# Patient Record
Sex: Male | Born: 1935
Health system: Southern US, Community
[De-identification: ages and names within clinical notes are randomized; demographics above are authoritative.]

## PROBLEM LIST (undated history)

## (undated) DIAGNOSIS — M1990 Unspecified osteoarthritis, unspecified site: Secondary | ICD-10-CM

## (undated) DIAGNOSIS — E782 Mixed hyperlipidemia: Secondary | ICD-10-CM

## (undated) DIAGNOSIS — G4730 Sleep apnea, unspecified: Secondary | ICD-10-CM

## (undated) DIAGNOSIS — C649 Malignant neoplasm of unspecified kidney, except renal pelvis: Secondary | ICD-10-CM

## (undated) DIAGNOSIS — C229 Malignant neoplasm of liver, not specified as primary or secondary: Secondary | ICD-10-CM

## (undated) DIAGNOSIS — M545 Low back pain: Secondary | ICD-10-CM

## (undated) DIAGNOSIS — J189 Pneumonia, unspecified organism: Secondary | ICD-10-CM

## (undated) DIAGNOSIS — H9193 Unspecified hearing loss, bilateral: Secondary | ICD-10-CM

## (undated) DIAGNOSIS — G8929 Other chronic pain: Secondary | ICD-10-CM

## (undated) HISTORY — DX: Malignant neoplasm of liver, not specified as primary or secondary: C22.9

## (undated) HISTORY — PX: COLONOSCOPY: SHX174

## (undated) HISTORY — DX: Mixed hyperlipidemia: E78.2

---

## 1978-07-15 HISTORY — PX: NEPHRECTOMY: SHX65

## 1979-06-19 DIAGNOSIS — C649 Malignant neoplasm of unspecified kidney, except renal pelvis: Secondary | ICD-10-CM

## 1979-06-19 DIAGNOSIS — Z85528 Personal history of other malignant neoplasm of kidney: Secondary | ICD-10-CM | POA: Insufficient documentation

## 1979-06-19 HISTORY — DX: Malignant neoplasm of unspecified kidney, except renal pelvis: C64.9

## 1998-06-09 ENCOUNTER — Ambulatory Visit (HOSPITAL_COMMUNITY): Admission: RE | Admit: 1998-06-09 | Discharge: 1998-06-09 | Payer: Self-pay | Admitting: Family Medicine

## 1998-06-09 ENCOUNTER — Encounter: Payer: Self-pay | Admitting: Family Medicine

## 1999-06-20 ENCOUNTER — Encounter: Payer: Self-pay | Admitting: Family Medicine

## 1999-06-20 ENCOUNTER — Ambulatory Visit (HOSPITAL_COMMUNITY): Admission: RE | Admit: 1999-06-20 | Discharge: 1999-06-20 | Payer: Self-pay | Admitting: Family Medicine

## 2000-06-17 ENCOUNTER — Ambulatory Visit (HOSPITAL_BASED_OUTPATIENT_CLINIC_OR_DEPARTMENT_OTHER): Admission: RE | Admit: 2000-06-17 | Discharge: 2000-06-17 | Payer: Self-pay | Admitting: Orthopedic Surgery

## 2000-11-14 ENCOUNTER — Ambulatory Visit (HOSPITAL_COMMUNITY): Admission: RE | Admit: 2000-11-14 | Discharge: 2000-11-14 | Payer: Self-pay | Admitting: Family Medicine

## 2000-11-14 ENCOUNTER — Encounter: Payer: Self-pay | Admitting: Family Medicine

## 2000-11-20 ENCOUNTER — Encounter: Payer: Self-pay | Admitting: Oral and Maxillofacial Surgery

## 2000-11-20 ENCOUNTER — Ambulatory Visit (HOSPITAL_COMMUNITY): Admission: RE | Admit: 2000-11-20 | Discharge: 2000-11-20 | Payer: Self-pay | Admitting: Oral and Maxillofacial Surgery

## 2002-02-04 ENCOUNTER — Encounter: Payer: Self-pay | Admitting: Urology

## 2002-02-04 ENCOUNTER — Encounter: Admission: RE | Admit: 2002-02-04 | Discharge: 2002-02-04 | Payer: Self-pay | Admitting: Urology

## 2002-05-13 ENCOUNTER — Encounter: Payer: Self-pay | Admitting: Family Medicine

## 2002-05-13 ENCOUNTER — Ambulatory Visit (HOSPITAL_COMMUNITY): Admission: RE | Admit: 2002-05-13 | Discharge: 2002-05-13 | Payer: Self-pay | Admitting: Family Medicine

## 2003-01-28 ENCOUNTER — Encounter: Payer: Self-pay | Admitting: Family Medicine

## 2003-01-28 ENCOUNTER — Ambulatory Visit (HOSPITAL_COMMUNITY): Admission: RE | Admit: 2003-01-28 | Discharge: 2003-01-28 | Payer: Self-pay | Admitting: Family Medicine

## 2003-05-31 ENCOUNTER — Ambulatory Visit (HOSPITAL_COMMUNITY): Admission: RE | Admit: 2003-05-31 | Discharge: 2003-05-31 | Payer: Self-pay | Admitting: Family Medicine

## 2004-10-01 ENCOUNTER — Ambulatory Visit (HOSPITAL_COMMUNITY): Admission: RE | Admit: 2004-10-01 | Discharge: 2004-10-01 | Payer: Self-pay | Admitting: Gastroenterology

## 2006-03-06 ENCOUNTER — Emergency Department (HOSPITAL_COMMUNITY): Admission: EM | Admit: 2006-03-06 | Discharge: 2006-03-06 | Payer: Self-pay | Admitting: Emergency Medicine

## 2007-03-10 ENCOUNTER — Ambulatory Visit (HOSPITAL_COMMUNITY): Admission: RE | Admit: 2007-03-10 | Discharge: 2007-03-10 | Payer: Self-pay | Admitting: Family Medicine

## 2007-06-18 HISTORY — PX: OPEN PARTIAL HEPATECTOMY [83]: SHX5987

## 2008-12-15 ENCOUNTER — Encounter: Admission: RE | Admit: 2008-12-15 | Discharge: 2008-12-15 | Payer: Self-pay | Admitting: Family Medicine

## 2009-04-27 ENCOUNTER — Encounter: Admission: RE | Admit: 2009-04-27 | Discharge: 2009-04-27 | Payer: Self-pay | Admitting: Orthopedic Surgery

## 2010-08-05 ENCOUNTER — Encounter: Payer: Self-pay | Admitting: Orthopedic Surgery

## 2010-08-29 HISTORY — PX: OPEN PARTIAL HEPATECTOMY [83]: SHX5987

## 2010-10-13 ENCOUNTER — Emergency Department (HOSPITAL_BASED_OUTPATIENT_CLINIC_OR_DEPARTMENT_OTHER)
Admission: EM | Admit: 2010-10-13 | Discharge: 2010-10-13 | Disposition: A | Discharge status: Home / Self Care | Payer: Medicare Other | Attending: Emergency Medicine | Admitting: Emergency Medicine

## 2010-10-13 DIAGNOSIS — E785 Hyperlipidemia, unspecified: Secondary | ICD-10-CM | POA: Insufficient documentation

## 2010-10-13 DIAGNOSIS — Z79899 Other long term (current) drug therapy: Secondary | ICD-10-CM | POA: Insufficient documentation

## 2010-10-13 DIAGNOSIS — H81399 Other peripheral vertigo, unspecified ear: Secondary | ICD-10-CM | POA: Insufficient documentation

## 2010-11-30 NOTE — Op Note (Signed)
NAMESHAVON, ZENZ              ACCOUNT NO.:  [id number]   MEDICAL RECORD NO.:  [id number]          PATIENT TYPE:  AMB   LOCATION:  ENDO                         FACILITY:  [facility]   PHYSICIAN:  John C. Madilyn Fireman, M.D.    DATE OF BIRTH:  1924/01/19   DATE OF PROCEDURE:  10/01/2004  DATE OF DISCHARGE:                                 OPERATIVE REPORT   PROCEDURE:  Colonoscopy.   INDICATIONS FOR PROCEDURE:  Average risk colon cancer screening.   DESCRIPTION OF PROCEDURE:  The patient was placed in the left lateral  decubitus position and placed on the pulse monitor with continuous low flow  oxygen delivered by nasal cannula.  He was sedated with 62.5 mcg of IV  Fentanyl and 8 mg of IV Versed.  The Olympus videocolonoscope was inserted  into the rectum and advanced to the cecum, confirmed by transillumination of  McBurney's point and visualization of the ileocecal valve and appendiceal  orifice.  The prep was good.  The cecum, ascending, transverse, descending  and sigmoid colon all appeared normal, with no masses, polyps, diverticula,  or other mucosal abnormalities.  The rectum, likewise, appeared normal.  Retroflex view of the anus revealed no obvious internal hemorrhoids.  The  scope was then withdrawn and the patient returned to the recovery room in  stable condition.  He tolerated the procedure well, and there were no  immediate complications.   IMPRESSION:  Normal colonoscopy.   PLAN:  Consider flexible sigmoidoscopy or Hemoccults in five years with  possible colonoscopy in 10 years.      [facility]MED[facility]  D:  10/01/2004  T:  10/01/2004  Job:  [id number]   cc:   Meredith Staggers, M.D.  [address]  [place]  [place] [address]  Fax: [phone]

## 2011-08-21 DIAGNOSIS — E7800 Pure hypercholesterolemia, unspecified: Secondary | ICD-10-CM | POA: Diagnosis not present

## 2011-10-18 DIAGNOSIS — C228 Malignant neoplasm of liver, primary, unspecified as to type: Secondary | ICD-10-CM | POA: Insufficient documentation

## 2011-10-18 DIAGNOSIS — R918 Other nonspecific abnormal finding of lung field: Secondary | ICD-10-CM | POA: Diagnosis not present

## 2011-11-27 DIAGNOSIS — H2510 Age-related nuclear cataract, unspecified eye: Secondary | ICD-10-CM | POA: Diagnosis not present

## 2011-11-27 DIAGNOSIS — H52209 Unspecified astigmatism, unspecified eye: Secondary | ICD-10-CM | POA: Diagnosis not present

## 2011-11-27 DIAGNOSIS — H524 Presbyopia: Secondary | ICD-10-CM | POA: Diagnosis not present

## 2011-11-27 DIAGNOSIS — H02409 Unspecified ptosis of unspecified eyelid: Secondary | ICD-10-CM | POA: Diagnosis not present

## 2012-02-03 DIAGNOSIS — M719 Bursopathy, unspecified: Secondary | ICD-10-CM | POA: Diagnosis not present

## 2012-04-24 DIAGNOSIS — I708 Atherosclerosis of other arteries: Secondary | ICD-10-CM | POA: Diagnosis not present

## 2012-04-24 DIAGNOSIS — Z905 Acquired absence of kidney: Secondary | ICD-10-CM | POA: Diagnosis not present

## 2012-04-24 DIAGNOSIS — C228 Malignant neoplasm of liver, primary, unspecified as to type: Secondary | ICD-10-CM | POA: Diagnosis not present

## 2012-04-24 DIAGNOSIS — R918 Other nonspecific abnormal finding of lung field: Secondary | ICD-10-CM | POA: Diagnosis not present

## 2012-06-17 DIAGNOSIS — Z23 Encounter for immunization: Secondary | ICD-10-CM | POA: Diagnosis not present

## 2012-06-19 DIAGNOSIS — N400 Enlarged prostate without lower urinary tract symptoms: Secondary | ICD-10-CM | POA: Diagnosis not present

## 2012-06-19 DIAGNOSIS — R351 Nocturia: Secondary | ICD-10-CM | POA: Diagnosis not present

## 2012-08-04 DIAGNOSIS — C229 Malignant neoplasm of liver, not specified as primary or secondary: Secondary | ICD-10-CM | POA: Diagnosis not present

## 2012-08-04 DIAGNOSIS — T6391XA Toxic effect of contact with unspecified venomous animal, accidental (unintentional), initial encounter: Secondary | ICD-10-CM | POA: Diagnosis not present

## 2012-08-04 DIAGNOSIS — E782 Mixed hyperlipidemia: Secondary | ICD-10-CM | POA: Diagnosis not present

## 2012-08-04 DIAGNOSIS — N400 Enlarged prostate without lower urinary tract symptoms: Secondary | ICD-10-CM | POA: Diagnosis not present

## 2012-08-19 DIAGNOSIS — M25579 Pain in unspecified ankle and joints of unspecified foot: Secondary | ICD-10-CM | POA: Diagnosis not present

## 2012-09-07 DIAGNOSIS — J069 Acute upper respiratory infection, unspecified: Secondary | ICD-10-CM | POA: Diagnosis not present

## 2012-09-12 DIAGNOSIS — J069 Acute upper respiratory infection, unspecified: Secondary | ICD-10-CM | POA: Diagnosis not present

## 2012-09-12 DIAGNOSIS — R05 Cough: Secondary | ICD-10-CM | POA: Diagnosis not present

## 2012-09-15 DIAGNOSIS — J209 Acute bronchitis, unspecified: Secondary | ICD-10-CM | POA: Diagnosis not present

## 2012-09-21 DIAGNOSIS — K5900 Constipation, unspecified: Secondary | ICD-10-CM | POA: Diagnosis not present

## 2012-09-21 DIAGNOSIS — J209 Acute bronchitis, unspecified: Secondary | ICD-10-CM | POA: Diagnosis not present

## 2012-10-01 DIAGNOSIS — J209 Acute bronchitis, unspecified: Secondary | ICD-10-CM | POA: Diagnosis not present

## 2012-10-01 DIAGNOSIS — R05 Cough: Secondary | ICD-10-CM | POA: Diagnosis not present

## 2012-10-15 DIAGNOSIS — M8430XA Stress fracture, unspecified site, initial encounter for fracture: Secondary | ICD-10-CM | POA: Diagnosis not present

## 2012-10-15 DIAGNOSIS — M19079 Primary osteoarthritis, unspecified ankle and foot: Secondary | ICD-10-CM | POA: Diagnosis not present

## 2012-10-20 DIAGNOSIS — M84376A Stress fracture, unspecified foot, initial encounter for fracture: Secondary | ICD-10-CM | POA: Diagnosis not present

## 2012-10-20 DIAGNOSIS — M25579 Pain in unspecified ankle and joints of unspecified foot: Secondary | ICD-10-CM | POA: Diagnosis not present

## 2012-10-26 ENCOUNTER — Telehealth: Payer: Self-pay | Admitting: Pulmonary Disease

## 2012-10-26 NOTE — Telephone Encounter (Signed)
Spoke with Crystal and ok to place pt in 2:15 sot for this Thursday in [place] with PW and block 2:45 for catch up. Pt is aware. appt set. Carron Curie, CMA

## 2012-10-29 ENCOUNTER — Encounter: Payer: Self-pay | Admitting: Critical Care Medicine

## 2012-10-29 ENCOUNTER — Ambulatory Visit (INDEPENDENT_AMBULATORY_CARE_PROVIDER_SITE_OTHER): Payer: Medicare Other | Admitting: Critical Care Medicine

## 2012-10-29 VITALS — BP 140/84 | HR 60 | Temp 98.1°F | Ht 69.0 in | Wt 150.5 lb

## 2012-10-29 DIAGNOSIS — C229 Malignant neoplasm of liver, not specified as primary or secondary: Secondary | ICD-10-CM | POA: Diagnosis not present

## 2012-10-29 DIAGNOSIS — R05 Cough: Secondary | ICD-10-CM | POA: Diagnosis not present

## 2012-10-29 DIAGNOSIS — R059 Cough, unspecified: Secondary | ICD-10-CM | POA: Insufficient documentation

## 2012-10-29 DIAGNOSIS — Z85528 Personal history of other malignant neoplasm of kidney: Secondary | ICD-10-CM

## 2012-10-29 DIAGNOSIS — E782 Mixed hyperlipidemia: Secondary | ICD-10-CM | POA: Insufficient documentation

## 2012-10-29 MED ORDER — BENZONATATE 100 MG PO CAPS: 100.0000 mg | ORAL_CAPSULE | Freq: Three times a day (TID) | ORAL | Status: DC | PRN

## 2012-10-29 MED ORDER — HYDROCOD POLST-CPM POLST ER 10-8 MG PO CP12: 1.0000 | ORAL_CAPSULE | Freq: Two times a day (BID) | ORAL | Status: DC | PRN

## 2012-10-29 MED ORDER — FLUTICASONE PROPIONATE 50 MCG/ACT NA SUSP: 2.0000 | Freq: Every day | NASAL | Status: DC

## 2012-10-29 MED ORDER — CHLORPHENIRAMINE TANNATE 12 MG PO TABS: 12.0000 mg | ORAL_TABLET | Freq: Every day | ORAL | Status: DC

## 2012-10-29 MED ORDER — PREDNISONE 10 MG PO TABS: ORAL_TABLET | ORAL | Status: DC

## 2012-10-29 NOTE — Progress Notes (Addendum)
Subjective:    Patient ID: Corey Davila, male    DOB: 05/14/1924, 79 y.o.   MRN: [id number]  HPI Comments: Started coughing 09/12/12.  Cough is productive of clear.    Cough This is a new problem. The current episode started more than 1 month ago. The problem has been unchanged. The cough is productive of sputum (clear mucus.). Associated symptoms include nasal congestion, postnasal drip and rhinorrhea. Pertinent negatives include no chest pain, chills, ear congestion, ear pain, fever, headaches, heartburn, myalgias, rash, sore throat, shortness of breath, sweats, weight loss or wheezing. He has tried prescription cough suppressant for the symptoms. The treatment provided moderate relief. There is no history of asthma, bronchiectasis, bronchitis, COPD, emphysema, environmental allergies or pneumonia.    Past Medical History  Diagnosis Date  . Liver cancer     partial liver resection 06/18/2007; partial liver resection 08/29/2010, not renal  . History of kidney cancer 06/19/1979    L renal taken out   . Mixed hyperlipidemia   . Cough      Family History  Problem Relation Age of Onset  . Stroke Father      History   Social History  . Marital Status: Married    Spouse Name: N/A    Number of Children: N/A  . Years of Education: N/A   Occupational History  . Retired     Solicitor for [employer]   Social History Main Topics  . Smoking status: Never Smoker   . Smokeless tobacco: Never Used  . Alcohol Use: Not on file  . Drug Use: Not on file  . Sexually Active: Not on file   Other Topics Concern  . Not on file   Social History Narrative  . No narrative on file     Allergies  Allergen Reactions  . Bee Venom   . Dilantin (Phenytoin)   . Mevacor (Lovastatin)   . Niacin And Related     Rash   . Septra Ds (Sulfamethoxazole W-Trimethoprim)      No outpatient prescriptions prior to visit.   No facility-administered medications prior to visit.      Review of  Systems  Constitutional: Positive for diaphoresis and activity change. Negative for fever, chills, weight loss, appetite change, fatigue and unexpected weight change.  HENT: Positive for congestion, rhinorrhea, sneezing, mouth sores, voice change and postnasal drip. Negative for hearing loss, ear pain, nosebleeds, sore throat, facial swelling, trouble swallowing, neck pain, neck stiffness, dental problem, sinus pressure, tinnitus and ear discharge.   Eyes: Negative for photophobia, discharge, itching and visual disturbance.  Respiratory: Positive for cough and choking. Negative for apnea, chest tightness, shortness of breath, wheezing and stridor.        Will choke with coughing  Cardiovascular: Negative for chest pain, palpitations and leg swelling.  Gastrointestinal: Negative for heartburn, nausea, vomiting, abdominal pain, constipation, blood in stool and abdominal distention.  Genitourinary: Negative for dysuria, urgency, frequency, hematuria, flank pain, decreased urine volume and difficulty urinating.  Musculoskeletal: Negative for myalgias, back pain, joint swelling, arthralgias and gait problem.  Skin: Negative for color change, pallor and rash.  Allergic/Immunologic: Negative for environmental allergies.  Neurological: Negative for dizziness, tremors, seizures, syncope, speech difficulty, weakness, light-headedness, numbness and headaches.  Hematological: Negative for adenopathy. Does not bruise/bleed easily.  Psychiatric/Behavioral: Positive for sleep disturbance. Negative for confusion and agitation. The patient is not nervous/anxious.        Objective:   Physical Exam Filed Vitals:   10/29/12  1419  BP: 140/84  Pulse: 60  Temp: 98.1 F (36.7 C)  TempSrc: Oral  Height: 5\' 9"  (1.753 m)  Weight: 150 lb 8 oz (68.266 kg)  SpO2: 94%    Gen: Pleasant, well-nourished, in no distress,  normal affect  ENT: No lesions,  mouth clear,  oropharynx clear, ++++ postnasal drip  Neck: No  JVD, no TMG, no carotid bruits  Lungs: No use of accessory muscles, no dullness to percussion, clear without rales or rhonchi  Cardiovascular: RRR, heart sounds normal, no murmur or gallops, no peripheral edema  Abdomen: soft and NT, no HSM,  BS normal  Musculoskeletal: No deformities, no cyanosis or clubbing  Neuro: alert, non focal  Skin: Warm, no lesions or rashes  No results found.   Cleda Daub normal 10/29/12     Assessment & Plan:   Cough Cyclical cough on basis of GERD and postnasal drip syndrome without any primary lung pathology. Note CXR neg for any metastatic disease with hx of renal CA an liver CA PLAN Take prednisone 10mg  Take 4 for two days three for two days two for two days one for two days Follow cough protocol with tussicaps and benzonatate Follow reflux diet No other medication changes Return 6 weeks     Updated Medication List Outpatient Encounter Prescriptions as of 10/29/2012  Medication Sig Dispense Refill  . CRESTOR 20 MG tablet Take 1 tablet by mouth every other day.      . docusate sodium (COLACE) 100 MG capsule Take 100 mg by mouth daily.      . finasteride (PROSCAR) 5 MG tablet Take 1 tablet by mouth daily.      . fluticasone (FLONASE) 50 MCG/ACT nasal spray Place 2 sprays into the nose at bedtime.  16 g  6  . Milk Thistle 250 MG CAPS Take 1 capsule by mouth daily.      . Misc Natural Products (OSTEO BI-FLEX JOINT SHIELD PO) Take 1 tablet by mouth 2 (two) times daily.      . Multiple Minerals-Vitamins (CALCIUM CITRATE PLUS/MAGNESIUM PO) Take 1 tablet by mouth daily.      . Multiple Vitamin (MULTIVITAMIN) tablet Take 1 tablet by mouth daily.      . [DISCONTINUED] dextromethorphan-guaiFENesin (MUCINEX DM) 30-600 MG per 12 hr tablet Take 1 tablet by mouth every 12 (twelve) hours.      . [DISCONTINUED] fluticasone (FLONASE) 50 MCG/ACT nasal spray Place 2 sprays into the nose at bedtime.      . benzonatate (TESSALON) 100 MG capsule Take 1 capsule (100 mg  total) by mouth 3 (three) times daily as needed for cough. Take 1 -2 every 4 hours as needed for cough  60 capsule  6  . Chlorpheniramine Tannate 12 MG TABS Take 1 tablet (12 mg total) by mouth at bedtime.  30 each  6  . Hydrocod Polst-Chlorphen Polst (TUSSICAPS) 10-8 MG CP12 Take 1 capsule by mouth 2 (two) times daily as needed.  30 each  1  . predniSONE (DELTASONE) 10 MG tablet Take 4 for two days three for two days two for two days one for two days  20 tablet  0   No facility-administered encounter medications on file as of 10/29/2012.

## 2012-10-29 NOTE — Patient Instructions (Addendum)
Take prednisone 10mg  Take 4 for two days three for two days two for two days one for two days Follow cough protocol with tussicaps and benzonatate Follow reflux diet No other medication changes We are trying to find your chest xray for review, it has not been seen as of this visit by Dr Delford Field Return 6 weeks

## 2012-10-30 NOTE — Assessment & Plan Note (Addendum)
Cyclical cough on basis of GERD and postnasal drip syndrome without any primary lung pathology. Note CXR neg for any metastatic disease with hx of renal CA an liver CA PLAN Take prednisone 10mg  Take 4 for two days three for two days two for two days one for two days Follow cough protocol with tussicaps and benzonatate Follow reflux diet No other medication changes Return 6 weeks

## 2012-11-02 ENCOUNTER — Telehealth: Payer: Self-pay | Admitting: Specialist

## 2012-11-02 ENCOUNTER — Telehealth: Payer: Self-pay | Admitting: Critical Care Medicine

## 2012-11-02 NOTE — Telephone Encounter (Signed)
Spoke with pt and his spouse They did not know which med to take first- the tussi caps or the tessalon I advised them to refer to the cyclical cough protocol sheet given at ov She verbalized understanding and nothing further needed They understood the sheet PW gave them

## 2012-11-02 NOTE — Telephone Encounter (Signed)
Made in error. Corey Davila  °

## 2012-11-03 ENCOUNTER — Institutional Professional Consult (permissible substitution): Payer: Medicare Other | Admitting: Pulmonary Disease

## 2012-11-10 DIAGNOSIS — C228 Malignant neoplasm of liver, primary, unspecified as to type: Secondary | ICD-10-CM | POA: Diagnosis not present

## 2012-11-10 DIAGNOSIS — Z0389 Encounter for observation for other suspected diseases and conditions ruled out: Secondary | ICD-10-CM | POA: Diagnosis not present

## 2012-11-10 DIAGNOSIS — R918 Other nonspecific abnormal finding of lung field: Secondary | ICD-10-CM | POA: Diagnosis not present

## 2012-11-26 ENCOUNTER — Encounter: Payer: Self-pay | Admitting: Critical Care Medicine

## 2012-11-26 ENCOUNTER — Ambulatory Visit (INDEPENDENT_AMBULATORY_CARE_PROVIDER_SITE_OTHER): Payer: Medicare Other | Admitting: Critical Care Medicine

## 2012-11-26 VITALS — BP 118/70 | HR 84 | Temp 100.7°F | Ht 69.0 in | Wt 149.0 lb

## 2012-11-26 DIAGNOSIS — R05 Cough: Secondary | ICD-10-CM

## 2012-11-26 MED ORDER — CHLORPHENIRAMINE TANNATE 12 MG PO TABS: ORAL_TABLET | ORAL | Status: DC

## 2012-11-26 MED ORDER — METHYLPREDNISOLONE ACETATE 80 MG/ML IJ SUSP
120.0000 mg | Freq: Once | INTRAMUSCULAR | Status: AC
  Administered 2012-11-26: 120 mg via INTRAMUSCULAR

## 2012-11-26 MED ORDER — HYDROCOD POLST-CPM POLST ER 10-8 MG PO CP12: 1.0000 | ORAL_CAPSULE | Freq: Two times a day (BID) | ORAL | Status: DC | PRN

## 2012-11-26 MED ORDER — BENZONATATE 100 MG PO CAPS: ORAL_CAPSULE | ORAL | Status: DC

## 2012-11-26 MED ORDER — FLUTICASONE PROPIONATE 50 MCG/ACT NA SUSP: NASAL | Status: DC

## 2012-11-26 NOTE — Patient Instructions (Addendum)
REsume cough protocol with tussicaps and tessalon perles Keep sugar free lozenge in mouth at all times No talking for two days, except emergency Resume chlorpheniramine and flonase>>do not stop A depomedrol injection was given All meds refilled to downstairs pharmacy Return 2 months

## 2012-11-26 NOTE — Assessment & Plan Note (Signed)
Cyclical cough on the basis of reflux disease and postnasal drip syndrome with acute allergic rhinitis No primary lung disease Plan REsume cough protocol with tussicaps and tessalon perles Keep sugar free lozenge in mouth at all times No talking for two days, except emergency Resume chlorpheniramine and flonase>>do not stop A depomedrol injection was given

## 2012-11-26 NOTE — Progress Notes (Signed)
Subjective:    Patient ID: Corey Davila, male    DOB: 08/24/1923, 79 y.o.   MRN: [id number]  HPI Comments: Started coughing 09/12/12.  Cough is productive of clear.    Cough This is a new problem. The current episode started more than 1 month ago. The problem has been gradually worsening. The cough is productive of sputum (clear mucus.). Associated symptoms include nasal congestion, postnasal drip and rhinorrhea. Pertinent negatives include no chest pain, chills, ear congestion, ear pain, fever, headaches, heartburn, hemoptysis, myalgias, rash, sore throat, shortness of breath, sweats, weight loss or wheezing. The symptoms are aggravated by lying down and pollens. He has tried prescription cough suppressant for the symptoms. The treatment provided moderate relief. There is no history of asthma, bronchiectasis, bronchitis, COPD, emphysema, environmental allergies or pneumonia.   11/26/2012 Cough was better with protocol then three days ago worse Now : cough is productive of phlegm.  Clear. No dyspnea. Notes qhs cough.  No heartburn    Past Medical History  Diagnosis Date  . Liver cancer     partial liver resection 06/18/2007; partial liver resection 08/29/2010, not renal  . History of kidney cancer 06/19/1979    L renal taken out   . Mixed hyperlipidemia   . Cough      Family History  Problem Relation Age of Onset  . Stroke Father      History   Social History  . Marital Status: Married    Spouse Name: N/A    Number of Children: N/A  . Years of Education: N/A   Occupational History  . Retired     Solicitor for [employer]   Social History Main Topics  . Smoking status: Never Smoker   . Smokeless tobacco: Never Used  . Alcohol Use: Not on file  . Drug Use: Not on file  . Sexually Active: Not on file   Other Topics Concern  . Not on file   Social History Narrative  . No narrative on file     Allergies  Allergen Reactions  . Bee Venom   . Dilantin (Phenytoin)    . Mevacor (Lovastatin)   . Niacin And Related     Rash   . Septra Ds (Sulfamethoxazole W-Trimethoprim)      Outpatient Prescriptions Prior to Visit  Medication Sig Dispense Refill  . CRESTOR 20 MG tablet Take 1 tablet by mouth every other day.      . docusate sodium (COLACE) 100 MG capsule Take 100 mg by mouth daily.      . finasteride (PROSCAR) 5 MG tablet Take 1 tablet by mouth daily.      . Milk Thistle 250 MG CAPS Take 1 capsule by mouth daily.      . Misc Natural Products (OSTEO BI-FLEX JOINT SHIELD PO) Take 1 tablet by mouth 2 (two) times daily.      . Multiple Minerals-Vitamins (CALCIUM CITRATE PLUS/MAGNESIUM PO) Take 1 tablet by mouth daily.      . Multiple Vitamin (MULTIVITAMIN) tablet Take 1 tablet by mouth daily.      . benzonatate (TESSALON) 100 MG capsule Take 1 capsule (100 mg total) by mouth 3 (three) times daily as needed for cough. Take 1 -2 every 4 hours as needed for cough  60 capsule  6  . Chlorpheniramine Tannate 12 MG TABS Take 1 tablet (12 mg total) by mouth at bedtime.  30 each  6  . fluticasone (FLONASE) 50 MCG/ACT nasal spray Place 2 sprays  into the nose at bedtime.  16 g  6  . Hydrocod Polst-Chlorphen Polst (TUSSICAPS) 10-8 MG CP12 Take 1 capsule by mouth 2 (two) times daily as needed.  30 each  1  . predniSONE (DELTASONE) 10 MG tablet Take 4 for two days three for two days two for two days one for two days  20 tablet  0   No facility-administered medications prior to visit.      Review of Systems  Constitutional: Positive for diaphoresis and activity change. Negative for fever, chills, weight loss, appetite change, fatigue and unexpected weight change.  HENT: Positive for congestion, rhinorrhea, sneezing, mouth sores, voice change and postnasal drip. Negative for hearing loss, ear pain, nosebleeds, sore throat, facial swelling, trouble swallowing, neck pain, neck stiffness, dental problem, sinus pressure, tinnitus and ear discharge.   Eyes: Negative for  photophobia, discharge, itching and visual disturbance.  Respiratory: Positive for cough and choking. Negative for apnea, hemoptysis, chest tightness, shortness of breath, wheezing and stridor.        Will choke with coughing  Cardiovascular: Negative for chest pain, palpitations and leg swelling.  Gastrointestinal: Negative for heartburn, nausea, vomiting, abdominal pain, constipation, blood in stool and abdominal distention.  Genitourinary: Negative for dysuria, urgency, frequency, hematuria, flank pain, decreased urine volume and difficulty urinating.  Musculoskeletal: Negative for myalgias, back pain, joint swelling, arthralgias and gait problem.  Skin: Negative for color change, pallor and rash.  Allergic/Immunologic: Negative for environmental allergies.  Neurological: Negative for dizziness, tremors, seizures, syncope, speech difficulty, weakness, light-headedness, numbness and headaches.  Hematological: Negative for adenopathy. Does not bruise/bleed easily.  Psychiatric/Behavioral: Positive for sleep disturbance. Negative for confusion and agitation. The patient is not nervous/anxious.        Objective:   Physical Exam  Filed Vitals:   11/26/12 1042  BP: 118/70  Pulse: 84  Temp: 100.7 F (38.2 C)  TempSrc: Oral  Height: 5\' 9"  (1.753 m)  Weight: 149 lb (67.586 kg)  SpO2: 96%    Gen: Pleasant, well-nourished, in no distress,  normal affect  ENT: No lesions,  mouth clear,  oropharynx clear, ++++ postnasal drip  Neck: No JVD, no TMG, no carotid bruits  Lungs: No use of accessory muscles, no dullness to percussion, clear without rales or rhonchi  Cardiovascular: RRR, heart sounds normal, no murmur or gallops, no peripheral edema  Abdomen: soft and NT, no HSM,  BS normal  Musculoskeletal: No deformities, no cyanosis or clubbing  Neuro: alert, non focal  Skin: Warm, no lesions or rashes  No results found.      Assessment & Plan:   Cough Cyclical cough on the  basis of reflux disease and postnasal drip syndrome with acute allergic rhinitis No primary lung disease Plan REsume cough protocol with tussicaps and tessalon perles Keep sugar free lozenge in mouth at all times No talking for two days, except emergency Resume chlorpheniramine and flonase>>do not stop A depomedrol injection was given     Updated Medication List Outpatient Encounter Prescriptions as of 11/26/2012  Medication Sig Dispense Refill  . CRESTOR 20 MG tablet Take 1 tablet by mouth every other day.      . desmopressin (DDAVP) 0.2 MG tablet Take 0.5 tablets by mouth at bedtime.      . docusate sodium (COLACE) 100 MG capsule Take 100 mg by mouth daily.      . finasteride (PROSCAR) 5 MG tablet Take 1 tablet by mouth daily.      . Milk Thistle 250  MG CAPS Take 1 capsule by mouth daily.      . Misc Natural Products (OSTEO BI-FLEX JOINT SHIELD PO) Take 1 tablet by mouth 2 (two) times daily.      . Multiple Minerals-Vitamins (CALCIUM CITRATE PLUS/MAGNESIUM PO) Take 1 tablet by mouth daily.      . Multiple Vitamin (MULTIVITAMIN) tablet Take 1 tablet by mouth daily.      . benzonatate (TESSALON) 100 MG capsule Take 1 -2 every 4 hours as needed for cough RESUME  60 capsule  6  . Chlorpheniramine Tannate 12 MG TABS RESUME  30 each  6  . fluticasone (FLONASE) 50 MCG/ACT nasal spray RESUME  16 g  6  . Hydrocod Polst-Chlorphen Polst (TUSSICAPS) 10-8 MG CP12 Take 1 capsule by mouth 2 (two) times daily as needed.  30 each  1  . [DISCONTINUED] benzonatate (TESSALON) 100 MG capsule Take 1 capsule (100 mg total) by mouth 3 (three) times daily as needed for cough. Take 1 -2 every 4 hours as needed for cough  60 capsule  6  . [DISCONTINUED] Chlorpheniramine Tannate 12 MG TABS Take 1 tablet (12 mg total) by mouth at bedtime.  30 each  6  . [DISCONTINUED] fluticasone (FLONASE) 50 MCG/ACT nasal spray Place 2 sprays into the nose at bedtime.  16 g  6  . [DISCONTINUED] Hydrocod Polst-Chlorphen Polst  (TUSSICAPS) 10-8 MG CP12 Take 1 capsule by mouth 2 (two) times daily as needed.  30 each  1  . [DISCONTINUED] predniSONE (DELTASONE) 10 MG tablet Take 4 for two days three for two days two for two days one for two days  20 tablet  0  . [EXPIRED] methylPREDNISolone acetate (DEPO-MEDROL) injection 120 mg        No facility-administered encounter medications on file as of 11/26/2012.

## 2012-12-01 ENCOUNTER — Telehealth: Payer: Self-pay | Admitting: Critical Care Medicine

## 2012-12-01 NOTE — Telephone Encounter (Signed)
Called spoke with Julianne Rice of PW's recs as stated below Kendal Hymen verbalized her understanding Ov scheduled w/ TP to regroup per PW on 5.22.14 @ 0930 Nothing further needed at this time Will sign and forward to PW as FYI on upcoming appt w/ TP

## 2012-12-01 NOTE — Telephone Encounter (Signed)
Pt's daughter returning call can be reached at [phone].Corey Davila

## 2012-12-01 NOTE — Telephone Encounter (Signed)
noted 

## 2012-12-01 NOTE — Telephone Encounter (Signed)
Called spoke with Kendal Hymen who reports that Fri 5.16.14 @ 5am pt was given 2 tessalon perles.  Pt got up again at 7am and took 2 more tessalon perles and had some notable dizziness afterward to where he had to sit down on the bathroom floor.  Tussicaps were also given that day according to the cough protocol.  Pt was "out of it Friday and Saturday" sleeping every couple of hours and having to use a walking stick d/t to weakness/dizziness/fuzziness.  Kendal Hymen stated that he is not coughing as much, though it is still a hard cough to where has to hug a pillow.  Pt only took 2 tessalon perles yesterday morning at 5am, stating that "he just can't do this" and does like how the medications make him feel.  Kendal Hymen denies pt having any HA, blurred vision, slurred speech, difficulty swallowing, CP.  Pt is eating well, 3x per day.  Dr Delford Field please advise, thanks! [facility] Allergies  Allergen Reactions  . Bee Venom   . Dilantin (Phenytoin)   . Mevacor (Lovastatin)   . Niacin And Related     Rash   . Septra Ds (Sulfamethoxazole W-Trimethoprim)

## 2012-12-01 NOTE — Telephone Encounter (Signed)
lmomtcb x1 for pt 

## 2012-12-01 NOTE — Telephone Encounter (Signed)
This is side effects d/t tessalon and tussicaps Stop both and do not restart We need ov to regroup with tammy parrett

## 2012-12-01 NOTE — Telephone Encounter (Signed)
If unable to reach Corey Davila--call other daughter Corey Davila--[phone]

## 2012-12-03 ENCOUNTER — Ambulatory Visit (INDEPENDENT_AMBULATORY_CARE_PROVIDER_SITE_OTHER): Payer: Medicare Other | Admitting: Critical Care Medicine

## 2012-12-03 ENCOUNTER — Ambulatory Visit (HOSPITAL_BASED_OUTPATIENT_CLINIC_OR_DEPARTMENT_OTHER)
Admission: RE | Admit: 2012-12-03 | Discharge: 2012-12-03 | Disposition: A | Discharge status: Home / Self Care | Payer: Medicare Other | Source: Ambulatory Visit | Attending: Critical Care Medicine | Admitting: Critical Care Medicine

## 2012-12-03 ENCOUNTER — Encounter: Payer: Self-pay | Admitting: Critical Care Medicine

## 2012-12-03 ENCOUNTER — Other Ambulatory Visit: Payer: Self-pay | Admitting: Critical Care Medicine

## 2012-12-03 ENCOUNTER — Ambulatory Visit: Payer: Medicare Other | Admitting: Adult Health

## 2012-12-03 VITALS — BP 142/86 | HR 77 | Temp 98.2°F | Ht 69.0 in | Wt 142.5 lb

## 2012-12-03 DIAGNOSIS — T17908A Unspecified foreign body in respiratory tract, part unspecified causing other injury, initial encounter: Secondary | ICD-10-CM

## 2012-12-03 DIAGNOSIS — C787 Secondary malignant neoplasm of liver and intrahepatic bile duct: Secondary | ICD-10-CM | POA: Insufficient documentation

## 2012-12-03 DIAGNOSIS — J690 Pneumonitis due to inhalation of food and vomit: Secondary | ICD-10-CM | POA: Insufficient documentation

## 2012-12-03 DIAGNOSIS — Z85528 Personal history of other malignant neoplasm of kidney: Secondary | ICD-10-CM | POA: Diagnosis not present

## 2012-12-03 DIAGNOSIS — R059 Cough, unspecified: Secondary | ICD-10-CM | POA: Insufficient documentation

## 2012-12-03 DIAGNOSIS — R05 Cough: Secondary | ICD-10-CM | POA: Diagnosis not present

## 2012-12-03 DIAGNOSIS — C229 Malignant neoplasm of liver, not specified as primary or secondary: Secondary | ICD-10-CM | POA: Diagnosis not present

## 2012-12-03 LAB — CBC WITH DIFFERENTIAL/PLATELET
Basophils Relative: 0 % (ref 0–1)
HCT: 40.5 % (ref 39.0–52.0)
Hemoglobin: 14 g/dL (ref 13.0–17.0)
MCHC: 34.6 g/dL (ref 30.0–36.0)
MCV: 94.6 fL (ref 78.0–100.0)
Monocytes Absolute: 1.2 10*3/uL — ABNORMAL HIGH (ref 0.1–1.0)
Monocytes Relative: 11 % (ref 3–12)
Neutro Abs: 8.5 10*3/uL — ABNORMAL HIGH (ref 1.7–7.7)

## 2012-12-03 MED ORDER — CEFUROXIME AXETIL 500 MG PO TABS: 500.0000 mg | ORAL_TABLET | Freq: Two times a day (BID) | ORAL | Status: DC

## 2012-12-03 MED ORDER — ALBUTEROL SULFATE HFA 108 (90 BASE) MCG/ACT IN AERS: INHALATION_SPRAY | RESPIRATORY_TRACT | Status: DC

## 2012-12-03 MED ORDER — FLUTTER DEVI: Status: DC

## 2012-12-03 NOTE — Assessment & Plan Note (Signed)
Cyclical cough on the basis of postnasal drip syndrome and reflux disease but adverse side effects do to narcotic based cough suppressants Plan will be to discontinue all further cough suppressants

## 2012-12-03 NOTE — Patient Instructions (Addendum)
Use flutter valve three times daily Take Ceftin twice daily for 10days Labs today STOP tussicaps, tessalon/benzonatate, chlorpheniramine Use albuterol inhaler two puff 3-4 times daily for 24 hours then as needed Return next Tuesday for recheck and Chest xray with tammy parrett in [place]

## 2012-12-03 NOTE — Progress Notes (Signed)
Subjective:    Patient ID: Corey Davila, male    DOB: 1923/07/25, 79 y.o.   MRN: [id number]  HPI Comments: Started coughing 09/12/12.  Cough is productive of clear.    12/03/2012 This patient was seen on May 14 with recurrent cough. He cyclic cough protocol resumed. The patient noted while on Tussi caps and benzonatate the patient became more drowsy and weak. The patient's appetite is poor. His secretion handling was difficult. There was increased nasal congestion. The mucus he is coughing up is clear. The cough itself is actually improved. He is having some night sweats. There is decreased endurance. There is no fever.  With history of liver cancer recent visit [facility] did not demonstrate recurrence of malignancy on CT scan.    Past Medical History  Diagnosis Date  . Liver cancer     partial liver resection 06/18/2007; partial liver resection 08/29/2010, not renal  . History of kidney cancer 06/19/1979    L renal taken out   . Mixed hyperlipidemia   . Cough      Family History  Problem Relation Age of Onset  . Stroke Father      History   Social History  . Marital Status: Married    Spouse Name: N/A    Number of Children: N/A  . Years of Education: N/A   Occupational History  . Retired     Solicitor for [employer]   Social History Main Topics  . Smoking status: Never Smoker   . Smokeless tobacco: Never Used  . Alcohol Use: Not on file  . Drug Use: Not on file  . Sexually Active: Not on file   Other Topics Concern  . Not on file   Social History Narrative  . No narrative on file     Allergies  Allergen Reactions  . Tessalon (Benzonatate)   . Tussicaps (Hydrocod Polst-Cpm Polst Er)   . Bee Venom   . Dilantin (Phenytoin)   . Mevacor (Lovastatin)   . Niacin And Related     Rash   . Septra Ds (Sulfamethoxazole W-Trimethoprim)      Outpatient Prescriptions Prior to Visit  Medication Sig Dispense Refill  . CRESTOR 20 MG tablet  Take 1 tablet by mouth every other day.      . desmopressin (DDAVP) 0.2 MG tablet Take 0.5 tablets by mouth at bedtime.      . docusate sodium (COLACE) 100 MG capsule Take 100 mg by mouth daily.      . finasteride (PROSCAR) 5 MG tablet Take 1 tablet by mouth daily.      . Milk Thistle 250 MG CAPS Take 1 capsule by mouth daily.      . Misc Natural Products (OSTEO BI-FLEX JOINT SHIELD PO) Take 1 tablet by mouth 2 (two) times daily.      . Multiple Minerals-Vitamins (CALCIUM CITRATE PLUS/MAGNESIUM PO) Take 1 tablet by mouth daily.      . Multiple Vitamin (MULTIVITAMIN) tablet Take 1 tablet by mouth daily.      . fluticasone (FLONASE) 50 MCG/ACT nasal spray RESUME  16 g  6  . benzonatate (TESSALON) 100 MG capsule Take 1 -2 every 4 hours as needed for cough RESUME  60 capsule  6  . Chlorpheniramine Tannate 12 MG TABS RESUME  30 each  6  . Hydrocod Polst-Chlorphen Polst (TUSSICAPS) 10-8 MG CP12 Take 1 capsule by mouth 2 (two) times daily as needed.  30 each  1  No facility-administered medications prior to visit.      Review of Systems  Constitutional: Positive for diaphoresis and activity change. Negative for appetite change, fatigue and unexpected weight change.  HENT: Positive for congestion, sneezing, mouth sores and voice change. Negative for hearing loss, nosebleeds, facial swelling, trouble swallowing, neck pain, neck stiffness, dental problem, sinus pressure, tinnitus and ear discharge.   Eyes: Negative for photophobia, discharge, itching and visual disturbance.  Respiratory: Positive for choking. Negative for apnea, chest tightness and stridor.        Will choke with coughing  Cardiovascular: Negative for palpitations and leg swelling.  Gastrointestinal: Negative for nausea, vomiting, abdominal pain, constipation, blood in stool and abdominal distention.  Genitourinary: Negative for dysuria, urgency, frequency, hematuria, flank pain, decreased urine volume and difficulty urinating.   Musculoskeletal: Negative for back pain, joint swelling, arthralgias and gait problem.  Skin: Negative for color change and pallor.  Neurological: Negative for dizziness, tremors, seizures, syncope, speech difficulty, weakness, light-headedness and numbness.  Hematological: Negative for adenopathy. Does not bruise/bleed easily.  Psychiatric/Behavioral: Positive for sleep disturbance. Negative for confusion and agitation. The patient is not nervous/anxious.        Objective:   Physical Exam  Filed Vitals:   12/03/12 0956  BP: 142/86  Pulse: 77  Temp: 98.2 F (36.8 C)  TempSrc: Oral  Height: 5\' 9"  (1.753 m)  Weight: 142 lb 8 oz (64.638 kg)  SpO2: 97%    Gen: Pleasant, well-nourished, in no distress,  normal affect  ENT: No lesions,  mouth clear,  oropharynx clear,               no postnasal drip  Neck: No JVD, no TMG, no carotid bruits  Lungs: No use of accessory muscles, no dullness to percussion, scattered right lower lobe rhonchi and rales  Cardiovascular: RRR, heart sounds normal, no murmur or gallops, no peripheral edema  Abdomen: soft and NT, no HSM,  BS normal  Musculoskeletal: No deformities, no cyanosis or clubbing  Neuro: alert, non focal  Skin: Warm, no lesions or rashes  Dg Chest 2 View  12/03/2012   *RADIOLOGY REPORT*  Clinical Data: Cough, liver cancer  CHEST - 2 VIEW  Comparison: 09/15/2012  Findings: Cardiomediastinal silhouette is stable.  Hyperinflation again noted. There is streaky basilar atelectasis or infiltrate. No pulmonary edema. Bony thorax is unremarkable.   IMPRESSION: Hyperinflation.  Streaky basilar atelectasis or infiltrate.  No pulmonary edema.   Original Report Authenticated By: Natasha Mead, M.D.        Assessment & Plan:   Aspiration pneumonia Right lower lobe aspiration pneumonia likely brought on by use of narcotic cough suppressant Plan Use flutter valve three times daily Take Ceftin twice daily for 10days Labs today STOP  tussicaps, tessalon/benzonatate, chlorpheniramine Use albuterol inhaler two puff 3-4 times daily for 24 hours then as needed Return next Tuesday for recheck and Chest xray with tammy parrett in [place]   Cough Cyclical cough on the basis of postnasal drip syndrome and reflux disease but adverse side effects do to narcotic based cough suppressants Plan will be to discontinue all further cough suppressants    Updated Medication List Outpatient Encounter Prescriptions as of 12/03/2012  Medication Sig Dispense Refill  . CRESTOR 20 MG tablet Take 1 tablet by mouth every other day.      . desmopressin (DDAVP) 0.2 MG tablet Take 0.5 tablets by mouth at bedtime.      . docusate sodium (COLACE) 100 MG capsule Take  100 mg by mouth daily.      . finasteride (PROSCAR) 5 MG tablet Take 1 tablet by mouth daily.      . Milk Thistle 250 MG CAPS Take 1 capsule by mouth daily.      . Misc Natural Products (OSTEO BI-FLEX JOINT SHIELD PO) Take 1 tablet by mouth 2 (two) times daily.      . Multiple Minerals-Vitamins (CALCIUM CITRATE PLUS/MAGNESIUM PO) Take 1 tablet by mouth daily.      . Multiple Vitamin (MULTIVITAMIN) tablet Take 1 tablet by mouth daily.      Marland Kitchen albuterol (PROVENTIL HFA;VENTOLIN HFA) 108 (90 BASE) MCG/ACT inhaler Use two puff 4 times daily for 24hrs then as needed for shortness of breath or mucus  1 Inhaler  0  . cefUROXime (CEFTIN) 500 MG tablet Take 1 tablet (500 mg total) by mouth 2 (two) times daily.  20 tablet  0  . fluticasone (FLONASE) 50 MCG/ACT nasal spray RESUME  16 g  6  . Respiratory Therapy Supplies (FLUTTER) DEVI Use three times daily  1 each  0  . [DISCONTINUED] benzonatate (TESSALON) 100 MG capsule Take 1 -2 every 4 hours as needed for cough RESUME  60 capsule  6  . [DISCONTINUED] Chlorpheniramine Tannate 12 MG TABS RESUME  30 each  6  . [DISCONTINUED] Hydrocod Polst-Chlorphen Polst (TUSSICAPS) 10-8 MG CP12 Take 1 capsule by mouth 2 (two) times daily as needed.  30 each  1    No facility-administered encounter medications on file as of 12/03/2012.

## 2012-12-03 NOTE — Assessment & Plan Note (Addendum)
Right lower lobe aspiration pneumonia likely brought on by use of narcotic cough suppressant Plan Use flutter valve three times daily Take Ceftin twice daily for 10days Labs today STOP tussicaps, tessalon/benzonatate, chlorpheniramine Use albuterol inhaler two puff 3-4 times daily for 24 hours then as needed Return next Tuesday for recheck and Chest xray with tammy parrett in [place]

## 2012-12-04 ENCOUNTER — Telehealth: Payer: Self-pay | Admitting: Critical Care Medicine

## 2012-12-04 DIAGNOSIS — J690 Pneumonitis due to inhalation of food and vomit: Secondary | ICD-10-CM | POA: Diagnosis not present

## 2012-12-04 DIAGNOSIS — Z85528 Personal history of other malignant neoplasm of kidney: Secondary | ICD-10-CM | POA: Diagnosis not present

## 2012-12-04 DIAGNOSIS — R05 Cough: Secondary | ICD-10-CM | POA: Diagnosis not present

## 2012-12-04 DIAGNOSIS — Z8505 Personal history of malignant neoplasm of liver: Secondary | ICD-10-CM | POA: Diagnosis not present

## 2012-12-04 DIAGNOSIS — E785 Hyperlipidemia, unspecified: Secondary | ICD-10-CM | POA: Diagnosis not present

## 2012-12-04 LAB — COMPREHENSIVE METABOLIC PANEL
Albumin: 3.2 g/dL — ABNORMAL LOW (ref 3.5–5.2)
Alkaline Phosphatase: 90 U/L (ref 39–117)
BUN: 19 mg/dL (ref 6–23)
Calcium: 9.2 mg/dL (ref 8.4–10.5)
Chloride: 100 mEq/L (ref 96–112)
Glucose, Bld: 116 mg/dL — ABNORMAL HIGH (ref 70–99)
Potassium: 4.5 mEq/L (ref 3.5–5.3)

## 2012-12-04 NOTE — Telephone Encounter (Signed)
I spoke with the pt daughter and she states she wanted to check on order sent for [facility]. I called and spoke with Misty Stanley at [facility] [employer] and she states she has the order and will contact them now. Carron Curie, CMA

## 2012-12-04 NOTE — Telephone Encounter (Signed)
lmomtcb x1 

## 2012-12-08 ENCOUNTER — Ambulatory Visit (HOSPITAL_BASED_OUTPATIENT_CLINIC_OR_DEPARTMENT_OTHER)
Admission: RE | Admit: 2012-12-08 | Discharge: 2012-12-08 | Disposition: A | Discharge status: Home / Self Care | Payer: Medicare Other | Source: Ambulatory Visit | Attending: Adult Health | Admitting: Adult Health

## 2012-12-08 ENCOUNTER — Encounter: Payer: Self-pay | Admitting: Adult Health

## 2012-12-08 ENCOUNTER — Ambulatory Visit (INDEPENDENT_AMBULATORY_CARE_PROVIDER_SITE_OTHER): Payer: Medicare Other | Admitting: Adult Health

## 2012-12-08 VITALS — BP 104/64 | HR 62 | Temp 98.1°F | Ht 69.0 in | Wt 143.0 lb

## 2012-12-08 DIAGNOSIS — R918 Other nonspecific abnormal finding of lung field: Secondary | ICD-10-CM | POA: Insufficient documentation

## 2012-12-08 DIAGNOSIS — R05 Cough: Secondary | ICD-10-CM | POA: Diagnosis not present

## 2012-12-08 DIAGNOSIS — J690 Pneumonitis due to inhalation of food and vomit: Secondary | ICD-10-CM | POA: Diagnosis not present

## 2012-12-08 DIAGNOSIS — R059 Cough, unspecified: Secondary | ICD-10-CM | POA: Diagnosis not present

## 2012-12-08 NOTE — Assessment & Plan Note (Signed)
Clinically improving -slowly  cxr today with no sign change in RLL infiltrate .   Plan  Finish Ceftin as directed  May use Flutter valve 2-3 times daily As needed  For thick mucus/congestion  Robitussin DM As needed  Cough  Albuterol Inhaler 1-2 puffs every 6 hr As needed  Wheezing/shortness of breath.  Fluids and rest  Avoid overheating , prolonged sun exposure.  follow up Dr. Delford Field  In 2 weeks in [place] with Chest xray  Please contact office for sooner follow up if symptoms do not improve or worsen or seek emergency care

## 2012-12-08 NOTE — Patient Instructions (Addendum)
Finish Ceftin as directed  May use Flutter valve 2-3 times daily As needed  For thick mucus/congestion  Robitussin DM As needed  Cough  Albuterol Inhaler 1-2 puffs every 6 hr As needed  Wheezing/shortness of breath.  Fluids and rest  Avoid overheating , prolonged sun exposure.  follow up Dr. Delford Field  In 2 weeks in [place] with Chest xray  Please contact office for sooner follow up if symptoms do not improve or worsen or seek emergency care

## 2012-12-08 NOTE — Progress Notes (Signed)
Subjective:    Patient ID: Corey Davila, male    DOB: 12-16-1923, 79 y.o.   MRN: [id number]  79 yo male, never smoker,  followed for recurrent cough ?GERD/AR triggers . > IOV Dr. Delford Field  10/29/12  >Spiro 10/29/12: FeV1 101% FVC 112%  FeV1/FVC 67% >Hx of Liver Cancer s/p resection 2008/2012   12/03/2012 This patient was seen on May 14 with recurrent cough. He cyclic cough protocol resumed. The patient noted while on Tussi caps and benzonatate the patient became more drowsy and weak. The patient's appetite is poor. His secretion handling was difficult. There was increased nasal congestion. The mucus he is coughing up is clear. The cough itself is actually improved. He is having some night sweats. There is decreased endurance. There is no fever.  With history of liver cancer recent visit [facility] did not demonstrate recurrence of malignancy on CT scan. >>suspected RLL PNA ? Aspiration >ceftin x 10 d   12/08/2012 Follow up  Pt returns for 5 d follow up for RLL PNA -suspected aspiration PNA . He was seen in office last week with increased cough/congesiton and poor appetite  CXR showed RLL atx vs infiltrate. He uses cough suppressants to help with recurrent cough -felt narcotics could have contributed to silent aspiration event.  He was started on Ceftin x 10.   Since last ov pt is feeling better but still very weak.  Has low energy. Started back walking short distances yesterday.  Having night sweats.  Cough is some better, using flutter valve .  Tolerating Ceftin without known difficulty .  Home health nurse ordered for weekend, did not come.  CXR today shows no sign change in Mild right lower lobe opacity Appetite is improved. Drinking lots of fluids. No n/v/d.  He denies chest pain, hemoptyiss  Accompanied by daughters at today's visit w/ multiple questions answered.     Review of Systems  Constitutional:   No  weight loss,  +night sweats,  + fatigue, or   lassitude.  HEENT:   No headaches,  Difficulty swallowing,  Tooth/dental problems, or  Sore throat,                No sneezing, itching, ear ache, nasal congestion, post nasal drip,   CV:  No chest pain,  Orthopnea, PND, swelling in lower extremities, anasarca, dizziness, palpitations, syncope.   GI  No heartburn, indigestion, abdominal pain, nausea, vomiting, diarrhea, change in bowel habits, loss of appetite, bloody stools.   Resp:    No coughing up of blood.  No change in color of mucus.  No wheezing.  No chest wall deformity  Skin: no rash or lesions.  GU: no dysuria, change in color of urine, no urgency or frequency.  No flank pain, no hematuria   MS:  No joint pain or swelling.  No decreased range of motion.  No back pain.  Psych:  No change in mood or affect. No depression or anxiety.  No memory loss.          Objective:   Physical Exam  Gen: Pleasant, well-nourished, in no distress,  normal affect  ENT: No lesions,  mouth clear,  oropharynx clear,               no postnasal drip  Neck: No JVD, no TMG, no carotid bruits  Lungs: No use of accessory muscles, no dullness to percussion, scattered right lower lobe rhonchi and rales  Cardiovascular: RRR, heart sounds normal, no  murmur or gallops, no peripheral edema  Abdomen: soft and NT, no HSM,  BS normal  Musculoskeletal: No deformities, no cyanosis or clubbing  Neuro: alert, non focal  Skin: Warm, no lesions or rashes  Dg Chest 2 View  12/03/2012   *RADIOLOGY REPORT*  Clinical Data: Cough, liver cancer  CHEST - 2 VIEW  Comparison: 09/15/2012  Findings: Cardiomediastinal silhouette is stable.  Hyperinflation again noted. There is streaky basilar atelectasis or infiltrate. No pulmonary edema. Bony thorax is unremarkable.   IMPRESSION: Hyperinflation.  Streaky basilar atelectasis or infiltrate.  No pulmonary edema.   Original Report Authenticated By: Natasha Mead, M.D.     12/08/2012 CXR>> Mild right lower lobe  opacity, grossly unchanged, suspicious for pneumonia.   Assessment & Plan:

## 2012-12-09 ENCOUNTER — Telehealth: Payer: Self-pay | Admitting: Critical Care Medicine

## 2012-12-09 NOTE — Telephone Encounter (Signed)
Spoke pt's daughter [facility] order was to be cancelled b/c they had wanted the [facility] to only come out on Fri 5.23 and Sat 5.24 HHN at pt's home now and Eleen wants to know "what to do with her" >> placed Eleen on hold to speak with TP Per TP: ok to send the nurse away and why was the order not sent yesterday??? Eleen to send the nurse away; will call [facility] to cancel the order  [facility], spoke with clinical manager Twanna Hy thinks there was a miscommunication/minsunderstanding on what Home Health entails - were they wanting a sitter for the 2 days?  Beth reports the patient "was pretty sick" HH order cancelled verbally with Beth and she requesting to call the daughter to see where exactly the miscommunication was  Beth TCB w/ update Will hold in my inbasket

## 2012-12-10 ENCOUNTER — Telehealth: Payer: Self-pay | Admitting: Critical Care Medicine

## 2012-12-10 ENCOUNTER — Ambulatory Visit: Payer: Medicare Other | Admitting: Critical Care Medicine

## 2012-12-10 NOTE — Telephone Encounter (Signed)
LMTCB

## 2012-12-10 NOTE — Telephone Encounter (Signed)
I spoke with Kendal Hymen and notified of recs per PW She states that she will call back in the am and let us know Will hold in triage

## 2012-12-10 NOTE — Telephone Encounter (Signed)
LMOM TCB x1 for Corey Davila to follow up from yesterday

## 2012-12-10 NOTE — Telephone Encounter (Signed)
I spoke with the pt's daughter Marjean Donna  She states that she is concerned that the pt has not received lab results- in Epic it states per PW that the pt is aware of results but daughter states that he is not She states that the pt is still very weak, and for the past 2 days his eyes and legs appear yellow in color She is asking for labs and what do do next She can not answer her phone anymore today, and wants [place] to call her other sister Kendal Hymen at [phone] with response Please advise thanks!

## 2012-12-10 NOTE — Telephone Encounter (Signed)
Labs from 12/03/12 did not show any signs of liver dysfunction to suggest jaundice  I am happy to admit this pt to hospital electively in AM?  Or he can go to ED for evaluation tonite

## 2012-12-11 NOTE — Telephone Encounter (Signed)
Corey Davila from [employer] says she's returning a call she can be reached at [phone].Corey Davila

## 2012-12-11 NOTE — Telephone Encounter (Signed)
LMTCB

## 2012-12-14 NOTE — Telephone Encounter (Signed)
Pt's daughter, Marjean Donna, is returning the nurse's call.  Marjean Donna states to leave a detailed message if we call back.  Marjean Donna states she does not understand why she is getting a call from [place] at all.  Very passive aggressive.  Antionette Fairy

## 2012-12-14 NOTE — Telephone Encounter (Signed)
ATC Marjean Donna regarding patients status (jaundice)-- no answer LMOMTCB   Spoke with Waynetta Sandy, Beth inquiring if we knew exactly where the communication went wrong with patients daughter-- I have informed Beth that per telephone note 12/09/12 it appears the daughter simply did not want HHN out. Beth verbalized understanding and nothing further needed at this time from her.

## 2012-12-14 NOTE — Telephone Encounter (Signed)
LMOM explaining that the reason that we will tried calling was to check the status of how her Father is doing I advised will close encounter and she can call back if needed

## 2012-12-14 NOTE — Telephone Encounter (Signed)
ATC patients daughter-- no answer LMOMTCB

## 2012-12-16 NOTE — Telephone Encounter (Signed)
Same message from 12/10/12. Will close message

## 2012-12-31 ENCOUNTER — Encounter: Payer: Self-pay | Admitting: Critical Care Medicine

## 2012-12-31 ENCOUNTER — Ambulatory Visit (INDEPENDENT_AMBULATORY_CARE_PROVIDER_SITE_OTHER): Payer: Medicare Other | Admitting: Critical Care Medicine

## 2012-12-31 ENCOUNTER — Ambulatory Visit (HOSPITAL_BASED_OUTPATIENT_CLINIC_OR_DEPARTMENT_OTHER)
Admission: RE | Admit: 2012-12-31 | Discharge: 2012-12-31 | Disposition: A | Discharge status: Home / Self Care | Payer: Medicare Other | Source: Ambulatory Visit | Attending: Critical Care Medicine | Admitting: Critical Care Medicine

## 2012-12-31 VITALS — BP 110/70 | HR 67 | Temp 98.1°F | Ht 69.0 in | Wt 143.0 lb

## 2012-12-31 DIAGNOSIS — Z09 Encounter for follow-up examination after completed treatment for conditions other than malignant neoplasm: Secondary | ICD-10-CM | POA: Diagnosis not present

## 2012-12-31 DIAGNOSIS — J690 Pneumonitis due to inhalation of food and vomit: Secondary | ICD-10-CM

## 2012-12-31 DIAGNOSIS — J189 Pneumonia, unspecified organism: Secondary | ICD-10-CM | POA: Insufficient documentation

## 2012-12-31 DIAGNOSIS — R05 Cough: Secondary | ICD-10-CM

## 2012-12-31 NOTE — Patient Instructions (Addendum)
Return as needed

## 2012-12-31 NOTE — Progress Notes (Signed)
Subjective:    Patient ID: Corey Davila, male    DOB: 1924/06/05, 79 y.o.   MRN: [id number]  HPI 79 yo male, never smoker,  followed for recurrent cough ?GERD/AR triggers .  12/31/2012 Chief Complaint  Patient presents with  . 2 wk follow up    with cxr.  Feels better, and cough has improved.  Does have an occas dry cough.  No SOB, wheezing, chest tightness, or chest pain at this time.  Overall much improved. No further nausea, cough or emesis Pt denies any significant sore throat, nasal congestion or excess secretions, fever, chills, sweats, unintended weight loss, pleurtic or exertional chest pain, orthopnea PND, or leg swelling Pt denies any increase in rescue therapy over baseline, denies waking up needing it or having any early am or nocturnal exacerbations of coughing/wheezing/or dyspnea. Pt also denies any obvious fluctuation in symptoms with  weather or environmental change or other alleviating or aggravating factors   Review of Systems Constitutional:   No  weight loss, night sweats,  Fevers, chills, fatigue, lassitude. HEENT:   No headaches,  Difficulty swallowing,  Tooth/dental problems,  Sore throat,                No sneezing, itching, ear ache, nasal congestion, post nasal drip,   CV:  No chest pain,  Orthopnea, PND, swelling in lower extremities, anasarca, dizziness, palpitations  GI  No heartburn, indigestion, abdominal pain, nausea, vomiting, diarrhea, change in bowel habits, loss of appetite  Resp: No shortness of breath with exertion or at rest.  No excess mucus, no productive cough,  No non-productive cough,  No coughing up of blood.  No change in color of mucus.  No wheezing.  No chest wall deformity  Skin: no rash or lesions.  GU: no dysuria, change in color of urine, no urgency or frequency.  No flank pain.  MS:  No joint pain or swelling.  No decreased range of motion.  No back pain.  Psych:  No change in mood or affect. No depression or anxiety.  No  memory loss.     Objective:   Physical Exam Filed Vitals:   12/31/12 1155  BP: 110/70  Pulse: 67  Temp: 98.1 F (36.7 C)  TempSrc: Oral  Height: 5\' 9"  (1.753 m)  Weight: 64.864 kg (143 lb)  SpO2: 97%    Gen: Pleasant, well-nourished, in no distress,  normal affect  ENT: No lesions,  mouth clear,  oropharynx clear, no postnasal drip  Neck: No JVD, no TMG, no carotid bruits  Lungs: No use of accessory muscles, no dullness to percussion, clear without rales or rhonchi  Cardiovascular: RRR, heart sounds normal, no murmur or gallops, no peripheral edema  Abdomen: soft and NT, no HSM,  BS normal  Musculoskeletal: No deformities, no cyanosis or clubbing  Neuro: alert, non focal  Skin: Warm, no lesions or rashes  Dg Chest 2 View  12/31/2012   *RADIOLOGY REPORT*  Clinical Data: Follow up pneumonia  CHEST - 2 VIEW  Comparison: 12/08/2012  Findings: Right lower lobe infiltrate has cleared.  Lungs are now free of infiltrate or effusion.  There is underlying hyperinflation of lungs.  No heart failure or mass lesion.  IMPRESSION: No acute abnormality.   Original Report Authenticated By: Janeece Riggers, M.D.          Assessment & Plan:   Cough Cyclical cough on basis of GERD and postnasal drip. No primary lung disease Spiro 10/29/12: FeV1 101% FVC 112%  FeV1/FVC 67% Improved and resolved Plan Avoid benzonatate and hydrocodone based cough med in the future Return prn  Aspiration pneumonia RLL asp PNA>>resolved on CXR 12/31/12   Updated Medication List Outpatient Encounter Prescriptions as of 12/31/2012  Medication Sig Dispense Refill  . albuterol (PROVENTIL HFA;VENTOLIN HFA) 108 (90 BASE) MCG/ACT inhaler Use two puff 4 times daily for 24hrs then as needed for shortness of breath or mucus  1 Inhaler  0  . CRESTOR 20 MG tablet Take 1 tablet by mouth every other day.      . desmopressin (DDAVP) 0.2 MG tablet Take 0.5 tablets by mouth at bedtime.      . docusate sodium (COLACE)  100 MG capsule Take 100 mg by mouth daily.      . finasteride (PROSCAR) 5 MG tablet Take 1 tablet by mouth daily.      Marland Kitchen L-Methylfolate-B6-B12 (FOLTX) 1.13-25-2 MG TABS Take 1 tablet by mouth daily.      . Milk Thistle 250 MG CAPS Take 1 capsule by mouth daily.      . Misc Natural Products (OSTEO BI-FLEX JOINT SHIELD PO) Take 1 tablet by mouth 2 (two) times daily.      . Multiple Minerals-Vitamins (CALCIUM CITRATE PLUS/MAGNESIUM PO) Take 1 tablet by mouth daily.      . Multiple Vitamin (MULTIVITAMIN) tablet Take 1 tablet by mouth daily.      Marland Kitchen Respiratory Therapy Supplies (FLUTTER) DEVI Use 1-2 times daily      . [DISCONTINUED] Respiratory Therapy Supplies (FLUTTER) DEVI Use three times daily  1 each  0  . fluticasone (FLONASE) 50 MCG/ACT nasal spray RESUME  16 g  6  . [DISCONTINUED] cefUROXime (CEFTIN) 500 MG tablet Take 1 tablet (500 mg total) by mouth 2 (two) times daily.  20 tablet  0   No facility-administered encounter medications on file as of 12/31/2012.

## 2013-01-01 NOTE — Assessment & Plan Note (Signed)
RLL asp PNA>>resolved on CXR 12/31/12

## 2013-01-01 NOTE — Assessment & Plan Note (Signed)
Cyclical cough on basis of GERD and postnasal drip. No primary lung disease Cleda Daub 10/29/12: FeV1 101% FVC 112%  FeV1/FVC 67% Improved and resolved Plan Avoid benzonatate and hydrocodone based cough med in the future Return prn

## 2013-01-04 DIAGNOSIS — H01009 Unspecified blepharitis unspecified eye, unspecified eyelid: Secondary | ICD-10-CM | POA: Diagnosis not present

## 2013-01-04 DIAGNOSIS — H2510 Age-related nuclear cataract, unspecified eye: Secondary | ICD-10-CM | POA: Diagnosis not present

## 2013-01-04 DIAGNOSIS — H52209 Unspecified astigmatism, unspecified eye: Secondary | ICD-10-CM | POA: Diagnosis not present

## 2013-01-13 DIAGNOSIS — C44319 Basal cell carcinoma of skin of other parts of face: Secondary | ICD-10-CM | POA: Diagnosis not present

## 2013-01-28 ENCOUNTER — Ambulatory Visit: Payer: Medicare Other | Admitting: Critical Care Medicine

## 2013-02-03 DIAGNOSIS — Z85828 Personal history of other malignant neoplasm of skin: Secondary | ICD-10-CM | POA: Diagnosis not present

## 2013-02-03 DIAGNOSIS — L570 Actinic keratosis: Secondary | ICD-10-CM | POA: Diagnosis not present

## 2013-02-05 DIAGNOSIS — H11829 Conjunctivochalasis, unspecified eye: Secondary | ICD-10-CM | POA: Diagnosis not present

## 2013-02-05 DIAGNOSIS — H01009 Unspecified blepharitis unspecified eye, unspecified eyelid: Secondary | ICD-10-CM | POA: Diagnosis not present

## 2013-02-15 DIAGNOSIS — H11829 Conjunctivochalasis, unspecified eye: Secondary | ICD-10-CM | POA: Diagnosis not present

## 2013-04-18 DIAGNOSIS — Z23 Encounter for immunization: Secondary | ICD-10-CM | POA: Diagnosis not present

## 2013-05-18 DIAGNOSIS — Z9889 Other specified postprocedural states: Secondary | ICD-10-CM | POA: Diagnosis not present

## 2013-05-18 DIAGNOSIS — C228 Malignant neoplasm of liver, primary, unspecified as to type: Secondary | ICD-10-CM | POA: Diagnosis not present

## 2013-05-18 DIAGNOSIS — Z905 Acquired absence of kidney: Secondary | ICD-10-CM | POA: Diagnosis not present

## 2013-05-18 DIAGNOSIS — Z9089 Acquired absence of other organs: Secondary | ICD-10-CM | POA: Diagnosis not present

## 2013-05-18 DIAGNOSIS — R918 Other nonspecific abnormal finding of lung field: Secondary | ICD-10-CM | POA: Diagnosis not present

## 2013-05-27 ENCOUNTER — Telehealth: Payer: Self-pay | Admitting: Critical Care Medicine

## 2013-05-27 NOTE — Telephone Encounter (Signed)
Pt was placed on injection schedule. Nothing further needed

## 2013-05-27 NOTE — Telephone Encounter (Signed)
I spoke with pt. He reports he went to see PCP and was advised to get his PNA vaccine through Dr. Delford Field. Per pt his last PNA vaccine was over 5 years ago. Please advise Dr. Delford Field thanks

## 2013-05-27 NOTE — Telephone Encounter (Signed)
He is eligible and should receive Prevnar 13

## 2013-05-28 ENCOUNTER — Ambulatory Visit: Payer: Medicare Other

## 2013-05-31 ENCOUNTER — Ambulatory Visit (INDEPENDENT_AMBULATORY_CARE_PROVIDER_SITE_OTHER): Payer: Medicare Other

## 2013-05-31 DIAGNOSIS — Z23 Encounter for immunization: Secondary | ICD-10-CM

## 2013-06-05 DIAGNOSIS — R296 Repeated falls: Secondary | ICD-10-CM | POA: Diagnosis not present

## 2013-06-05 DIAGNOSIS — S62109A Fracture of unspecified carpal bone, unspecified wrist, initial encounter for closed fracture: Secondary | ICD-10-CM | POA: Diagnosis not present

## 2013-06-05 DIAGNOSIS — M25539 Pain in unspecified wrist: Secondary | ICD-10-CM | POA: Diagnosis not present

## 2013-06-05 DIAGNOSIS — S52599A Other fractures of lower end of unspecified radius, initial encounter for closed fracture: Secondary | ICD-10-CM | POA: Diagnosis not present

## 2013-06-09 DIAGNOSIS — S52599A Other fractures of lower end of unspecified radius, initial encounter for closed fracture: Secondary | ICD-10-CM | POA: Diagnosis not present

## 2013-06-16 DIAGNOSIS — S52599A Other fractures of lower end of unspecified radius, initial encounter for closed fracture: Secondary | ICD-10-CM | POA: Diagnosis not present

## 2013-06-30 DIAGNOSIS — S52599A Other fractures of lower end of unspecified radius, initial encounter for closed fracture: Secondary | ICD-10-CM | POA: Diagnosis not present

## 2013-07-01 DIAGNOSIS — Z8505 Personal history of malignant neoplasm of liver: Secondary | ICD-10-CM | POA: Diagnosis not present

## 2013-07-01 DIAGNOSIS — Z91038 Other insect allergy status: Secondary | ICD-10-CM | POA: Diagnosis not present

## 2013-07-01 DIAGNOSIS — N400 Enlarged prostate without lower urinary tract symptoms: Secondary | ICD-10-CM | POA: Diagnosis not present

## 2013-07-01 DIAGNOSIS — E782 Mixed hyperlipidemia: Secondary | ICD-10-CM | POA: Diagnosis not present

## 2013-07-01 DIAGNOSIS — J069 Acute upper respiratory infection, unspecified: Secondary | ICD-10-CM | POA: Diagnosis not present

## 2013-07-01 DIAGNOSIS — Z1331 Encounter for screening for depression: Secondary | ICD-10-CM | POA: Diagnosis not present

## 2013-07-01 DIAGNOSIS — H9190 Unspecified hearing loss, unspecified ear: Secondary | ICD-10-CM | POA: Diagnosis not present

## 2013-07-12 DIAGNOSIS — S52599A Other fractures of lower end of unspecified radius, initial encounter for closed fracture: Secondary | ICD-10-CM | POA: Diagnosis not present

## 2013-07-21 DIAGNOSIS — R82998 Other abnormal findings in urine: Secondary | ICD-10-CM | POA: Diagnosis not present

## 2013-07-21 DIAGNOSIS — R3915 Urgency of urination: Secondary | ICD-10-CM | POA: Diagnosis not present

## 2013-07-21 DIAGNOSIS — R351 Nocturia: Secondary | ICD-10-CM | POA: Diagnosis not present

## 2013-07-21 DIAGNOSIS — N400 Enlarged prostate without lower urinary tract symptoms: Secondary | ICD-10-CM | POA: Diagnosis not present

## 2013-07-26 DIAGNOSIS — S52599A Other fractures of lower end of unspecified radius, initial encounter for closed fracture: Secondary | ICD-10-CM | POA: Diagnosis not present

## 2013-07-28 DIAGNOSIS — G5600 Carpal tunnel syndrome, unspecified upper limb: Secondary | ICD-10-CM | POA: Diagnosis not present

## 2013-08-05 DIAGNOSIS — S52599A Other fractures of lower end of unspecified radius, initial encounter for closed fracture: Secondary | ICD-10-CM | POA: Diagnosis not present

## 2013-08-12 DIAGNOSIS — S52599A Other fractures of lower end of unspecified radius, initial encounter for closed fracture: Secondary | ICD-10-CM | POA: Diagnosis not present

## 2013-08-16 DIAGNOSIS — S52599A Other fractures of lower end of unspecified radius, initial encounter for closed fracture: Secondary | ICD-10-CM | POA: Diagnosis not present

## 2013-08-18 DIAGNOSIS — G5600 Carpal tunnel syndrome, unspecified upper limb: Secondary | ICD-10-CM | POA: Diagnosis not present

## 2013-08-25 DIAGNOSIS — G5600 Carpal tunnel syndrome, unspecified upper limb: Secondary | ICD-10-CM | POA: Diagnosis not present

## 2013-09-01 DIAGNOSIS — G5600 Carpal tunnel syndrome, unspecified upper limb: Secondary | ICD-10-CM | POA: Diagnosis not present

## 2013-09-01 DIAGNOSIS — S52599A Other fractures of lower end of unspecified radius, initial encounter for closed fracture: Secondary | ICD-10-CM | POA: Diagnosis not present

## 2013-09-08 DIAGNOSIS — G5600 Carpal tunnel syndrome, unspecified upper limb: Secondary | ICD-10-CM | POA: Diagnosis not present

## 2013-09-08 DIAGNOSIS — S52599A Other fractures of lower end of unspecified radius, initial encounter for closed fracture: Secondary | ICD-10-CM | POA: Diagnosis not present

## 2013-09-08 NOTE — H&P (Signed)
Corey Davila is an 80 y.o. male.   Chief Complaint: c/o chronic and progressive numbness and tingling of the left hand HPI: Corey Davila returns for follow-up examination of his left distal radius impacted fracture.  He is 12 weeks post injury.  He had an acute episode of severe pain in his forearm that sounds almost like a flare reaction or a transient CRPS type response.  He could not even tolerate his splint on the arm.  He has no history of gout.  His wife has gout and is familiar with the presentation of gout.   He states he has severe pain both day and night. It is very challenging for Corey Davila to articulate the nature of his pain. After questioning him about various types of pain, it is apparent to me that he is experiencing numbness. My impression is that he likely had a crescendo carpal tunnel syndrome following his fracture and development of a mild to moderate malunion. He does not show signs of CRPS type I or II at this time.   Past Medical History  Diagnosis Date  . Liver cancer     partial liver resection 06/18/2007; partial liver resection 08/29/2010, not renal  . History of kidney cancer 06/19/1979    L renal taken out   . Mixed hyperlipidemia   . Cough     Past Surgical History  Procedure Laterality Date  . Nephrectomy      left, [place]   . Partial hepatectomy  06/18/07    [facility]  R lobe  . Parttial hepatectomy  08/29/2010    [facility]  L lobe    Family History  Problem Relation Age of Onset  . Stroke Father    Social History:  reports that he has never smoked. He has never used smokeless tobacco. His alcohol and drug histories are not on file.  Allergies:  Allergies  Allergen Reactions  . Tessalon [Benzonatate]   . Tussicaps [Hydrocod Polst-Cpm Polst Er]   . Bee Venom   . Dilantin [Phenytoin]   . Mevacor [Lovastatin]   . Niacin And Related     Rash   . Septra Ds [Sulfamethoxazole-Trimethoprim]     No prescriptions prior to admission     No results found for this or any previous visit (from the past 48 hour(s)).  No results found.   Pertinent items are noted in HPI.  There were no vitals taken for this visit.  General appearance: alert Head: Normocephalic, without obvious abnormality Neck: supple, symmetrical, trachea midline Resp: clear to auscultation bilaterally Cardio: regular rate and rhythm GI: normal findings: bowel sounds normal Extremities:On exam he has a very mild malunion of his radius which is expected with closed treatment. He has palmar flexion 50, dorsiflexion 60, radial deviation 20, ulnar deviation 30. He does not show any signs of acute pseudogout at this time.  Dr. Zebedee Iba completed detailed electrodiagnostic studies. Indeed he documents bilateral carpal tunnel syndrome moderate on the right and very severe on the left. Pulses: 2+ and symmetric Skin: normal Neurologic: Grossly normal    Assessment/Plan Impression:  Left CTS s/p left distal radius fracture 12 weeks prior.  Plan TO the OR for left CTR.The procedure, risks,benefits and post-op course were discussed with the patient at length and they were in agreement with the plan.  DASNOIT,Linsay Vogt J 09/08/2013, 4:21 PM  H&P documentation: 09/10/2013  -History and Physical Reviewed  -Patient has been re-examined  -No change in the plan of care  Corey Davila  Christena Flake, MD

## 2013-09-09 ENCOUNTER — Encounter (HOSPITAL_BASED_OUTPATIENT_CLINIC_OR_DEPARTMENT_OTHER): Payer: Self-pay | Admitting: *Deleted

## 2013-09-09 ENCOUNTER — Other Ambulatory Visit: Payer: Self-pay | Admitting: Orthopedic Surgery

## 2013-09-09 NOTE — Progress Notes (Signed)
Pt is very active for age-fx wrist 11/14-too much pain-wants it fixed before golf season starts. Hx renal and liver cancer, but doing well

## 2013-09-10 ENCOUNTER — Encounter (HOSPITAL_BASED_OUTPATIENT_CLINIC_OR_DEPARTMENT_OTHER): Payer: Self-pay | Admitting: Orthopedic Surgery

## 2013-09-10 ENCOUNTER — Ambulatory Visit (HOSPITAL_BASED_OUTPATIENT_CLINIC_OR_DEPARTMENT_OTHER)
Admission: RE | Admit: 2013-09-10 | Discharge: 2013-09-10 | Disposition: A | Discharge status: Home / Self Care | Payer: Medicare Other | Source: Ambulatory Visit | Attending: Orthopedic Surgery | Admitting: Orthopedic Surgery

## 2013-09-10 ENCOUNTER — Encounter (HOSPITAL_BASED_OUTPATIENT_CLINIC_OR_DEPARTMENT_OTHER)
Admission: RE | Disposition: A | Discharge status: Home / Self Care | Payer: Self-pay | Source: Ambulatory Visit | Attending: Orthopedic Surgery

## 2013-09-10 ENCOUNTER — Ambulatory Visit (HOSPITAL_BASED_OUTPATIENT_CLINIC_OR_DEPARTMENT_OTHER): Payer: Medicare Other | Admitting: Certified Registered"

## 2013-09-10 ENCOUNTER — Encounter (HOSPITAL_BASED_OUTPATIENT_CLINIC_OR_DEPARTMENT_OTHER): Payer: Medicare Other | Admitting: Certified Registered"

## 2013-09-10 DIAGNOSIS — E782 Mixed hyperlipidemia: Secondary | ICD-10-CM | POA: Diagnosis not present

## 2013-09-10 DIAGNOSIS — Z905 Acquired absence of kidney: Secondary | ICD-10-CM | POA: Diagnosis not present

## 2013-09-10 DIAGNOSIS — Z8505 Personal history of malignant neoplasm of liver: Secondary | ICD-10-CM | POA: Diagnosis not present

## 2013-09-10 DIAGNOSIS — G5600 Carpal tunnel syndrome, unspecified upper limb: Secondary | ICD-10-CM | POA: Insufficient documentation

## 2013-09-10 DIAGNOSIS — Z85528 Personal history of other malignant neoplasm of kidney: Secondary | ICD-10-CM | POA: Diagnosis not present

## 2013-09-10 HISTORY — PX: CARPAL TUNNEL RELEASE: SHX101

## 2013-09-10 HISTORY — DX: Unspecified osteoarthritis, unspecified site: M19.90

## 2013-09-10 LAB — POCT HEMOGLOBIN-HEMACUE: HEMOGLOBIN: 12.3 g/dL — AB (ref 13.0–17.0)

## 2013-09-10 SURGERY — CARPAL TUNNEL RELEASE
Anesthesia: Monitor Anesthesia Care | Site: Wrist | Laterality: Left

## 2013-09-10 MED ORDER — LIDOCAINE HCL (CARDIAC) 20 MG/ML IV SOLN
INTRAVENOUS | Status: DC | PRN
  Administered 2013-09-10: 60 mg via INTRAVENOUS

## 2013-09-10 MED ORDER — FENTANYL CITRATE 0.05 MG/ML IJ SOLN: 50.0000 ug | INTRAMUSCULAR | Status: DC | PRN

## 2013-09-10 MED ORDER — FENTANYL CITRATE 0.05 MG/ML IJ SOLN: 25.0000 ug | INTRAMUSCULAR | Status: DC | PRN

## 2013-09-10 MED ORDER — LIDOCAINE HCL 2 % IJ SOLN
INTRAMUSCULAR | Status: DC | PRN
  Administered 2013-09-10: 5 mL

## 2013-09-10 MED ORDER — FENTANYL CITRATE 0.05 MG/ML IJ SOLN
INTRAMUSCULAR | Status: AC
  Filled 2013-09-10: qty 2

## 2013-09-10 MED ORDER — CHLORHEXIDINE GLUCONATE 4 % EX LIQD: 60.0000 mL | Freq: Once | CUTANEOUS | Status: DC

## 2013-09-10 MED ORDER — HYDROMORPHONE HCL 2 MG PO TABS: 2.0000 mg | ORAL_TABLET | ORAL | Status: DC | PRN

## 2013-09-10 MED ORDER — PROPOFOL INFUSION 10 MG/ML OPTIME
INTRAVENOUS | Status: DC | PRN
  Administered 2013-09-10: 75 ug/kg/min via INTRAVENOUS

## 2013-09-10 MED ORDER — MIDAZOLAM HCL 2 MG/2ML IJ SOLN: 1.0000 mg | INTRAMUSCULAR | Status: DC | PRN

## 2013-09-10 MED ORDER — LACTATED RINGERS IV SOLN
INTRAVENOUS | Status: DC
  Administered 2013-09-10: 08:00:00 via INTRAVENOUS

## 2013-09-10 MED ORDER — FENTANYL CITRATE 0.05 MG/ML IJ SOLN
INTRAMUSCULAR | Status: DC | PRN
  Administered 2013-09-10: 25 ug via INTRAVENOUS

## 2013-09-10 MED ORDER — ONDANSETRON HCL 4 MG/2ML IJ SOLN
INTRAMUSCULAR | Status: DC | PRN
  Administered 2013-09-10: 4 mg via INTRAVENOUS

## 2013-09-10 SURGICAL SUPPLY — 39 items
BANDAGE ADH SHEER 1  50/CT (GAUZE/BANDAGES/DRESSINGS) IMPLANT
BANDAGE ELASTIC 3 VELCRO ST LF (GAUZE/BANDAGES/DRESSINGS) ×3 IMPLANT
BLADE SURG 15 STRL LF DISP TIS (BLADE) ×1 IMPLANT
BLADE SURG 15 STRL SS (BLADE) ×3
BNDG CMPR 9X4 STRL LF SNTH (GAUZE/BANDAGES/DRESSINGS)
BNDG COHESIVE 3X5 TAN STRL LF (GAUZE/BANDAGES/DRESSINGS) ×2 IMPLANT
BNDG ESMARK 4X9 LF (GAUZE/BANDAGES/DRESSINGS) IMPLANT
BRUSH SCRUB EZ PLAIN DRY (MISCELLANEOUS) ×3 IMPLANT
CLOSURE WOUND 1/2 X4 (GAUZE/BANDAGES/DRESSINGS) ×1
CORDS BIPOLAR (ELECTRODE) ×2 IMPLANT
COVER MAYO STAND STRL (DRAPES) ×3 IMPLANT
COVER TABLE BACK 60X90 (DRAPES) ×3 IMPLANT
CUFF TOURNIQUET SINGLE 18IN (TOURNIQUET CUFF) ×2 IMPLANT
DECANTER SPIKE VIAL GLASS SM (MISCELLANEOUS) IMPLANT
DRAPE EXTREMITY T 121X128X90 (DRAPE) ×3 IMPLANT
DRAPE SURG 17X23 STRL (DRAPES) ×3 IMPLANT
GLOVE BIOGEL M STRL SZ7.5 (GLOVE) ×1 IMPLANT
GLOVE ORTHO TXT STRL SZ7.5 (GLOVE) ×3 IMPLANT
GOWN STRL REUS W/ TWL LRG LVL3 (GOWN DISPOSABLE) ×1 IMPLANT
GOWN STRL REUS W/ TWL XL LVL3 (GOWN DISPOSABLE) ×2 IMPLANT
GOWN STRL REUS W/TWL LRG LVL3 (GOWN DISPOSABLE) ×3
GOWN STRL REUS W/TWL XL LVL3 (GOWN DISPOSABLE) ×6
NEEDLE 27GAX1X1/2 (NEEDLE) IMPLANT
PACK BASIN DAY SURGERY FS (CUSTOM PROCEDURE TRAY) ×3 IMPLANT
PAD CAST 3X4 CTTN HI CHSV (CAST SUPPLIES) ×1 IMPLANT
PADDING CAST ABS 4INX4YD NS (CAST SUPPLIES) ×2
PADDING CAST ABS COTTON 4X4 ST (CAST SUPPLIES) ×1 IMPLANT
PADDING CAST COTTON 3X4 STRL (CAST SUPPLIES) ×3
SPLINT PLASTER CAST XFAST 3X15 (CAST SUPPLIES) ×5 IMPLANT
SPLINT PLASTER XTRA FASTSET 3X (CAST SUPPLIES) ×10
SPONGE GAUZE 4X4 12PLY (GAUZE/BANDAGES/DRESSINGS) ×3 IMPLANT
STOCKINETTE 4X48 STRL (DRAPES) ×3 IMPLANT
STRIP CLOSURE SKIN 1/2X4 (GAUZE/BANDAGES/DRESSINGS) ×2 IMPLANT
SUT PROLENE 3 0 PS 2 (SUTURE) ×3 IMPLANT
SYR 3ML 23GX1 SAFETY (SYRINGE) IMPLANT
SYR CONTROL 10ML LL (SYRINGE) IMPLANT
TOWEL OR 17X24 6PK STRL BLUE (TOWEL DISPOSABLE) ×3 IMPLANT
TRAY DSU PREP LF (CUSTOM PROCEDURE TRAY) ×3 IMPLANT
UNDERPAD 30X30 INCONTINENT (UNDERPADS AND DIAPERS) ×3 IMPLANT

## 2013-09-10 NOTE — Anesthesia Postprocedure Evaluation (Signed)
  Anesthesia Post-op Note  Patient: Corey Davila  Procedure(s) Performed: Procedure(s): LEFT CARPAL TUNNEL RELEASE (Left)  Patient Location: PACU  Anesthesia Type: MAC  Level of Consciousness: awake and alert   Airway and Oxygen Therapy: Patient Spontanous Breathing  Post-op Pain: none  Post-op Assessment: Post-op Vital signs reviewed, Patient's Cardiovascular Status Stable and Respiratory Function Stable  Post-op Vital Signs: Reviewed  Filed Vitals:   09/10/13 1015  BP: 129/58  Pulse: 69  Temp:   Resp: 12    Complications: No apparent anesthesia complications

## 2013-09-10 NOTE — Brief Op Note (Signed)
09/10/2013  9:33 AM  PATIENT:  Corey Davila  80 y.o. male  PRE-OPERATIVE DIAGNOSIS:  LEFT CARPAL SYNDROME  POST-OPERATIVE DIAGNOSIS:  Left Carpal tunnel Syndrome  PROCEDURE:  Procedure(s): LEFT CARPAL TUNNEL RELEASE (Left)  SURGEON:  Surgeon(s) and Role:    * Cammie Sickle., MD - Primary  PHYSICIAN ASSISTANT:   ASSISTANTS: surgical tech   ANESTHESIA:   MAC  EBL:  Total I/O In: 500 [I.V.:500] Out: -   BLOOD ADMINISTERED:none  DRAINS: none   LOCAL MEDICATIONS USED:  XYLOCAINE   SPECIMEN:  No Specimen  DISPOSITION OF SPECIMEN:  N/A  COUNTS:  YES  TOURNIQUET:   Total Tourniquet Time Documented: Upper Arm (Left) - 19 minutes Total: Upper Arm (Left) - 19 minutes   DICTATION: .Other Dictation: Dictation Number [id number]  PLAN OF CARE: discharge to home from PACU  PATIENT DISPOSITION:  PACU - hemodynamically stable.   Delay start of Pharmacological VTE agent (>24hrs) due to surgical blood loss or risk of bleeding: not applicable

## 2013-09-10 NOTE — Anesthesia Procedure Notes (Signed)
Procedure Name: MAC Date/Time: 09/10/2013 8:40 AM Performed by: Juanya Villavicencio Pre-anesthesia Checklist: Patient identified, Emergency Drugs available, Suction available, Patient being monitored and Timeout performed Patient Re-evaluated:Patient Re-evaluated prior to inductionOxygen Delivery Method: Simple face mask

## 2013-09-10 NOTE — Discharge Instructions (Signed)

## 2013-09-10 NOTE — Anesthesia Preprocedure Evaluation (Addendum)
Anesthesia Evaluation  Patient identified by MRN, date of birth, ID band Patient awake    Reviewed: Allergy & Precautions, H&P , NPO status , Patient's Chart, lab work & pertinent test results  Airway Mallampati: II TM Distance: >3 FB Neck ROM: Full    Dental no notable dental hx. (+) Teeth Intact, Dental Advisory Given   Pulmonary neg pulmonary ROS, resolved,  breath sounds clear to auscultation  Pulmonary exam normal       Cardiovascular negative cardio ROS  Rhythm:Regular Rate:Normal     Neuro/Psych negative neurological ROS  negative psych ROS   GI/Hepatic negative GI ROS, Neg liver ROS,   Endo/Other  negative endocrine ROS  Renal/GU Renal diseasenegative Renal ROSH/o renal CA  negative genitourinary   Musculoskeletal   Abdominal   Peds  Hematology negative hematology ROS (+)   Anesthesia Other Findings   Reproductive/Obstetrics negative OB ROS                          Anesthesia Physical Anesthesia Plan  ASA: II  Anesthesia Plan: MAC   Post-op Pain Management:    Induction: Intravenous  Airway Management Planned: Simple Face Mask  Additional Equipment:   Intra-op Plan:   Post-operative Plan:   Informed Consent: I have reviewed the patients History and Physical, chart, labs and discussed the procedure including the risks, benefits and alternatives for the proposed anesthesia with the patient or authorized representative who has indicated his/her understanding and acceptance.   Dental advisory given  Plan Discussed with: CRNA  Anesthesia Plan Comments:         Anesthesia Quick Evaluation

## 2013-09-10 NOTE — Op Note (Signed)
[id number]  

## 2013-09-10 NOTE — Transfer of Care (Signed)
Immediate Anesthesia Transfer of Care Note  Patient: Corey Davila  Procedure(s) Performed: Procedure(s): LEFT CARPAL TUNNEL RELEASE (Left)  Patient Location: PACU  Anesthesia Type:MAC  Level of Consciousness: awake, alert , oriented and patient cooperative  Airway & Oxygen Therapy: Patient Spontanous Breathing and Patient connected to face mask oxygen  Post-op Assessment: Report given to PACU RN and Post -op Vital signs reviewed and stable  Post vital signs: Reviewed and stable  Complications: No apparent anesthesia complications

## 2013-09-13 ENCOUNTER — Encounter (HOSPITAL_BASED_OUTPATIENT_CLINIC_OR_DEPARTMENT_OTHER): Payer: Self-pay | Admitting: Orthopedic Surgery

## 2013-09-13 NOTE — Op Note (Signed)
Corey Davila, Corey Davila              ACCOUNT NO.:  [id number]  MEDICAL RECORD NO.:  [id number]  LOCATION:                               FACILITY:  [facility]  PHYSICIAN:  Youlanda Mighty. Harol Shabazz, M.D. DATE OF BIRTH:  11-10-1923  DATE OF PROCEDURE:  09/10/2013 DATE OF DISCHARGE:  09/10/2013                              OPERATIVE REPORT   PREOPERATIVE DIAGNOSIS:  Severe left carpal tunnel syndrome, status post closed fracture of left distal radius twelve weeks prior with subclinical right carpal tunnel syndrome noted by preoperative electrodiagnostic studies.  POSTOPERATIVE DIAGNOSIS:  Severe left carpal tunnel syndrome, status post closed fracture of left distal radius twelve weeks prior with subclinical right carpal tunnel syndrome noted by preoperative electrodiagnostic studies.  OPERATIONS:  Release of left transverse carpal ligament.  OPERATING SURGEON:  Theodis Sato, M.D.  ASSISTANT:  Surgical technician.  ANESTHESIA:  A 2% lidocaine field block and median nerve block, left hand and wrist supplemented by IV sedation.  SUPERVISING ANESTHESIOLOGIST:  Dr. Ola Spurr.  INDICATIONS:  Corey Davila is 80 year old retired gentleman, who presented for evaluation and management of a left distal radius fracture 12 weeks prior.  Due to his age and other medical problems.  We treated him with closed technique.  He developed mild malunion of the radius with slight loss of length, radial slope and, and volar tilt.  He went on to solidly heal his fracture.  During the past 2 weeks, he has had acute episodes of pain that he could not characterize well.  After detailed questioning this week, we formed the opinion that he likely had posttraumatic carpal tunnel syndrome as he did notice hand was numb at night.  He had dense numbness in the thumb, index, long, and ring fingers, and preservation of sensibility of the small finger.  Detailed electrodiagnostic studies were obtained at our office,  which documented the presence of very significant left carpal tunnel syndrome.  After informed consent, he is brought to the operating room at this time.  Preoperatively, his past medical history has been detailed.  He was interviewed by Dr. Ola Spurr of Anesthesia, who recommended local anesthesia with sedation.  PROCEDURE:  Corey Davila was interviewed in the holding area and his proper surgical site identified per protocol with a marking pen.  He was subsequently transferred to room 1 of the [facility], where under Dr. Blane Ohara supervision, IV sedation was provided.  The left hand and arm were prepped with Betadine soap solution, sterilely draped.  2% lidocaine was infiltrated in the path of the intended incision and around the median nerve in the distal forearm, total volume of 4.5 mL.  After approximately 5 minutes, excellent anesthesia was achieved.  The left hand and arm were then exsanguinated with an Esmarch bandage and arterial tourniquet on the proximal brachium was inflated to 220 mmHg.  The procedure commenced with conventional carpal tunnel incision paralleling the thenar crease.  Subcutaneous tissues were carefully divided taking care to identify the palmar fascia, which was split in the line of its fibers.  The mid palmar space was dissected and found to have a Very thickened fascia.  The distal aspect of the transverse carpal ligament was identified  and noted to be somewhat complex with multiple layers.  We looked for abberant motor branches and did not identify any.  The transverse carpal ligament was then sequentially released with scissors along its ulnar border extending into the distal forearm. There was very severe tightness at the proximal margin of transverse carpal ligament due to the malunion of the radius.  The contents of the carpal canal were inspected.  There was moderate tenosynovium noted.  The median nerve was noted to be  edematous and hyperemic.  Adhesions of tenosynovium were gently released by spreading with scissors.  There were no mass or other predicaments noted.  Bleeding points along the margin of the released ligament were electrocauterized with bipolar current, followed by repair of the skin with intradermal 3-0 Prolene.  A compressive dressing was applied with a volar plaster splint maintaining the wrist and 15 degrees of dorsiflexion.  For aftercare, Corey Davila was provided prescription for Dilaudid 2 mg 1 p.o. q.4-6 hours p.r.n. pain.  He has tramadol at home and will be advised to use the tramadol as needed.     Youlanda Mighty Anajah Sterbenz, M.D.     RVS/MEDQ  D:  09/10/2013  T:  09/10/2013  Job:  [id number]

## 2013-09-21 DIAGNOSIS — Z8669 Personal history of other diseases of the nervous system and sense organs: Secondary | ICD-10-CM | POA: Diagnosis not present

## 2013-09-21 DIAGNOSIS — R42 Dizziness and giddiness: Secondary | ICD-10-CM | POA: Diagnosis not present

## 2013-10-13 DIAGNOSIS — G5600 Carpal tunnel syndrome, unspecified upper limb: Secondary | ICD-10-CM | POA: Diagnosis not present

## 2013-10-19 DIAGNOSIS — G5600 Carpal tunnel syndrome, unspecified upper limb: Secondary | ICD-10-CM | POA: Diagnosis not present

## 2013-10-29 DIAGNOSIS — G5600 Carpal tunnel syndrome, unspecified upper limb: Secondary | ICD-10-CM | POA: Diagnosis not present

## 2013-11-05 DIAGNOSIS — G5600 Carpal tunnel syndrome, unspecified upper limb: Secondary | ICD-10-CM | POA: Diagnosis not present

## 2013-11-12 DIAGNOSIS — G5600 Carpal tunnel syndrome, unspecified upper limb: Secondary | ICD-10-CM | POA: Diagnosis not present

## 2013-11-16 DIAGNOSIS — Z9889 Other specified postprocedural states: Secondary | ICD-10-CM | POA: Diagnosis not present

## 2013-11-16 DIAGNOSIS — J984 Other disorders of lung: Secondary | ICD-10-CM | POA: Diagnosis not present

## 2013-11-16 DIAGNOSIS — R918 Other nonspecific abnormal finding of lung field: Secondary | ICD-10-CM | POA: Diagnosis not present

## 2013-11-16 DIAGNOSIS — Z483 Aftercare following surgery for neoplasm: Secondary | ICD-10-CM | POA: Diagnosis not present

## 2013-11-16 DIAGNOSIS — N289 Disorder of kidney and ureter, unspecified: Secondary | ICD-10-CM | POA: Diagnosis not present

## 2013-11-16 DIAGNOSIS — Z9089 Acquired absence of other organs: Secondary | ICD-10-CM | POA: Diagnosis not present

## 2013-11-16 DIAGNOSIS — R42 Dizziness and giddiness: Secondary | ICD-10-CM | POA: Diagnosis not present

## 2013-11-16 DIAGNOSIS — IMO0002 Reserved for concepts with insufficient information to code with codable children: Secondary | ICD-10-CM | POA: Diagnosis not present

## 2013-11-16 DIAGNOSIS — C228 Malignant neoplasm of liver, primary, unspecified as to type: Secondary | ICD-10-CM | POA: Diagnosis not present

## 2013-11-16 DIAGNOSIS — M439 Deforming dorsopathy, unspecified: Secondary | ICD-10-CM | POA: Diagnosis not present

## 2013-11-16 DIAGNOSIS — Z8505 Personal history of malignant neoplasm of liver: Secondary | ICD-10-CM | POA: Diagnosis not present

## 2013-11-16 DIAGNOSIS — K7689 Other specified diseases of liver: Secondary | ICD-10-CM | POA: Diagnosis not present

## 2013-11-16 DIAGNOSIS — I708 Atherosclerosis of other arteries: Secondary | ICD-10-CM | POA: Diagnosis not present

## 2013-11-16 DIAGNOSIS — Z905 Acquired absence of kidney: Secondary | ICD-10-CM | POA: Diagnosis not present

## 2013-11-18 DIAGNOSIS — H905 Unspecified sensorineural hearing loss: Secondary | ICD-10-CM | POA: Diagnosis not present

## 2013-11-18 DIAGNOSIS — R42 Dizziness and giddiness: Secondary | ICD-10-CM | POA: Diagnosis not present

## 2013-11-18 DIAGNOSIS — H6120 Impacted cerumen, unspecified ear: Secondary | ICD-10-CM | POA: Diagnosis not present

## 2013-11-18 DIAGNOSIS — H903 Sensorineural hearing loss, bilateral: Secondary | ICD-10-CM | POA: Diagnosis not present

## 2013-11-24 DIAGNOSIS — H905 Unspecified sensorineural hearing loss: Secondary | ICD-10-CM | POA: Diagnosis not present

## 2013-11-24 DIAGNOSIS — R42 Dizziness and giddiness: Secondary | ICD-10-CM | POA: Diagnosis not present

## 2013-11-26 DIAGNOSIS — I69998 Other sequelae following unspecified cerebrovascular disease: Secondary | ICD-10-CM | POA: Diagnosis not present

## 2013-11-26 DIAGNOSIS — R42 Dizziness and giddiness: Secondary | ICD-10-CM | POA: Diagnosis not present

## 2013-12-07 DIAGNOSIS — Z8505 Personal history of malignant neoplasm of liver: Secondary | ICD-10-CM | POA: Diagnosis not present

## 2013-12-07 DIAGNOSIS — R42 Dizziness and giddiness: Secondary | ICD-10-CM | POA: Diagnosis not present

## 2013-12-07 DIAGNOSIS — N400 Enlarged prostate without lower urinary tract symptoms: Secondary | ICD-10-CM | POA: Diagnosis not present

## 2013-12-07 DIAGNOSIS — E782 Mixed hyperlipidemia: Secondary | ICD-10-CM | POA: Diagnosis not present

## 2013-12-22 DIAGNOSIS — H2510 Age-related nuclear cataract, unspecified eye: Secondary | ICD-10-CM | POA: Diagnosis not present

## 2013-12-22 DIAGNOSIS — H02409 Unspecified ptosis of unspecified eyelid: Secondary | ICD-10-CM | POA: Diagnosis not present

## 2013-12-29 ENCOUNTER — Encounter: Payer: Self-pay | Admitting: *Deleted

## 2013-12-29 DIAGNOSIS — G5600 Carpal tunnel syndrome, unspecified upper limb: Secondary | ICD-10-CM | POA: Diagnosis not present

## 2013-12-29 DIAGNOSIS — S52599A Other fractures of lower end of unspecified radius, initial encounter for closed fracture: Secondary | ICD-10-CM | POA: Diagnosis not present

## 2013-12-30 ENCOUNTER — Ambulatory Visit (INDEPENDENT_AMBULATORY_CARE_PROVIDER_SITE_OTHER): Payer: Medicare Other | Admitting: Neurology

## 2013-12-30 ENCOUNTER — Telehealth: Payer: Self-pay | Admitting: *Deleted

## 2013-12-30 ENCOUNTER — Encounter: Payer: Self-pay | Admitting: Neurology

## 2013-12-30 VITALS — BP 90/58 | HR 68 | Temp 98.0°F | Resp 16 | Ht 67.2 in | Wt 150.0 lb

## 2013-12-30 DIAGNOSIS — H8110 Benign paroxysmal vertigo, unspecified ear: Secondary | ICD-10-CM | POA: Diagnosis not present

## 2013-12-30 DIAGNOSIS — R6889 Other general symptoms and signs: Secondary | ICD-10-CM | POA: Diagnosis not present

## 2013-12-30 DIAGNOSIS — I998 Other disorder of circulatory system: Secondary | ICD-10-CM

## 2013-12-30 DIAGNOSIS — R42 Dizziness and giddiness: Secondary | ICD-10-CM

## 2013-12-30 NOTE — Progress Notes (Signed)
NEUROLOGY CONSULTATION NOTE  Corey Davila MRN: [id number] DOB: 1924/03/30  Referring provider: Dr. Moreen Fowler Primary care provider: Dr. Moreen Fowler  Reason for consult:  Dizziness.  HISTORY OF PRESENT ILLNESS: Corey Davila is an 80 year old right-handed man with history of liver cancer status post partial resection (75%) in 2008 and 2012, hyperlipidemia, BPH, neck pain and hearing loss who presents for dizziness. He is accompanied by his wife and daughter. PCP note reviewed.  Symptoms started at the end of February. Whenever he would get out of bed in the morning or stand up from a chair, he would experience dizziness. He describes this as a sense of wooziness as well as spinning sensation. He doesn't feel like he is going to pass out. There is no associated nausea, focal numbness or weakness, or slurred speech. It lasts a minute or two.  He has recently been diagnosed with hearing loss and is now using hearing aids. He denies any tinnitus.Marland Kitchen  He was recently reporting blood pressures from 110s-120s/60s-70s.  He followed up with his PCP, Dr. Moreen Fowler, who did not find him to be orthostatic.  Nonetheless, he was advised to hold his desmopressin and finasteride and to increase hydration.  He has prior history of dizziness.  He saw Dr. Ernesto Rutherford of ENT in 2012.  At that time, Dr. Ernesto Rutherford recommended an MRI of the brain, but the patient deferred.  He went to vestibular rehab for 2 or 3 months and it resolved after that.  He saw Dr. Ernesto Rutherford again in March 2015.  He thought that it was related to low blood pressure and that vestibular rehab wouldn't help.  An MRI of the brain was reportedly performed and was unremarkable.  He has been taking meclizine every morning and has not noted much improvement.   Reviewing past vitals, he has exhibited some fluctuation in blood pressure. From April to June 3267, his systolic blood pressure fluctuated from 104 to 140s. His vital signs today is 12/45 but his systolic  blood pressure in February was 140.  PAST MEDICAL HISTORY: Past Medical History  Diagnosis Date  . Liver cancer     partial liver resection 06/18/2007; partial liver resection 08/29/2010, not renal  . History of kidney cancer 06/19/1979    L renal taken out   . Mixed hyperlipidemia   . Cough   . Arthritis   . Wears hearing aid     both ears    PAST SURGICAL HISTORY: Past Surgical History  Procedure Laterality Date  . Nephrectomy  1980    left, [place] -cancer  . Partial hepatectomy  06/18/07    [facility]  R lobe-cancer  . Parttial hepatectomy  08/29/2010    [facility]  L lobe  . Colonoscopy    . Carpal tunnel release Left 09/10/2013    Procedure: LEFT CARPAL TUNNEL RELEASE;  Surgeon: Cammie Sickle., MD;  Location: [facility];  Service: Orthopedics;  Laterality: Left;    MEDICATIONS: Current Outpatient Prescriptions on File Prior to Visit  Medication Sig Dispense Refill  . docusate sodium (COLACE) 100 MG capsule Take 100 mg by mouth daily.      . Milk Thistle 250 MG CAPS Take 1 capsule by mouth daily.      . Misc Natural Products (OSTEO BI-FLEX JOINT SHIELD PO) Take 1 tablet by mouth 2 (two) times daily.      . Multiple Minerals-Vitamins (CALCIUM CITRATE PLUS/MAGNESIUM PO) Take 1 tablet by mouth daily.      Marland Kitchen  Multiple Vitamin (MULTIVITAMIN) tablet Take 1 tablet by mouth daily.      . CRESTOR 20 MG tablet Take 1 tablet by mouth every other day.      . desmopressin (DDAVP) 0.2 MG tablet Take 0.5 tablets by mouth at bedtime.      . finasteride (PROSCAR) 5 MG tablet Take 1 tablet by mouth daily.      Marland Kitchen HYDROmorphone (DILAUDID) 2 MG tablet Take 1 tablet (2 mg total) by mouth every 4 (four) hours as needed.  20 tablet  0   No current facility-administered medications on file prior to visit.    ALLERGIES: Allergies  Allergen Reactions  . Tessalon [Benzonatate]   . Tussicaps [Hydrocod Polst-Cpm Polst Er]   . Bee Venom   . Dilantin [Phenytoin]   . Mevacor  [Lovastatin]   . Niacin And Related     Rash   . Septra Ds [Sulfamethoxazole-Trimethoprim]     FAMILY HISTORY: Family History  Problem Relation Age of Onset  . Stroke Father     SOCIAL HISTORY: History   Social History  . Marital Status: Married    Spouse Name: N/A    Number of Children: N/A  . Years of Education: N/A   Occupational History  . 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 Smoking status: Never Smoker   . Smokeless tobacco: Never Used  . Alcohol Use: No  . Drug Use: No  . Sexual Activity: Not on file   Other Topics Concern  . Not on file   Social History Narrative  . No narrative on file    REVIEW OF SYSTEMS: Constitutional: No fevers, chills, or sweats, no generalized fatigue, change in appetite Eyes: No visual changes, double vision, eye pain Ear, nose and throat: No hearing loss, ear pain, nasal congestion, sore throat Cardiovascular: No chest pain, palpitations Respiratory:  No shortness of breath at rest or with exertion, wheezes GastrointestinaI: No nausea, vomiting, diarrhea, abdominal pain, fecal incontinence Genitourinary:  No dysuria, urinary retention or frequency Musculoskeletal:  No neck pain, back pain Integumentary: No rash, pruritus, skin lesions Neurological: as above Psychiatric: No depression, insomnia, anxiety Endocrine: No palpitations, fatigue, diaphoresis, mood swings, change in appetite, change in weight, increased thirst Hematologic/Lymphatic:  No anemia, purpura, petechiae. Allergic/Immunologic: no itchy/runny eyes, nasal congestion, recent allergic reactions, rashes  PHYSICAL EXAM: Filed Vitals:   12/30/13 0955  BP: 90/58  Pulse: 68  Temp: 98 F (36.7 C)  Resp: 16  Orthostatics:   Left  Right Supine  90/58  100/60 Sitting   104/60  90/58 Standing 120/64  114/64 General: No acute distress Head:  Normocephalic/atraumatic Neck: supple, no paraspinal tenderness, full range of  motion Back: No paraspinal tenderness Heart: regular rate and rhythm Lungs: Clear to auscultation bilaterally. Vascular: No carotid bruits. Neurological Exam: Mental status: alert and oriented to person, place, and time, recent and remote memory intact, fund of knowledge intact, attention and concentration intact, speech fluent and not dysarthric, language intact. Cranial nerves: CN I: not tested CN II: pupils equal, round and reactive to light, visual fields intact, fundi unremarkable, without vessel changes, exudates, hemorrhages or papilledema. CN III, IV, VI:  full range of motion, no nystagmus, no ptosis CN V: facial sensation intact CN VII: upper and lower face symmetric CN VIII: hearing intact CN IX, X: gag intact, uvula midline CN XI: sternocleidomastoid and trapezius muscles intact CN XII: tongue midline Bulk & Tone: normal, no  fasciculations. Motor: 5 out of 5 throughout. Sensation: Pinprick and vibration intact. Deep Tendon Reflexes: 2+ throughout, toes downgoing. Finger to nose testing: No dysmetria. Heel to shin: No dysmetria. Gait: Normal station and stride. Able to turn and walk in tandem. Romberg negative.  IMPRESSION: Vertigo.  It most likely is benign positional vertigo.  Although not clearly orthostatic, he does have significant fluctuations in blood pressure (curiously, his systolic blood pressure increased by 30 from supine to standing).  How much this is playing a role, I cannot tell.  Fluctuating blood pressure  PLAN: 1.  I would refer to vestibular rehab 2.  I would limit use of meclizine (since it is not effective, I would probably stop it.  Definitely should not use it daily as a preventative). 3.  There really isn't much else I can offer from this standpoint.  45 minutes spent with patient, over 50% spent counseling and coordinating care.  Thank you for allowing me to take part in the care of this patient.  Metta Clines, DO  CC:  Antony Contras,  MD

## 2013-12-30 NOTE — Telephone Encounter (Signed)
I  Contacted Delrae Alfred and left a message for him to contact us back regarding this patient  at [facility] [phone]  This is the therapist that patient is requesting for vestibular therapy

## 2013-12-30 NOTE — Patient Instructions (Signed)
I think the dizziness is related to low or fluctuation of blood pressure.  I would discuss further management with Dr. Moreen Fowler.

## 2013-12-30 NOTE — Telephone Encounter (Signed)
Patient daughter is aware that patient should stop Meclizine per Dr Tomi Likens

## 2013-12-31 DIAGNOSIS — E782 Mixed hyperlipidemia: Secondary | ICD-10-CM | POA: Diagnosis not present

## 2013-12-31 DIAGNOSIS — N400 Enlarged prostate without lower urinary tract symptoms: Secondary | ICD-10-CM | POA: Diagnosis not present

## 2013-12-31 DIAGNOSIS — Z8505 Personal history of malignant neoplasm of liver: Secondary | ICD-10-CM | POA: Diagnosis not present

## 2013-12-31 DIAGNOSIS — I951 Orthostatic hypotension: Secondary | ICD-10-CM | POA: Diagnosis not present

## 2014-01-03 ENCOUNTER — Telehealth: Payer: Self-pay | Admitting: Neurology

## 2014-01-03 NOTE — Telephone Encounter (Signed)
Pt calling for an update on PT approval. / Sherri S.

## 2014-01-03 NOTE — Telephone Encounter (Signed)
I spoke with patient he is aware that PT will be contacting him . If he has not heard anything by the end of this week I advised him to contact the office and I would follow up

## 2014-01-10 ENCOUNTER — Encounter: Payer: Self-pay | Admitting: Cardiology

## 2014-01-10 ENCOUNTER — Ambulatory Visit (INDEPENDENT_AMBULATORY_CARE_PROVIDER_SITE_OTHER): Payer: Medicare Other | Admitting: Cardiology

## 2014-01-10 VITALS — BP 118/70 | HR 61 | Ht 68.0 in | Wt 146.0 lb

## 2014-01-10 DIAGNOSIS — I951 Orthostatic hypotension: Secondary | ICD-10-CM | POA: Diagnosis not present

## 2014-01-10 DIAGNOSIS — R55 Syncope and collapse: Secondary | ICD-10-CM

## 2014-01-10 NOTE — Patient Instructions (Addendum)
The current medical regimen is effective;  continue present plan and medications.  Your physician has requested that you have an echocardiogram. Echocardiography is a painless test that uses sound waves to create images of your heart. It provides your doctor with information about the size and shape of your heart and how well your heart's chambers and valves are working. This procedure takes approximately one hour. There are no restrictions for this procedure.  Please increase your fluid and sodium intake.  Take your time changing positions.  Dangle your legs after sitting up before standing up.  Follow up as needed.

## 2014-01-10 NOTE — Progress Notes (Signed)
[place]. [address], [address], [place]  [address] Phone: [phone] Fax:  [phone]  Date:  01/10/2014   ID:  JACARI IANNELLO, DOB 31-Oct-1923, MRN [id number]  PCP:  Gara Kroner, MD   History of Present Illness: Corey Davila is a 80 y.o. male here for the evaluation of dizziness and hypotension. His wife is a patient of mine. He has seen his primary physician for this as well as neurology.  Blood pressure has been as low as 90 systolic at times. A few years ago, he suffered with vestibular dysfunction/vertigo which was different from this experience. He has been recently having difficulties especially in the early morning hours and late in the night when going to the bathroom where if he gets out of bed too quickly he becomes lightheaded, likely hypotensive. He has had a syncopal episode where he gradually fell. He is trying to exercise more. He injured his wrist. He has seen ear nose and throat who recommended liberalizing salt intake. I have reviewed Dr. Moreen Fowler note as well and I agree with his assessment that this is likely from orthostatic hypotension.  He denies any chest pain, shortness of breath, fevers, unexplained weight loss. No orthopnea, no PND.   Wt Readings from Last 3 Encounters:  01/10/14 146 lb (66.225 kg)  12/30/13 150 lb (68.04 kg)  09/10/13 153 lb (69.4 kg)     Past Medical History  Diagnosis Date  . Liver cancer     partial liver resection 06/18/2007; partial liver resection 08/29/2010, not renal  . History of kidney cancer 06/19/1979    L renal taken out   . Mixed hyperlipidemia   . Cough   . Arthritis   . Wears hearing aid     both ears    Past Surgical History  Procedure Laterality Date  . Nephrectomy  1980    left, [place] -cancer  . Partial hepatectomy  06/18/07    [facility]  R lobe-cancer  . Parttial hepatectomy  08/29/2010    [facility]  L lobe  . Colonoscopy    . Carpal tunnel release Left 09/10/2013    Procedure: LEFT  CARPAL TUNNEL RELEASE;  Surgeon: Cammie Sickle., MD;  Location: [place];  Service: Orthopedics;  Laterality: Left;    Current Outpatient Prescriptions  Medication Sig Dispense Refill  . aspirin 81 MG tablet Take 81 mg by mouth daily.      . CRESTOR 20 MG tablet Take 1 tablet by mouth every other day.      . desmopressin (DDAVP) 0.2 MG tablet Take 0.5 tablets by mouth at bedtime.      . Milk Thistle 250 MG CAPS Take 1 capsule by mouth daily.      . Misc Natural Products (OSTEO BI-FLEX JOINT SHIELD PO) Take 1 tablet by mouth 2 (two) times daily.      . Multiple Minerals-Vitamins (CALCIUM CITRATE PLUS/MAGNESIUM PO) Take 1 tablet by mouth daily.      . Multiple Vitamin (MULTIVITAMIN) tablet Take 1 tablet by mouth daily.      . vitamin B-12 (CYANOCOBALAMIN) 250 MCG tablet Take 250 mcg by mouth daily.      . finasteride (PROSCAR) 5 MG tablet Take 1 tablet by mouth daily.       No current facility-administered medications for this visit.    Allergies:    Allergies  Allergen Reactions  . Tessalon [Benzonatate]   . Tussicaps [Hydrocod Polst-Cpm  Polst Er]   . Bee Venom   . Dilantin [Phenytoin]   . Mevacor [Lovastatin]   . Niacin And Related     Rash   . Septra Ds [Sulfamethoxazole-Trimethoprim]     Social History:  The patient  reports that he has never smoked. He has never used smokeless tobacco. He reports that he does not drink alcohol or use illicit drugs.   Family History  Problem Relation Age of Onset  . Stroke Father     ROS:  Please see the history of present illness.    All other systems reviewed and negative.   PHYSICAL EXAM: VS:  BP 118/70  Pulse 61  Ht 5\' 8"  (1.727 m)  Wt 146 lb (66.225 kg)  BMI 22.20 kg/m2 Well nourished, well developed, in no acute distress HEENT: normal, [place]/AT, EOMI Neck: no JVD, normal carotid upstroke, no bruit Cardiac:  normal S1, S2; RRR; no murmur Lungs:  clear to auscultation bilaterally, no wheezing, rhonchi or  rales Abd: soft, nontender, no hepatomegaly, no bruits Ext: no edema, 2+ distal pulses Skin: warm and dry GU: deferred Neuro: no focal abnormalities noted, AAO x 3  EKG:  01/10/14-sinus rhythm, 61, no other significant abnormalities  Labs: 12/14-sodium 131, creatinine 0.85. Prior notes reviewed.  ASSESSMENT AND PLAN:  1. Orthostatic hypotension/syncope-liberalize salt intake, fluid, Gatorade, compression hose, exercise. Be very careful when getting out of bed, sitting for a few minutes to equilibrate. If conservative measures do not work, we may try Midodrine. I will check an echocardiogram to ensure proper structure and function and to exclude cardiomyopathy. I do not appreciate aortic valve murmur. I've explained to him that this is not uncommon especially with advanced age. Low blood pressure correlates with this. Mild weight gain help as well. We will see him back on as-needed basis and at that point, consider further medication. He, his wife, daughter all are in agreement with plan.  Signed, Candee Furbish, MD [facility]  01/10/2014 11:34 AM

## 2014-01-12 ENCOUNTER — Ambulatory Visit (HOSPITAL_COMMUNITY): Payer: Medicare Other | Attending: Cardiology | Admitting: Cardiology

## 2014-01-12 ENCOUNTER — Telehealth: Payer: Self-pay | Admitting: Cardiology

## 2014-01-12 DIAGNOSIS — I059 Rheumatic mitral valve disease, unspecified: Secondary | ICD-10-CM | POA: Diagnosis not present

## 2014-01-12 DIAGNOSIS — R55 Syncope and collapse: Secondary | ICD-10-CM | POA: Diagnosis not present

## 2014-01-12 DIAGNOSIS — I379 Nonrheumatic pulmonary valve disorder, unspecified: Secondary | ICD-10-CM | POA: Diagnosis not present

## 2014-01-12 DIAGNOSIS — E785 Hyperlipidemia, unspecified: Secondary | ICD-10-CM | POA: Insufficient documentation

## 2014-01-12 NOTE — Progress Notes (Signed)
Echo performed. 

## 2014-01-12 NOTE — Telephone Encounter (Signed)
Returned call to patient Dr.Skains advised ok to fly.Advised to stay hydrated.

## 2014-01-12 NOTE — Telephone Encounter (Signed)
Message sent to [place] for advice.

## 2014-01-12 NOTE — Telephone Encounter (Signed)
OK to fly. Say hydrated.  Corey Furbish, MD

## 2014-01-12 NOTE — Telephone Encounter (Signed)
New Message:  Pt's daughter wants to know if the pt can safely fly in a plane this weekend. They meant to ask Dr. Marlou Porch earlier this week

## 2014-01-19 ENCOUNTER — Telehealth: Payer: Self-pay | Admitting: Neurology

## 2014-01-19 ENCOUNTER — Other Ambulatory Visit: Payer: Self-pay | Admitting: *Deleted

## 2014-01-19 DIAGNOSIS — R42 Dizziness and giddiness: Secondary | ICD-10-CM

## 2014-01-19 NOTE — Telephone Encounter (Signed)
Pt called regarding his procedure that Tomi Likens wants him to have. C/B [phone]

## 2014-01-21 NOTE — Telephone Encounter (Signed)
daughter called back. Spoke w/ Ashleyat [facility]. Fax# [phone], phone # [phone]. They told daughter they have not gotten paperwork from [place] yet-please re-fax. / Sherri S.

## 2014-01-21 NOTE — Telephone Encounter (Signed)
Pt's daughter, Dyann Ruddle called. Vestibular Rehab has been scheduled for a [place] office. Pt would rather have this done somewhere in [facility]. Please call Dyann Ruddle @ [phone] or cell [phone] / Gayleen [place]rem.

## 2014-01-24 ENCOUNTER — Ambulatory Visit: Payer: Medicare Other | Attending: Neurology | Admitting: Rehabilitative and Restorative Service Providers"

## 2014-01-24 DIAGNOSIS — H8110 Benign paroxysmal vertigo, unspecified ear: Secondary | ICD-10-CM | POA: Diagnosis not present

## 2014-01-24 DIAGNOSIS — IMO0001 Reserved for inherently not codable concepts without codable children: Secondary | ICD-10-CM | POA: Diagnosis not present

## 2014-01-24 DIAGNOSIS — R269 Unspecified abnormalities of gait and mobility: Secondary | ICD-10-CM | POA: Diagnosis not present

## 2014-01-28 ENCOUNTER — Ambulatory Visit: Payer: Medicare Other | Admitting: Rehabilitative and Restorative Service Providers"

## 2014-01-28 DIAGNOSIS — IMO0001 Reserved for inherently not codable concepts without codable children: Secondary | ICD-10-CM | POA: Diagnosis not present

## 2014-01-31 ENCOUNTER — Ambulatory Visit: Payer: Medicare Other | Admitting: Rehabilitative and Restorative Service Providers"

## 2014-01-31 DIAGNOSIS — H11439 Conjunctival hyperemia, unspecified eye: Secondary | ICD-10-CM | POA: Diagnosis not present

## 2014-01-31 DIAGNOSIS — L918 Other hypertrophic disorders of the skin: Secondary | ICD-10-CM | POA: Diagnosis not present

## 2014-01-31 DIAGNOSIS — IMO0001 Reserved for inherently not codable concepts without codable children: Secondary | ICD-10-CM | POA: Diagnosis not present

## 2014-01-31 DIAGNOSIS — L908 Other atrophic disorders of skin: Secondary | ICD-10-CM | POA: Diagnosis not present

## 2014-01-31 DIAGNOSIS — H02839 Dermatochalasis of unspecified eye, unspecified eyelid: Secondary | ICD-10-CM | POA: Diagnosis not present

## 2014-01-31 DIAGNOSIS — H01029 Squamous blepharitis unspecified eye, unspecified eyelid: Secondary | ICD-10-CM | POA: Diagnosis not present

## 2014-02-03 ENCOUNTER — Encounter: Payer: Medicare Other | Admitting: Rehabilitative and Restorative Service Providers"

## 2014-02-08 ENCOUNTER — Encounter: Payer: Medicare Other | Admitting: Rehabilitative and Restorative Service Providers"

## 2014-02-10 ENCOUNTER — Ambulatory Visit: Payer: Medicare Other | Admitting: Rehabilitative and Restorative Service Providers"

## 2014-02-10 DIAGNOSIS — IMO0001 Reserved for inherently not codable concepts without codable children: Secondary | ICD-10-CM | POA: Diagnosis not present

## 2014-02-15 ENCOUNTER — Ambulatory Visit: Payer: Medicare Other | Admitting: Cardiology

## 2014-03-31 DIAGNOSIS — M25559 Pain in unspecified hip: Secondary | ICD-10-CM | POA: Diagnosis not present

## 2014-03-31 DIAGNOSIS — M545 Low back pain, unspecified: Secondary | ICD-10-CM | POA: Diagnosis not present

## 2014-04-05 DIAGNOSIS — M545 Low back pain, unspecified: Secondary | ICD-10-CM | POA: Diagnosis not present

## 2014-04-05 DIAGNOSIS — M6281 Muscle weakness (generalized): Secondary | ICD-10-CM | POA: Diagnosis not present

## 2014-04-12 DIAGNOSIS — M6281 Muscle weakness (generalized): Secondary | ICD-10-CM | POA: Diagnosis not present

## 2014-04-12 DIAGNOSIS — M545 Low back pain, unspecified: Secondary | ICD-10-CM | POA: Diagnosis not present

## 2014-04-14 ENCOUNTER — Other Ambulatory Visit (HOSPITAL_BASED_OUTPATIENT_CLINIC_OR_DEPARTMENT_OTHER): Payer: Self-pay | Admitting: Orthopedic Surgery

## 2014-04-14 DIAGNOSIS — M545 Low back pain: Secondary | ICD-10-CM | POA: Diagnosis not present

## 2014-04-16 ENCOUNTER — Ambulatory Visit (HOSPITAL_BASED_OUTPATIENT_CLINIC_OR_DEPARTMENT_OTHER)
Admission: RE | Admit: 2014-04-16 | Discharge: 2014-04-16 | Disposition: A | Discharge status: Home / Self Care | Payer: Medicare Other | Source: Ambulatory Visit | Attending: Orthopedic Surgery | Admitting: Orthopedic Surgery

## 2014-04-16 ENCOUNTER — Ambulatory Visit (HOSPITAL_BASED_OUTPATIENT_CLINIC_OR_DEPARTMENT_OTHER): Payer: Medicare Other

## 2014-04-16 DIAGNOSIS — M545 Low back pain: Secondary | ICD-10-CM | POA: Insufficient documentation

## 2014-04-16 DIAGNOSIS — M5126 Other intervertebral disc displacement, lumbar region: Secondary | ICD-10-CM | POA: Diagnosis not present

## 2014-04-18 DIAGNOSIS — M545 Low back pain: Secondary | ICD-10-CM | POA: Diagnosis not present

## 2014-04-20 DIAGNOSIS — M545 Low back pain: Secondary | ICD-10-CM | POA: Diagnosis not present

## 2014-04-20 DIAGNOSIS — M6281 Muscle weakness (generalized): Secondary | ICD-10-CM | POA: Diagnosis not present

## 2014-04-26 DIAGNOSIS — M6281 Muscle weakness (generalized): Secondary | ICD-10-CM | POA: Diagnosis not present

## 2014-04-26 DIAGNOSIS — M545 Low back pain: Secondary | ICD-10-CM | POA: Diagnosis not present

## 2014-04-27 DIAGNOSIS — Z23 Encounter for immunization: Secondary | ICD-10-CM | POA: Diagnosis not present

## 2014-05-02 DIAGNOSIS — M6281 Muscle weakness (generalized): Secondary | ICD-10-CM | POA: Diagnosis not present

## 2014-05-02 DIAGNOSIS — M545 Low back pain: Secondary | ICD-10-CM | POA: Diagnosis not present

## 2014-05-10 DIAGNOSIS — M6281 Muscle weakness (generalized): Secondary | ICD-10-CM | POA: Diagnosis not present

## 2014-05-10 DIAGNOSIS — M545 Low back pain: Secondary | ICD-10-CM | POA: Diagnosis not present

## 2014-05-17 DIAGNOSIS — M6281 Muscle weakness (generalized): Secondary | ICD-10-CM | POA: Diagnosis not present

## 2014-05-17 DIAGNOSIS — M545 Low back pain: Secondary | ICD-10-CM | POA: Diagnosis not present

## 2014-05-24 DIAGNOSIS — C228 Malignant neoplasm of liver, primary, unspecified as to type: Secondary | ICD-10-CM | POA: Diagnosis not present

## 2014-05-24 DIAGNOSIS — I700 Atherosclerosis of aorta: Secondary | ICD-10-CM | POA: Diagnosis not present

## 2014-05-24 DIAGNOSIS — K5730 Diverticulosis of large intestine without perforation or abscess without bleeding: Secondary | ICD-10-CM | POA: Diagnosis not present

## 2014-05-24 DIAGNOSIS — Z905 Acquired absence of kidney: Secondary | ICD-10-CM | POA: Diagnosis not present

## 2014-05-24 DIAGNOSIS — R918 Other nonspecific abnormal finding of lung field: Secondary | ICD-10-CM | POA: Diagnosis not present

## 2014-05-24 DIAGNOSIS — E896 Postprocedural adrenocortical (-medullary) hypofunction: Secondary | ICD-10-CM | POA: Diagnosis not present

## 2014-05-24 DIAGNOSIS — I2510 Atherosclerotic heart disease of native coronary artery without angina pectoris: Secondary | ICD-10-CM | POA: Diagnosis not present

## 2014-05-26 DIAGNOSIS — M6281 Muscle weakness (generalized): Secondary | ICD-10-CM | POA: Diagnosis not present

## 2014-05-26 DIAGNOSIS — M545 Low back pain: Secondary | ICD-10-CM | POA: Diagnosis not present

## 2014-06-06 DIAGNOSIS — M6281 Muscle weakness (generalized): Secondary | ICD-10-CM | POA: Diagnosis not present

## 2014-06-06 DIAGNOSIS — M545 Low back pain: Secondary | ICD-10-CM | POA: Diagnosis not present

## 2014-06-20 DIAGNOSIS — Z8505 Personal history of malignant neoplasm of liver: Secondary | ICD-10-CM | POA: Diagnosis not present

## 2014-06-20 DIAGNOSIS — Z91030 Bee allergy status: Secondary | ICD-10-CM | POA: Diagnosis not present

## 2014-06-20 DIAGNOSIS — N400 Enlarged prostate without lower urinary tract symptoms: Secondary | ICD-10-CM | POA: Diagnosis not present

## 2014-06-20 DIAGNOSIS — E782 Mixed hyperlipidemia: Secondary | ICD-10-CM | POA: Diagnosis not present

## 2014-06-20 DIAGNOSIS — R7309 Other abnormal glucose: Secondary | ICD-10-CM | POA: Diagnosis not present

## 2014-06-20 DIAGNOSIS — Z1389 Encounter for screening for other disorder: Secondary | ICD-10-CM | POA: Diagnosis not present

## 2014-06-27 DIAGNOSIS — M545 Low back pain: Secondary | ICD-10-CM | POA: Diagnosis not present

## 2014-06-27 DIAGNOSIS — M6281 Muscle weakness (generalized): Secondary | ICD-10-CM | POA: Diagnosis not present

## 2014-07-25 DIAGNOSIS — M6281 Muscle weakness (generalized): Secondary | ICD-10-CM | POA: Diagnosis not present

## 2014-07-25 DIAGNOSIS — M545 Low back pain: Secondary | ICD-10-CM | POA: Diagnosis not present

## 2014-08-03 ENCOUNTER — Ambulatory Visit: Payer: Medicare Other | Attending: Physical Medicine and Rehabilitation

## 2014-08-03 DIAGNOSIS — M25551 Pain in right hip: Secondary | ICD-10-CM | POA: Diagnosis not present

## 2014-08-10 ENCOUNTER — Ambulatory Visit: Payer: Medicare Other

## 2014-08-16 ENCOUNTER — Ambulatory Visit: Payer: Medicare Other | Attending: Physical Medicine and Rehabilitation

## 2014-08-16 DIAGNOSIS — M25551 Pain in right hip: Secondary | ICD-10-CM | POA: Diagnosis not present

## 2014-08-18 ENCOUNTER — Ambulatory Visit: Payer: Medicare Other | Admitting: Rehabilitation

## 2014-08-23 ENCOUNTER — Ambulatory Visit: Payer: Medicare Other

## 2014-08-23 DIAGNOSIS — M25551 Pain in right hip: Secondary | ICD-10-CM | POA: Diagnosis not present

## 2014-08-25 ENCOUNTER — Ambulatory Visit: Payer: Medicare Other

## 2014-08-25 DIAGNOSIS — M25551 Pain in right hip: Secondary | ICD-10-CM | POA: Diagnosis not present

## 2014-08-29 ENCOUNTER — Ambulatory Visit: Payer: Medicare Other

## 2014-08-31 ENCOUNTER — Ambulatory Visit: Payer: Medicare Other | Admitting: Rehabilitation

## 2014-08-31 ENCOUNTER — Encounter: Payer: Self-pay | Admitting: Rehabilitation

## 2014-08-31 DIAGNOSIS — M25551 Pain in right hip: Secondary | ICD-10-CM | POA: Diagnosis not present

## 2014-08-31 NOTE — Patient Instructions (Signed)
Encouraged patient to continue exercises and gym activities. Patient felt discouraged with more pain noted today but let him know that his subjective reports indicate that he is improving with standing and walking activities.

## 2014-08-31 NOTE — Therapy (Signed)
[facility] [address]  [place], [place], [address] Phone: [phone]   Fax:  [phone]  Physical Therapy Treatment  Patient Details  Name: Corey Davila MRN: [id number] Date of Birth: 05-03-1924 Referring Provider:  Laroy Apple, MD  Encounter Date: 08/31/2014      PT End of Session - 08/31/14 1023    Visit Number 4   Number of Visits 12   Date for PT Re-Evaluation 09/16/14   PT Start Time 0929   PT Stop Time 1011   PT Time Calculation (min) 42 min   Activity Tolerance Patient tolerated treatment well   Behavior During Therapy [facility] for tasks assessed/performed      Past Medical History  Diagnosis Date  . Liver cancer     partial liver resection 06/18/2007; partial liver resection 08/29/2010, not renal  . History of kidney cancer 06/19/1979    L renal taken out   . Mixed hyperlipidemia   . Cough   . Arthritis   . Wears hearing aid     both ears    Past Surgical History  Procedure Laterality Date  . Nephrectomy  1980    left, [place] -cancer  . Partial hepatectomy  06/18/07    [facility]  R lobe-cancer  . Parttial hepatectomy  08/29/2010    [facility]  L lobe  . Colonoscopy    . Carpal tunnel release Left 09/10/2013    Procedure: LEFT CARPAL TUNNEL RELEASE;  Surgeon: Cammie Sickle., MD;  Location: [place];  Service: Orthopedics;  Laterality: Left;    There were no vitals taken for this visit.  Visit Diagnosis:  Hip pain, right      Subjective Assessment - 08/31/14 0932    Symptoms Reports pain is a 5/10 and has been hurting all week. Feels he hasn't been making much progress with therapy. Notices pain more in the center of his back/buttock.    Limitations Standing;Walking   How long can you stand comfortably? 10-15 minutes before reaching a 10/10   How long can you walk comfortably? 10-15 minutes before reaching a 10/10   Currently in Pain? Yes   Pain Score 5     Pain Location Buttocks   Pain Descriptors / Indicators Aching;Sharp;Dull   Pain Onset More than a month ago   Pain Frequency Intermittent   Multiple Pain Sites No          [facility] PT Assessment - 08/31/14 0001    Palpation   Palpation No tenderness noted along Rt ITB or glute                  [facility] Adult PT Treatment/Exercise - 08/31/14 0939    Exercises   Exercises Lumbar;Knee/Hip   Lumbar Exercises: Stretches   Passive Hamstring Stretch 2 reps;20 seconds  passive   Single Knee to Chest Stretch 2 reps;20 seconds  passive   Double Knee to Chest Stretch --  10 reps with 3 second holds; with orange pball   Lower Trunk Rotation Limitations 5 second holds x 10   with orange pball   Lumbar Exercises: Aerobic   Stationary Bike UE/LE level 5x 39minutes  Nustep   Lumbar Exercises: Seated   Other Seated Lumbar Exercises Childs pose with Pball 10 second hold x 5 Rt/Lt   Lumbar Exercises: Supine   Bridge 5 seconds;10 reps  ball between knees   Lumbar Exercises: Sidelying   Clam 10 reps  Lumbar Exercises: Quadruped   Opposite Arm/Leg Raise Right arm/Left leg;Left arm/Right leg;5 seconds;5 reps   Knee/Hip Exercises: Stretches   Passive Hamstring Stretch --   Piriformis Stretch --   Knee/Hip Exercises: Machines for Strengthening   Cybex Knee Extension x10  25 pounds   Cybex Knee Flexion x10  25 pounds                PT Education - 08/31/14 1022    Education provided Yes   Education Details Encourage continuation of HEP and gym activities   Person(s) Educated Patient   Methods Explanation   Comprehension Verbalized understanding             PT Long Term Goals - 08/31/14 0925    PT LONG TERM GOAL #1   Title demonstrate and/or verbalized technique to reduce the risk of re-injury to include info on posture/body mechanics. 09/16/14   Time 6   Period Weeks   Status New   PT LONG TERM GOAL #2   Title be independent with advanced HEP for hip  flexibility, strength, and balance. 09/16/14   Time 6   Period Weeks   Status New   PT LONG TERM GOAL #3   Title tolerate standing for 30 minutes with pain no greater than 3/10. 09/16/14   Time 6   Period Weeks   Status New   PT LONG TERM GOAL #4   Title tolerate sitting for 2 hours with pain no greater than 3/10. 09/16/14   Time 6   Period Weeks   Status New   PT LONG TERM GOAL #5   Title report able to walk on treadmill at least 10 minutes painfree. 09/16/14   Time 6   Period Weeks   Status New   Additional Long Term Goals   Additional Long Term Goals Yes   PT LONG TERM GOAL #6   Title improved outcome on FOTO functional reporting to no greater than 39% limitation. 09/16/14   Time 6   Period Weeks   Status New               Plan - 08/31/14 1023    Clinical Impression Statement Patient continues to tolerate more strengthening activities well and noted decreased pain at end of treatment   Pt will benefit from skilled therapeutic intervention in order to improve on the following deficits Decreased activity tolerance;Difficulty walking;Decreased strength   Rehab Potential Good   PT Frequency 2x / week   PT Duration 6 weeks   PT Treatment/Interventions Ultrasound;Moist Heat;Cryotherapy;Electrical Stimulation;Manual techniques;Therapeutic activities;Therapeutic exercise;Patient/family education;Traction;Gait training;Neuromuscular re-education;Passive range of motion   PT Next Visit Plan Assess pain and ROM/Strength   Consulted and Agree with Plan of Care Patient        Problem List Patient Active Problem List   Diagnosis Date Noted  . Aspiration pneumonia 12/03/2012  . Mixed hyperlipidemia   . Cough   . Malignant neoplasm of liver, primary 10/18/2011  . History of kidney cancer 06/19/1979    Barbette Hair PTA 08/31/2014, 10:30 AM  [facility] [address]  [place], [place], [address] Phone:  [phone]   Fax:  [phone]

## 2014-09-06 ENCOUNTER — Ambulatory Visit: Payer: Medicare Other

## 2014-09-06 DIAGNOSIS — M25551 Pain in right hip: Secondary | ICD-10-CM | POA: Diagnosis not present

## 2014-09-06 NOTE — Therapy (Signed)
[facility] [address]  [place], [place], [address] Phone: [phone]   Fax:  [phone]  Physical Therapy Treatment  Patient Details  Name: Corey Davila MRN: [id number] Date of Birth: 1923/11/27 Referring Provider:  Laroy Apple, MD  Encounter Date: 09/06/2014      PT End of Session - 09/06/14 1436    Visit Number 5   Number of Visits 12   Date for PT Re-Evaluation 09/16/14   PT Start Time 1350   PT Stop Time 1430   PT Time Calculation (min) 40 min   Activity Tolerance Patient tolerated treatment well   Behavior During Therapy [facility] for tasks assessed/performed      Past Medical History  Diagnosis Date  . Liver cancer     partial liver resection 06/18/2007; partial liver resection 08/29/2010, not renal  . History of kidney cancer 06/19/1979    L renal taken out   . Mixed hyperlipidemia   . Cough   . Arthritis   . Wears hearing aid     both ears    Past Surgical History  Procedure Laterality Date  . Nephrectomy  1980    left, [place] -cancer  . Partial hepatectomy  06/18/07    [facility]  R lobe-cancer  . Parttial hepatectomy  08/29/2010    [facility]  L lobe  . Colonoscopy    . Carpal tunnel release Left 09/10/2013    Procedure: LEFT CARPAL TUNNEL RELEASE;  Surgeon: Cammie Sickle., MD;  Location: [place];  Service: Orthopedics;  Laterality: Left;    There were no vitals taken for this visit.  Visit Diagnosis:  Hip pain, right      Subjective Assessment - 09/06/14 1354    Symptoms No pain currently; hasn't done anything.  Doesn't feel like any improvement since starting therapy.  Reports was hoping to have improvement to go on a cruise.   Limitations Standing;Walking   How long can you stand comfortably? 15 minutes   Currently in Pain? No/denies                    [facility] Adult PT Treatment/Exercise - 09/06/14 0001    Lumbar Exercises: Stretches   ITB Stretch 2 reps;30 seconds  passive in sidelying to right LE   Lumbar Exercises: Aerobic   Stationary Bike UE/LE level 5x 70minutes on nu step   Lumbar Exercises: Prone   Straight Leg Raise 10 reps  both legs pillows under belly    Lumbar Exercises: Quadruped   Other Quadruped Lumbar Exercises Child's pose mid/Rt/Lt x 2 teps 10 sec hold each position   Modalities   Modalities Ultrasound   Ultrasound   Ultrasound Location right ischium   Ultrasound Parameters 1.1 w/cm2, 8 min, pulsed 20% duty cycle   Ultrasound Goals Other (Comment)  inflammation                     PT Long Term Goals - 09/06/14 1439    PT LONG TERM GOAL #1   Title demonstrate and/or verbalized technique to reduce the risk of re-injury to include info on posture/body mechanics. 09/16/14   Status On-going   PT LONG TERM GOAL #2   Title be independent with advanced HEP for hip flexibility, strength, and balance. 09/16/14   Status On-going   PT LONG TERM GOAL #3   Title tolerate standing for 30 minutes with pain no greater than  3/10. 09/16/14   Status On-going   PT LONG TERM GOAL #4   Title tolerate sitting for 2 hours with pain no greater than 3/10. 09/16/14   Status On-going   PT LONG TERM GOAL #5   Title report able to walk on treadmill at least 10 minutes painfree. 09/16/14   Status On-going   PT LONG TERM GOAL #6   Title improved outcome on FOTO functional reporting to no greater than 39% limitation. 09/16/14   Status On-going               Plan - 09/06/14 1437    Clinical Impression Statement Patient willing to continue therapy with trying other modalities and stretching to see if can improve pain.  Will continue hip and core strengthening.   Pt will benefit from skilled therapeutic intervention in order to improve on the following deficits Impaired flexibility;Difficulty walking;Postural dysfunction;Decreased strength;Decreased mobility;Pain;Decreased activity tolerance   PT Next Visit Plan  Assess strength, ROM, treadmill walking   Consulted and Agree with Plan of Care Patient        Problem List Patient Active Problem List   Diagnosis Date Noted  . Aspiration pneumonia 12/03/2012  . Mixed hyperlipidemia   . Cough   . Malignant neoplasm of liver, primary 10/18/2011  . History of kidney cancer 06/19/1979    [facility] 09/06/2014, 2:41 PM  Magda Kiel, PT  09/06/2014  [facility] [address]  [place], [place], [address] Phone: [phone]   Fax:  [phone]

## 2014-09-13 ENCOUNTER — Ambulatory Visit: Payer: Medicare Other | Attending: Physical Medicine and Rehabilitation

## 2014-09-13 DIAGNOSIS — M25551 Pain in right hip: Secondary | ICD-10-CM | POA: Diagnosis not present

## 2014-09-13 NOTE — Therapy (Signed)
[facility] [address]  [place], [place], [address] Phone: [phone]   Fax:  [phone]  Physical Therapy Treatment  Patient Details  Name: Corey Davila MRN: [id number] Date of Birth: 11-25-1923 Referring Provider:  Laroy Apple, MD  Encounter Date: 09/13/2014      PT End of Session - 09/13/14 1115    Visit Number 6   Number of Visits 12   Date for PT Re-Evaluation 09/16/14   PT Start Time 2878   PT Stop Time 1107   PT Time Calculation (min) 52 min   Activity Tolerance Patient tolerated treatment well   Behavior During Therapy [facility] for tasks assessed/performed      Past Medical History  Diagnosis Date  . Liver cancer     partial liver resection 06/18/2007; partial liver resection 08/29/2010, not renal  . History of kidney cancer 06/19/1979    L renal taken out   . Mixed hyperlipidemia   . Cough   . Arthritis   . Wears hearing aid     both ears    Past Surgical History  Procedure Laterality Date  . Nephrectomy  1980    left, [place] -cancer  . Partial hepatectomy  06/18/07    [facility]  R lobe-cancer  . Parttial hepatectomy  08/29/2010    [facility]  L lobe  . Colonoscopy    . Carpal tunnel release Left 09/10/2013    Procedure: LEFT CARPAL TUNNEL RELEASE;  Surgeon: Cammie Sickle., MD;  Location: [place];  Service: Orthopedics;  Laterality: Left;    There were no vitals taken for this visit.  Visit Diagnosis:  Hip pain, right      Subjective Assessment - 09/13/14 1018    Symptoms Picked up sticks in my yard yesterday and pain up to 10.  Better with sitting and Ibuprofen.  Still can't stand more than 5 minutes   How long can you walk comfortably? 10-15 minutes before reaching a 10/10   Currently in Pain? No/denies   Pain Score 0-No pain   Pain Onset More than a month ago   Pain Frequency Intermittent                    [facility] Adult PT  Treatment/Exercise - 09/13/14 1023    Ambulation/Gait   Ambulation/Gait Yes   Ambulation/Gait Assistance 7: Independent   Ambulation Distance (Feet) 1148 Feet   Assistive device None   Gait Pattern Step-through pattern   Ambulation Surface Level;Indoor   Gait velocity approx 2.39 ft/sec   Lumbar Exercises: Stretches   Prone on Elbows Stretch 3 reps;10 seconds   Quadruped Mid Back Stretch 2 reps;10 seconds   Lumbar Exercises: Aerobic   Tread Mill 1.8-1.5 mph bilat UE support cues for posture, step lengthx 10'   Lumbar Exercises: Standing   Other Standing Lumbar Exercises standing leaning into counter with hips for extension stretch 3 x 10 seconds, followed by 2 x 10 seconds flexion stretch holding counter   Lumbar Exercises: Prone   Opposite Arm/Leg Raise Right arm/Left leg;Left arm/Right leg;5 reps;5 seconds   Other Prone Lumbar Exercises --   Lumbar Exercises: Quadruped   [place] 5 reps  5" holds with cues   Manual Therapy   Manual Therapy Joint mobilization   Joint Mobilization PA mobs grade III to lower thoracic and lumbar vertebrae in prone  PT Education - 09/13/14 1110    Education provided Yes   Education Details HEP, see pt instructions   Person(s) Educated Patient   Methods Explanation;Demonstration;Handout   Comprehension Verbalized understanding;Returned demonstration             PT Long Term Goals - 09/13/14 1117    PT LONG TERM GOAL #1   Title demonstrate and/or verbalized technique to reduce the risk of re-injury to include info on posture/body mechanics. 09/16/14   Status On-going   PT LONG TERM GOAL #2   Title be independent with advanced HEP for hip flexibility, strength, and balance. 09/16/14   Status On-going   PT LONG TERM GOAL #3   Title tolerate standing for 30 minutes with pain no greater than 3/10. 09/16/14   Status On-going   PT LONG TERM GOAL #4   Title tolerate sitting for 2 hours with pain no greater than 3/10.  09/16/14   Status On-going   PT LONG TERM GOAL #5   Title report able to walk on treadmill at least 10 minutes painfree. 09/16/14   Status Achieved   PT LONG TERM GOAL #6   Title improved outcome on FOTO functional reporting to no greater than 39% limitation. 09/16/14   Status On-going               Plan - 09/13/14 1116    Clinical Impression Statement Patient tolerated 10 minutes on treadmill no increased pain and able to walk 8 minutes level surfaces prior to starting to feel symptoms after doing extension exercises.  May benefit from renewal next session to continue working toward goals of increased tolerance to ADL's and walking.   Pt will benefit from skilled therapeutic intervention in order to improve on the following deficits Impaired flexibility;Difficulty walking;Postural dysfunction;Decreased strength;Decreased mobility;Pain;Decreased activity tolerance   Rehab Potential Good   PT Treatment/Interventions Ultrasound;Moist Heat;Cryotherapy;Electrical Stimulation;Manual techniques;Therapeutic activities;Therapeutic exercise;Patient/family education;Traction;Gait training;Neuromuscular re-education;Passive range of motion   PT Next Visit Plan Renewal for 4 more weeks, assess extension HEP.   Consulted and Agree with Plan of Care Patient        Problem List Patient Active Problem List   Diagnosis Date Noted  . Aspiration pneumonia 12/03/2012  . Mixed hyperlipidemia   . Cough   . Malignant neoplasm of liver, primary 10/18/2011  . History of kidney cancer 06/19/1979    [facility] 09/13/2014, 11:20 AM  Agapito Games, PT  [facility] [address]  [place], [place], [address] Phone: [phone]   Fax:  [phone]

## 2014-09-13 NOTE — Patient Instructions (Signed)
On Elbows (Prone)   Rise up on elbows as high as possible, keeping hips on floor. Hold __10__ seconds. Repeat __3-5__ times per set. Do __1__ sets per session. Do __1-2__ sessions per day.  http://orth.exer.us/92    Copyright  VHI. All rights reserved.  Bracing With Arm / Leg Lift (Prone)   With pillow support, lie on abdomen. Find neutral spine. Tighten pelvic floor and abdominals and hold. Alternately raise one arm and opposite leg. Repeat _3-5__ times. Do __1-2_ times a day.   Copyright  VHI. All rights reserved.  Knee Flexion Mobilization (All-Fours)   Slowly move trunk and hips back, bending knees, until a gentle stretch is felt. Hold ___10_ seconds. Relax. Repeat __3-5__ times per set. Do __1__ sets per session. Do _1-2___ sessions per day.  http://orth.exer.us/724

## 2014-09-15 ENCOUNTER — Ambulatory Visit: Payer: Medicare Other

## 2014-09-15 DIAGNOSIS — M25551 Pain in right hip: Secondary | ICD-10-CM

## 2014-09-15 NOTE — Therapy (Signed)
[facility] [address]  [place], [place], [address] Phone: [phone]   Fax:  [phone]  Physical Therapy Treatment/Renewal  Patient Details  Name: Corey Davila MRN: [id number] Date of Birth: [birth year] Referring Provider:  Laroy Apple, MD  Encounter Date: 09/15/2014      PT End of Session - 09/15/14 1155    Visit Number 7   Number of Visits 15   Date for PT Re-Evaluation 10/14/14   PT Start Time 1194   PT Stop Time 1101   PT Time Calculation (min) 46 min   Activity Tolerance Patient tolerated treatment well   Behavior During Therapy [facility] for tasks assessed/performed      Past Medical History  Diagnosis Date  . Liver cancer     partial liver resection 06/18/2007; partial liver resection 08/29/2010, not renal  . History of kidney cancer 06/19/1979    L renal taken out   . Mixed hyperlipidemia   . Cough   . Arthritis   . Wears hearing aid     both ears    Past Surgical History  Procedure Laterality Date  . Nephrectomy  1980    left, [place] -cancer  . Partial hepatectomy  06/18/07    [facility]  R lobe-cancer  . Parttial hepatectomy  08/29/2010    [facility]  L lobe  . Colonoscopy    . Carpal tunnel release Left 09/10/2013    Procedure: LEFT CARPAL TUNNEL RELEASE;  Surgeon: Cammie Sickle., MD;  Location: [place];  Service: Orthopedics;  Laterality: Left;    There were no vitals taken for this visit.  Visit Diagnosis:  Hip pain, right - Plan: PT plan of care cert/re-cert      Subjective Assessment - 09/15/14 1211    Symptoms Patient reports has not tried extension HEP due to being in pain, but then denies that there was increased pain after last treatment.  States, "Last treatment was a good one."  Patient wishes to continue therapy 4 more weeks to try to make improvements in standing and walking tolerance.   Limitations Standing;Walking   How long can you stand  comfortably? 15 minutes   How long can you walk comfortably? 10-15 minutes before reaching a 10/10   Currently in Pain? No/denies  but was up to 10/10 standing to dry dishes last night          [facility] PT Assessment - 09/15/14 1042    Observation/Other Assessments   Focus on Therapeutic Outcomes (FOTO)  44% limitation (inital 53% goal 39%)   ROM / Strength   AROM / PROM / Strength Strength;AROM   AROM   AROM Assessment Site Lumbar   Lumbar Flexion bends to reach fingertips about 20" from floor   Lumbar Extension excessive reaches with finertips to past knee   Lumbar - Right Side Bend limited about 25% reaches with fingertips above knee   Strength   Strength Assessment Site Hip;Knee;Ankle   Right/Left Hip Right;Left   Right Hip Flexion 4+/5   Right Hip Extension 4/5   Right Hip ABduction 4+/5   Left Hip Flexion 4+/5   Left Hip Extension 4/5   Left Hip ABduction 4+/5   Right/Left Knee Right;Left   Right Knee Flexion 5/5   Right Knee Extension 4+/5   Left Knee Flexion 5/5   Left Knee Extension 4+/5   Right/Left Ankle Right;Left   Right Ankle Dorsiflexion 5/5   Left  Ankle Dorsiflexion 5/5   Flexibility   Soft Tissue Assessment /Muscle Length yes   Hamstrings knee extension hip at 90 Rt. 50, Lt. 55                  [facility] Adult PT Treatment/Exercise - 09/15/14 1023    Lumbar Exercises: Stretches   Active Hamstring Stretch 3 reps;20 seconds  bilateral with strap and cues for hold time   Piriformis Stretch 2 reps;20 seconds   Lumbar Exercises: Aerobic   Tread Mill 1.4 mph Bilat UE support cues and assist for posture and step length   Lumbar Exercises: Supine   Bridge 5 seconds;10 reps  right leg only    Lumbar Exercises: Sidelying   Other Sidelying Lumbar Exercises side crunch to right 5 sec hold x 5 reps   Lumbar Exercises: Prone   Other Prone Lumbar Exercises prone press up onto forearms 3 x 10 sec hold   Lumbar Exercises: Quadruped   Opposite Arm/Leg Raise  Right arm/Left leg;Left arm/Right leg  2 reps, c/o left wrist pain so stopped   Other Quadruped Lumbar Exercises Child's pose 3 reps x 20 sec hold   Knee/Hip Exercises: Prone   Hip Extension Strengthening;10 reps;Both  alternating 3 sec holds                      PT Long Term Goals - 09/15/14 1158    PT LONG TERM GOAL #1   Title demonstrate and/or verbalized technique to reduce the risk of re-injury to include info on posture/body mechanics. 10/14/14   Time 4   Period Weeks   Status On-going   PT LONG TERM GOAL #2   Title be independent with advanced HEP for hip flexibility, strength, and balance. 10/14/14   Time 4   Period Weeks   Status On-going   PT LONG TERM GOAL #3   Title tolerate standing for 30 minutes with pain no greater than 3/10. 10/14/14   Time 4   Period Weeks   Status On-going   PT LONG TERM GOAL #4   Title tolerate sitting for 2 hours with pain no greater than 3/10.  10/14/14   Time 4   Period Weeks   Status On-going   PT LONG TERM GOAL #5   Title report able to walk on treadmill at least 10 minutes painfree. 09/16/14   Status Achieved   PT LONG TERM GOAL #6   Title improved outcome on FOTO functional reporting to no greater than 39% limitation. 10/14/14   Time 4   Period Weeks   Status On-going               Plan - 09/15/14 1156    Clinical Impression Statement Patient feels some improvements, but not as much as needed to tolerate walking longer or standing longer.  Still with significant hamstring tightness and weakness in core after multiple surgeries through abdominal musculature.  Tolerating exension based activities and has incresaed lumbar extension ROM.  Feel continued skilled PT x 4 more weeks may achieve patient's goals for standing/walking tolerance.   Pt will benefit from skilled therapeutic intervention in order to improve on the following deficits Impaired flexibility;Difficulty walking;Postural dysfunction;Decreased strength;Decreased  mobility;Pain;Decreased activity tolerance   Rehab Potential Good   PT Frequency 2x / week   PT Duration 4 weeks   PT Treatment/Interventions Ultrasound;Moist Heat;Cryotherapy;Electrical Stimulation;Manual techniques;Therapeutic activities;Therapeutic exercise;Patient/family education;Traction;Gait training;Neuromuscular re-education;Passive range of motion   PT Next Visit Plan Trial mechanical traction  50-60# due to abdominal scarring.  Continue extension based if tolerated.   Consulted and Agree with Plan of Care Patient        Problem List Patient Active Problem List   Diagnosis Date Noted  . Aspiration pneumonia 12/03/2012  . Mixed hyperlipidemia   . Cough   . Malignant neoplasm of liver, primary 10/18/2011  . History of kidney cancer 06/19/1979    [facility] 09/15/2014, 12:13 PM  Magda Kiel, PT  [facility] [address]  [place], [place], [address] Phone: [phone]   Fax:  [phone]

## 2014-09-20 ENCOUNTER — Encounter: Payer: Self-pay | Admitting: Rehabilitation

## 2014-09-20 ENCOUNTER — Ambulatory Visit: Payer: Medicare Other | Admitting: Rehabilitation

## 2014-09-20 DIAGNOSIS — M25551 Pain in right hip: Secondary | ICD-10-CM

## 2014-09-20 NOTE — Patient Instructions (Addendum)
Patient instructed to continue with home stretches until seeing MD again. Reports no new handouts needed.

## 2014-09-20 NOTE — Therapy (Addendum)
[facility] [address]  [place], [place], [address] Phone: [phone]   Fax:  [phone]  Physical Therapy Treatment  Patient Details  Name: Corey Davila MRN: [id number] Date of Birth: 1924-07-08 Referring Provider:  Laroy Apple, MD  Encounter Date: 09/20/2014      PT End of Session - 09/20/14 1005    Visit Number 8   Number of Visits 15   Date for PT Re-Evaluation 10/14/14   PT Start Time 0849   PT Stop Time 0930   PT Time Calculation (min) 41 min   Activity Tolerance Patient tolerated treatment well      There were no vitals taken for this visit.  Visit Diagnosis:  Hip pain, right      Subjective Assessment - 09/20/14 0848    Symptoms Reports significant increase in pain yesterday to 10/10. No reason. Will be seeing 2 new doctors regarding the pain and wants to put therapy on hold until then.  Does want to try the normal exercises today though. The MD appointment is 3/17. Reports he feels still at "day one". Not doing any extension exercises due to increased pain but is doing stretches   How long can you sit comfortably? 42min   How long can you stand comfortably? 88min   How long can you walk comfortably? 10-40min before 10/10   Currently in Pain? Yes   Pain Score 1   still gets to a 10/10 with walking   Pain Location --  R glute when it hurts   Aggravating Factors  walking   Pain Relieving Factors medication          [facility] PT Assessment - 09/20/14 0001    Observation/Other Assessments   Focus on Therapeutic Outcomes (FOTO)  increase in limitation to 51%                  [facility] Adult PT Treatment/Exercise - 09/20/14 0001    Lumbar Exercises: Stretches   Active Hamstring Stretch 3 reps;30 seconds  strap bilateral   Single Knee to Chest Stretch 2 reps;20 seconds   Double Knee to Chest Stretch 2 reps;20 seconds   Piriformis Stretch 2 reps;20 seconds  bilateral   Lumbar  Exercises: Supine   Bridge 10 reps;5 seconds   Lumbar Exercises: Quadruped   Straight Leg Raise 5 reps  due to too much pain during UE/LE   Other Quadruped Lumbar Exercises Child's pose 3 reps x 20 sec hold                     PT Long Term Goals - 09/20/14 9675    PT LONG TERM GOAL #1   Title demonstrate and/or verbalized technique to reduce the risk of re-injury to include info on posture/body mechanics. 10/14/14   Status Achieved   PT LONG TERM GOAL #2   Title be independent with advanced HEP for hip flexibility, strength, and balance. 10/14/14   Status Achieved   PT LONG TERM GOAL #3   Title tolerate standing for 30 minutes with pain no greater than 3/10. 10/14/14   Status Not Met   PT LONG TERM GOAL #4   Title tolerate sitting for 2 hours with pain no greater than 3/10.  10/14/14   Status Not Met               Plan - 09/20/14 0917    Clinical Impression Statement Would like to be placed on  hold x 30days or until after the MD appointments. FOTO decrease in limitations and no changes subjectively in status.        Problem List Patient Active Problem List   Diagnosis Date Noted  . Aspiration pneumonia 12/03/2012  . Mixed hyperlipidemia   . Cough   . Malignant neoplasm of liver, primary 10/18/2011  . History of kidney cancer 06/19/1979    Stark Bray, DPT, CMP 09/20/2014, 10:07 AM  [facility] [address]  [place], [place], [address] Phone: [phone]   Fax:  [phone]    PHYSICAL THERAPY DISCHARGE SUMMARY  Visits from Start of Care: 8  Current functional level related to goals / functional outcomes: See above regarding goals and status   Remaining deficits: No significant changes over PT session   Education / Equipment: HEP Plan: Patient agrees to discharge.  Patient goals were partially met. Patient is being discharged due to lack of progress.  ?????     Shan Levans,  DPT, CMP

## 2014-09-23 ENCOUNTER — Ambulatory Visit: Payer: Medicare Other | Admitting: Rehabilitation

## 2014-09-28 ENCOUNTER — Ambulatory Visit: Payer: Medicare Other | Admitting: Rehabilitation

## 2014-09-29 DIAGNOSIS — M4696 Unspecified inflammatory spondylopathy, lumbar region: Secondary | ICD-10-CM | POA: Diagnosis not present

## 2014-09-30 ENCOUNTER — Ambulatory Visit: Payer: Medicare Other | Admitting: Rehabilitation

## 2014-09-30 DIAGNOSIS — R351 Nocturia: Secondary | ICD-10-CM | POA: Diagnosis not present

## 2014-09-30 DIAGNOSIS — N401 Enlarged prostate with lower urinary tract symptoms: Secondary | ICD-10-CM | POA: Diagnosis not present

## 2014-10-03 ENCOUNTER — Ambulatory Visit: Payer: Medicare Other | Admitting: Rehabilitation

## 2014-10-06 ENCOUNTER — Ambulatory Visit: Payer: Medicare Other | Admitting: Rehabilitation

## 2014-10-06 DIAGNOSIS — M1611 Unilateral primary osteoarthritis, right hip: Secondary | ICD-10-CM | POA: Diagnosis not present

## 2014-10-06 DIAGNOSIS — M25551 Pain in right hip: Secondary | ICD-10-CM | POA: Diagnosis not present

## 2014-10-08 ENCOUNTER — Emergency Department (HOSPITAL_BASED_OUTPATIENT_CLINIC_OR_DEPARTMENT_OTHER): Payer: Medicare Other

## 2014-10-08 ENCOUNTER — Encounter (HOSPITAL_BASED_OUTPATIENT_CLINIC_OR_DEPARTMENT_OTHER): Payer: Self-pay

## 2014-10-08 ENCOUNTER — Observation Stay (HOSPITAL_BASED_OUTPATIENT_CLINIC_OR_DEPARTMENT_OTHER)
Admission: EM | Admit: 2014-10-08 | Discharge: 2014-10-09 | Disposition: A | Discharge status: Home / Self Care | Payer: Medicare Other | Attending: Internal Medicine | Admitting: Internal Medicine

## 2014-10-08 DIAGNOSIS — R109 Unspecified abdominal pain: Secondary | ICD-10-CM

## 2014-10-08 DIAGNOSIS — S2249XA Multiple fractures of ribs, unspecified side, initial encounter for closed fracture: Secondary | ICD-10-CM | POA: Diagnosis present

## 2014-10-08 DIAGNOSIS — Z823 Family history of stroke: Secondary | ICD-10-CM | POA: Insufficient documentation

## 2014-10-08 DIAGNOSIS — Z882 Allergy status to sulfonamides status: Secondary | ICD-10-CM | POA: Diagnosis not present

## 2014-10-08 DIAGNOSIS — S2232XA Fracture of one rib, left side, initial encounter for closed fracture: Secondary | ICD-10-CM | POA: Diagnosis not present

## 2014-10-08 DIAGNOSIS — Z91030 Bee allergy status: Secondary | ICD-10-CM | POA: Diagnosis not present

## 2014-10-08 DIAGNOSIS — S2239XA Fracture of one rib, unspecified side, initial encounter for closed fracture: Secondary | ICD-10-CM | POA: Diagnosis present

## 2014-10-08 DIAGNOSIS — Z888 Allergy status to other drugs, medicaments and biological substances status: Secondary | ICD-10-CM | POA: Diagnosis not present

## 2014-10-08 DIAGNOSIS — Z85528 Personal history of other malignant neoplasm of kidney: Secondary | ICD-10-CM | POA: Diagnosis not present

## 2014-10-08 DIAGNOSIS — E782 Mixed hyperlipidemia: Secondary | ICD-10-CM | POA: Diagnosis not present

## 2014-10-08 DIAGNOSIS — S3991XA Unspecified injury of abdomen, initial encounter: Secondary | ICD-10-CM | POA: Diagnosis not present

## 2014-10-08 DIAGNOSIS — R911 Solitary pulmonary nodule: Secondary | ICD-10-CM | POA: Diagnosis not present

## 2014-10-08 DIAGNOSIS — Z8505 Personal history of malignant neoplasm of liver: Secondary | ICD-10-CM | POA: Diagnosis not present

## 2014-10-08 DIAGNOSIS — R079 Chest pain, unspecified: Secondary | ICD-10-CM | POA: Diagnosis present

## 2014-10-08 DIAGNOSIS — W208XXA Other cause of strike by thrown, projected or falling object, initial encounter: Secondary | ICD-10-CM | POA: Insufficient documentation

## 2014-10-08 DIAGNOSIS — Z7982 Long term (current) use of aspirin: Secondary | ICD-10-CM | POA: Insufficient documentation

## 2014-10-08 DIAGNOSIS — S2242XA Multiple fractures of ribs, left side, initial encounter for closed fracture: Secondary | ICD-10-CM | POA: Diagnosis not present

## 2014-10-08 DIAGNOSIS — S299XXA Unspecified injury of thorax, initial encounter: Secondary | ICD-10-CM | POA: Diagnosis not present

## 2014-10-08 LAB — BASIC METABOLIC PANEL
ANION GAP: 9 (ref 5–15)
BUN: 25 mg/dL — ABNORMAL HIGH (ref 6–23)
CHLORIDE: 101 mmol/L (ref 96–112)
CO2: 26 mmol/L (ref 19–32)
CREATININE: 1.02 mg/dL (ref 0.50–1.35)
Calcium: 9.4 mg/dL (ref 8.4–10.5)
GFR calc Af Amer: 74 mL/min — ABNORMAL LOW (ref >90)
GFR calc non Af Amer: 63 mL/min — ABNORMAL LOW (ref >90)
Glucose, Bld: 112 mg/dL — ABNORMAL HIGH (ref 70–99)
Potassium: 4.2 mmol/L (ref 3.5–5.1)
SODIUM: 136 mmol/L (ref 135–145)

## 2014-10-08 LAB — CBC WITH DIFFERENTIAL/PLATELET
BAND NEUTROPHILS: 2 % (ref 0–10)
BASOS PCT: 0 % (ref 0–1)
Basophils Absolute: 0 10*3/uL (ref 0.0–0.1)
Eosinophils Absolute: 0 10*3/uL (ref 0.0–0.7)
Eosinophils Relative: 0 % (ref 0–5)
HCT: 42.3 % (ref 39.0–52.0)
Hemoglobin: 14 g/dL (ref 13.0–17.0)
LYMPHS PCT: 12 % (ref 12–46)
Lymphs Abs: 1.1 10*3/uL (ref 0.7–4.0)
MCH: 32.5 pg (ref 26.0–34.0)
MCHC: 33.1 g/dL (ref 30.0–36.0)
MCV: 98.1 fL (ref 78.0–100.0)
Metamyelocytes Relative: 1 %
Monocytes Absolute: 0.5 10*3/uL (ref 0.1–1.0)
Monocytes Relative: 5 % (ref 3–12)
NEUTROS PCT: 80 % — AB (ref 43–77)
Neutro Abs: 7.8 10*3/uL — ABNORMAL HIGH (ref 1.7–7.7)
PLATELETS: 188 10*3/uL (ref 150–400)
RBC: 4.31 MIL/uL (ref 4.22–5.81)
RDW: 13.1 % (ref 11.5–15.5)
WBC: 9.4 10*3/uL (ref 4.0–10.5)

## 2014-10-08 LAB — TROPONIN I: Troponin I: <0.03 ng/mL (ref <0.031)

## 2014-10-08 MED ORDER — SODIUM CHLORIDE 0.9 % IJ SOLN: 3.0000 mL | INTRAMUSCULAR | Status: DC | PRN

## 2014-10-08 MED ORDER — MORPHINE SULFATE 4 MG/ML IJ SOLN
4.0000 mg | Freq: Once | INTRAMUSCULAR | Status: AC
  Administered 2014-10-08: 4 mg via INTRAVENOUS
  Filled 2014-10-08: qty 1

## 2014-10-08 MED ORDER — MORPHINE SULFATE 4 MG/ML IJ SOLN
INTRAMUSCULAR | Status: AC
  Filled 2014-10-08: qty 1

## 2014-10-08 MED ORDER — ASPIRIN EC 81 MG PO TBEC
81.0000 mg | DELAYED_RELEASE_TABLET | Freq: Every day | ORAL | Status: DC
  Administered 2014-10-08: 81 mg via ORAL
  Filled 2014-10-08 (×2): qty 1

## 2014-10-08 MED ORDER — TRAMADOL HCL 50 MG PO TABS
50.0000 mg | ORAL_TABLET | Freq: Four times a day (QID) | ORAL | Status: DC | PRN
  Administered 2014-10-08 – 2014-10-09 (×3): 50 mg via ORAL
  Filled 2014-10-08 (×3): qty 1

## 2014-10-08 MED ORDER — DOCUSATE SODIUM 100 MG PO CAPS
100.0000 mg | ORAL_CAPSULE | Freq: Every day | ORAL | Status: DC
  Administered 2014-10-08: 100 mg via ORAL
  Filled 2014-10-08 (×2): qty 1

## 2014-10-08 MED ORDER — SODIUM CHLORIDE 0.9 % IV SOLN: 250.0000 mL | INTRAVENOUS | Status: DC | PRN

## 2014-10-08 MED ORDER — SODIUM CHLORIDE 0.9 % IV BOLUS (SEPSIS)
500.0000 mL | Freq: Once | INTRAVENOUS | Status: AC
  Administered 2014-10-08: 500 mL via INTRAVENOUS

## 2014-10-08 MED ORDER — ENOXAPARIN SODIUM 40 MG/0.4ML ~~LOC~~ SOLN
40.0000 mg | SUBCUTANEOUS | Status: DC
  Administered 2014-10-08: 40 mg via SUBCUTANEOUS
  Filled 2014-10-08 (×2): qty 0.4

## 2014-10-08 MED ORDER — IOHEXOL 300 MG/ML  SOLN
100.0000 mL | Freq: Once | INTRAMUSCULAR | Status: AC | PRN
  Administered 2014-10-08: 100 mL via INTRAVENOUS

## 2014-10-08 MED ORDER — CYANOCOBALAMIN 250 MCG PO TABS
250.0000 ug | ORAL_TABLET | Freq: Every day | ORAL | Status: DC
  Administered 2014-10-08: 250 ug via ORAL
  Filled 2014-10-08 (×2): qty 1

## 2014-10-08 MED ORDER — IBUPROFEN 200 MG PO TABS
600.0000 mg | ORAL_TABLET | Freq: Three times a day (TID) | ORAL | Status: DC | PRN
  Administered 2014-10-09: 600 mg via ORAL
  Filled 2014-10-08: qty 3

## 2014-10-08 MED ORDER — DOCUSATE SODIUM 100 MG PO CAPS: 100.0000 mg | ORAL_CAPSULE | Freq: Two times a day (BID) | ORAL | Status: DC

## 2014-10-08 MED ORDER — ROSUVASTATIN CALCIUM 20 MG PO TABS
20.0000 mg | ORAL_TABLET | ORAL | Status: DC
  Filled 2014-10-08: qty 1

## 2014-10-08 MED ORDER — SODIUM CHLORIDE 0.9 % IJ SOLN
3.0000 mL | Freq: Two times a day (BID) | INTRAMUSCULAR | Status: DC
  Administered 2014-10-08: 3 mL via INTRAVENOUS

## 2014-10-08 MED ORDER — MORPHINE SULFATE 4 MG/ML IJ SOLN
4.0000 mg | Freq: Once | INTRAMUSCULAR | Status: AC
  Administered 2014-10-08: 4 mg via INTRAMUSCULAR
  Filled 2014-10-08: qty 1

## 2014-10-08 MED ORDER — DESMOPRESSIN ACETATE 0.1 MG PO TABS
100.0000 ug | ORAL_TABLET | Freq: Every day | ORAL | Status: DC
  Administered 2014-10-08: 100 ug via ORAL
  Filled 2014-10-08 (×2): qty 1

## 2014-10-08 MED ORDER — FINASTERIDE 5 MG PO TABS
5.0000 mg | ORAL_TABLET | Freq: Every day | ORAL | Status: DC
  Administered 2014-10-08: 5 mg via ORAL
  Filled 2014-10-08 (×2): qty 1

## 2014-10-08 MED ORDER — SODIUM CHLORIDE 0.9 % IJ SOLN: 3.0000 mL | Freq: Two times a day (BID) | INTRAMUSCULAR | Status: DC

## 2014-10-08 NOTE — ED Notes (Signed)
Pt in CT  Will instruct IS after return

## 2014-10-08 NOTE — ED Provider Notes (Signed)
Patient visit shared with Dr. Reather Converse.  Pt here for evaluation of injuries following a mechanical fall.  Imaging demonstrates three nondisplaced rib fractures.  Plan to admit for pain control due to ongoing pain and splinting in ED.  Discussed with Dr. Brantley Stage with Trauma service - will see in consult with Medicine admission.  CT demonstrates multiple pulmonary nodules concerning for metastatic disease (patient has hx/o two separate primary hepatic cancers as well as renal cell carcinoma).  D/w patient and family findings of CT scan.  [facility] consulted for admission.    Corey Reichert, MD 10/08/14 [phone]

## 2014-10-08 NOTE — ED Notes (Signed)
Pt was outside working in yard and had mechanical fall backwards while working on tree.  Denies hitting head or loc.  Reported laceration to right arm, currently dressed, and having pain in bilateral posterior rib areas.

## 2014-10-08 NOTE — H&P (Addendum)
Corey Davila is an 81 y.o. male.   Chief Complaint: fall HPI: 81 yo male with hx of kidney and liver  Cancer apparently fell on left side while trying to prune tree earlier today. Pt denies fever, chills, cough, palp, sob, n/v, diarrhea, brbpr, black stool.  Pt is being admitted for pain control, as well as multiple nodules in his lung.   Past Medical History  Diagnosis Date  . Liver cancer     partial liver resection 06/18/2007; partial liver resection 08/29/2010, not renal  . History of kidney cancer 06/19/1979    L renal taken out   . Mixed hyperlipidemia   . Cough   . Arthritis   . Wears hearing aid     both ears    Past Surgical History  Procedure Laterality Date  . Nephrectomy  1980    left, [place] -cancer  . Partial hepatectomy  06/18/07    [facility]  R lobe-cancer  . Parttial hepatectomy  08/29/2010    [facility]  L lobe  . Colonoscopy    . Carpal tunnel release Left 09/10/2013    Procedure: LEFT CARPAL TUNNEL RELEASE;  Surgeon: Cammie Sickle., MD;  Location: [facility];  Service: Orthopedics;  Laterality: Left;    Family History  Problem Relation Age of Onset  . Stroke Father    Social History:  reports that he has never smoked. He has never used smokeless tobacco. He reports that he does not drink alcohol or use illicit drugs.  Allergies:  Allergies  Allergen Reactions  . Tessalon [Benzonatate]   . Tussicaps [Hydrocod Polst-Cpm Polst Er]   . Bee Venom   . Dilantin [Phenytoin]   . Mevacor [Lovastatin]   . Niacin And Related     Rash   . Septra Ds [Sulfamethoxazole-Trimethoprim]   Medications reviewed  Medications Prior to Admission  Medication Sig Dispense Refill  . Calcium Citrate 200 MG TABS Take 1 tablet by mouth daily.    . Calcium-Vit D3-Phytosterols 032-122-482 MG-UNIT-MG TABS Take 1 tablet by mouth daily.    Marland Kitchen docusate sodium (COLACE) 100 MG capsule Take 100 mg by mouth daily.    . Glucosamine-Chondroitin 250-200 MG TABS  Take 1 tablet by mouth daily. Regular strength tabs.    Marland Kitchen HYDROcodone-acetaminophen (NORCO/VICODIN) 5-325 MG per tablet     . LYCOPENE PO Take 1 tablet by mouth daily.    . methylPREDNIsolone (MEDROL DOSPACK) 4 MG tablet follow package directions    . predniSONE (DELTASONE) 5 MG tablet Take 5 mg by mouth.    . traMADol (ULTRAM) 50 MG tablet Take 50 mg by mouth.    Marland Kitchen aspirin 81 MG tablet Take 81 mg by mouth daily.    . CRESTOR 20 MG tablet Take 1 tablet by mouth every other day.    . desmopressin (DDAVP) 0.2 MG tablet Take 0.5 tablets by mouth at bedtime.    . finasteride (PROSCAR) 5 MG tablet Take 1 tablet by mouth daily.    . Milk Thistle 250 MG CAPS Take 1 capsule by mouth daily.    . Misc Natural Products (OSTEO BI-FLEX JOINT SHIELD PO) Take 1 tablet by mouth 2 (two) times daily.    . Multiple Minerals-Vitamins (CALCIUM CITRATE PLUS/MAGNESIUM PO) Take 1 tablet by mouth daily.    . Multiple Vitamin (MULTIVITAMIN) tablet Take 1 tablet by mouth daily.    . vitamin B-12 (CYANOCOBALAMIN) 250 MCG tablet Take 250 mcg by mouth daily.  Results for orders placed or performed during the hospital encounter of 10/08/14 (from the past 48 hour(s))  CBC with Differential     Status: Abnormal   Collection Time: 10/08/14  2:35 PM  Result Value Ref Range   WBC 9.4 4.0 - 10.5 K/uL   RBC 4.31 4.22 - 5.81 MIL/uL   Hemoglobin 14.0 13.0 - 17.0 g/dL   HCT 42.3 39.0 - 52.0 %   MCV 98.1 78.0 - 100.0 fL   MCH 32.5 26.0 - 34.0 pg   MCHC 33.1 30.0 - 36.0 g/dL   RDW 13.1 11.5 - 15.5 %   Platelets 188 150 - 400 K/uL   Neutrophils Relative % 80 (H) 43 - 77 %   Lymphocytes Relative 12 12 - 46 %   Monocytes Relative 5 3 - 12 %   Eosinophils Relative 0 0 - 5 %   Basophils Relative 0 0 - 1 %   Band Neutrophils 2 0 - 10 %   Metamyelocytes Relative 1 %   Neutro Abs 7.8 (H) 1.7 - 7.7 K/uL   Lymphs Abs 1.1 0.7 - 4.0 K/uL   Monocytes Absolute 0.5 0.1 - 1.0 K/uL   Eosinophils Absolute 0.0 0.0 - 0.7 K/uL    Basophils Absolute 0.0 0.0 - 0.1 K/uL   RBC Morphology MIXED RBC POPULATION    WBC Morphology VACUOLATED NEUTROPHILS   Basic metabolic panel     Status: Abnormal   Collection Time: 10/08/14  2:35 PM  Result Value Ref Range   Sodium 136 135 - 145 mmol/L   Potassium 4.2 3.5 - 5.1 mmol/L   Chloride 101 96 - 112 mmol/L   CO2 26 19 - 32 mmol/L   Glucose, Bld 112 (H) 70 - 99 mg/dL   BUN 25 (H) 6 - 23 mg/dL   Creatinine, Ser 1.02 0.50 - 1.35 mg/dL   Calcium 9.4 8.4 - 10.5 mg/dL   GFR calc non Af Amer 63 (L) >90 mL/min   GFR calc Af Amer 74 (L) >90 mL/min    Comment: (NOTE) The eGFR has been calculated using the CKD EPI equation. This calculation has not been validated in all clinical situations. eGFR's persistently <90 mL/min signify possible Chronic Kidney Disease.    Anion gap 9 5 - 15   Dg Chest 2 View  10/08/2014   CLINICAL DATA:  Pain following fall  EXAM: CHEST  2 VIEW  COMPARISON:  December 31, 2012  FINDINGS: There is no edema or consolidation. On the lateral view, there is a 1.1 x 1.0 cm nodular opacity overlying a lower thoracic vertebral body. This finding was not present on prior chest radiographic examination.  Heart size and pulmonary vascularity are within normal limits. No adenopathy.  There are acute appearing fractures of the lateral left fifth, sixth, and seventh ribs. There is no demonstrable pneumothorax. There is a skin fold on the left, however.  IMPRESSION: Nodular opacity posterior basilar region seen only on the lateral view. This finding was not present on prior study. Advise noncontrast enhanced chest CT to further assess.  No edema or consolidation.  Nondisplaced acute appearing fractures of the lateral left fifth, sixth, and seventh ribs are noted. No apparent pneumothorax.   Electronically Signed   By: Lowella Grip III M.D.   On: 10/08/2014 13:41   Dg Thoracic Spine 2 View  10/08/2014   CLINICAL DATA:  Fall.  Upper left chest pain.  EXAM: THORACIC SPINE - 2 VIEW   COMPARISON:  Chest radiograph  10/08/2014  FINDINGS: Surgical clips in the upper abdomen. There is scoliosis in the lumbar junction. Thoracic vertebral body heights are maintained. No evidence for a compression deformity in the thoracic spine. Alignment at the cervical-thoracic junction is within normal limits.  IMPRESSION: No acute bone abnormality in the thoracic spine.  Scoliosis in the lumbar spine.   Electronically Signed   By: Markus Daft M.D.   On: 10/08/2014 13:43   Ct Chest W Contrast  10/08/2014   CLINICAL DATA:  81 year old male with history of trauma from a fall today in his yard complaining of left-sided flank pain and left posterior/lateral rib pain with inspiration. History of renal cancer and liver cancer.  EXAM: CT CHEST, ABDOMEN, AND PELVIS WITH CONTRAST  TECHNIQUE: Multidetector CT imaging of the chest, abdomen and pelvis was performed following the standard protocol during bolus administration of intravenous contrast.  CONTRAST:  153mL OMNIPAQUE IOHEXOL 300 MG/ML  SOLN  COMPARISON:  CT of the abdomen and pelvis 04/13/2007.  FINDINGS: CT CHEST FINDINGS  Mediastinum/Lymph Nodes: No abnormal high attenuation fluid within the mediastinum to suggest posttraumatic mediastinal hematoma. No evidence of posttraumatic aortic dissection/transection. Heart size is normal. There is no significant pericardial fluid, thickening or pericardial calcification. Severe calcifications and thickening of the aortic valve. There is atherosclerosis of the thoracic aorta, the great vessels of the mediastinum and the coronary arteries, including calcified atherosclerotic plaque in the left anterior descending, left circumflex and right coronary arteries. In addition, there is ectasia of the ascending thoracic aorta (4.0 cm in diameter). Borderline enlarged right hilar lymph node measuring 9 mm (nonspecific). No other pathologically enlarged mediastinal or hilar lymph nodes are noted. Esophagus is unremarkable in  appearance. No axillary lymphadenopathy.  Lungs/Pleura: Multiple pulmonary nodules are noted throughout the lungs bilaterally, concerning for potential metastatic disease. The largest of these nodules is in the right lower lobe (image 42 of series 4) measuring 1 cm. The largest nodule in the left lung is in the left lower lobe measuring 9 mm (image 45 of series 4). No acute consolidative airspace disease. No pleural effusions. No pneumothorax. Mild dependent subsegmental atelectasis in the lower lobes of the lungs bilaterally.  Musculoskeletal/Soft Tissues: Acute nondisplaced fractures of the lateral aspects of the left fifth, sixth and seventh ribs are noted. There is a tiny loculated of gas in the left chest wall musculature (image 47 of series 4). There are no aggressive appearing lytic or blastic lesions noted in the visualized portions of the skeleton.  CT ABDOMEN AND PELVIS FINDINGS  Hepatobiliary: Postoperative changes of partial right hepatectomy. Capsular retraction and focal calcification in the anterior aspect of the left lobe of the liver, new compared to prior examinations, potentially treatment related. 12 mm ill-defined hypovascular lesion in the central aspect of the liver near the dome (image 51 of series 2), new compared to prior examinations. Status post cholecystectomy. No signs of acute traumatic injury to the liver.  Pancreas: Pancreatic duct is slightly prominent measuring up to 4 mm. No definite obstructing mass is identified in the head of the pancreas. No pancreatic or peripancreatic inflammatory changes. Numerous surgical clips are noted adjacent to the body of the pancreas.  Spleen: Unremarkable. Specifically, no signs of acute traumatic injury to the spleen.  Adrenals/Urinary Tract: Status post left nephrectomy. No soft tissue mass in the left retroperitoneum to suggest local recurrence of disease. Left adrenal gland is not confidently identified may be surgically absent as well. Right  adrenal gland is normal in appearance.  Two low-attenuation lesions in the upper pole of the right kidney are compatible with simple cysts, largest of which measures 2.2 cm in diameter. No right-sided hydroureteronephrosis. Urinary bladder wall is mildly thickened and irregular, with multiple small bladder wall diverticulae.  Stomach/Bowel: The appearance of the stomach is normal. No pathologic dilatation of small bowel or colon. A few colonic diverticulae are noted, without surrounding inflammatory changes to suggest an acute diverticulitis at this time. Normal appendix.  Vascular/Lymphatic: Atherosclerosis throughout the abdominal and pelvic vasculature, without evidence of aneurysm or dissection. No lymphadenopathy noted in the abdomen or pelvis.  Reproductive: Prostate gland and seminal vesicles are unremarkable in appearance.  Other: Small bilateral inguinal hernias. The left inguinal hernia contains a small volume of fat. The right inguinal hernia contains a short segment of distal small bowel (presumably ileum), without evidence of incarceration or obstruction at this time. No high attenuation intraperitoneal or retroperitoneal fluid collections to suggest significant posttraumatic hemorrhage. No significant volume of ascites. No pneumoperitoneum.  Musculoskeletal: S-shaped scoliosis of the lumbar spine convex to the right superiorly and to the left inferiorly. Multilevel degenerative disc disease. No acute displaced fractures or aggressive appearing lytic or blastic lesions are noted in the visualized portions of the skeleton.  IMPRESSION: 1. Nondisplaced fractures of the lateral aspects of the left fifth, sixth and seventh ribs. No associated pneumothorax. 2. No other signs of significant acute traumatic injury to the chest, abdomen or pelvis. 3. However, there are multiple pulmonary nodules in lungs bilaterally, highly concerning for metastatic disease. Correlation with PET-CT and/or biopsy in the near  future is suggested if clinically appropriate. 4. Status post partial right hepatectomy. Probable treatment related changes in the left lobe of the liver, where there is also a focal capsular retraction and calcification. 5. Status post left nephrectomy. 6. Atherosclerosis, including 3 vessel coronary artery disease. In addition, there is mild ectasia of the ascending thoracic aorta (4.0 cm in diameter). Recommend annual imaging followup by CTA or MRA. This recommendation follows 2010 ACCF/AHA/AATS/ACR/ASA/SCA/SCAI/SIR/STS/SVM Guidelines for the Diagnosis and Management of Patients with Thoracic Aortic Disease. Circulation. 2010; 121: [id number]. 7. Additional incidental findings, as detailed above.   Electronically Signed   By: Vinnie Langton M.D.   On: 10/08/2014 16:30   Ct Abdomen Pelvis W Contrast  10/08/2014   CLINICAL DATA:  81 year old male with history of trauma from a fall today in his yard complaining of left-sided flank pain and left posterior/lateral rib pain with inspiration. History of renal cancer and liver cancer.  EXAM: CT CHEST, ABDOMEN, AND PELVIS WITH CONTRAST  TECHNIQUE: Multidetector CT imaging of the chest, abdomen and pelvis was performed following the standard protocol during bolus administration of intravenous contrast.  CONTRAST:  159mL OMNIPAQUE IOHEXOL 300 MG/ML  SOLN  COMPARISON:  CT of the abdomen and pelvis 04/13/2007.  FINDINGS: CT CHEST FINDINGS  Mediastinum/Lymph Nodes: No abnormal high attenuation fluid within the mediastinum to suggest posttraumatic mediastinal hematoma. No evidence of posttraumatic aortic dissection/transection. Heart size is normal. There is no significant pericardial fluid, thickening or pericardial calcification. Severe calcifications and thickening of the aortic valve. There is atherosclerosis of the thoracic aorta, the great vessels of the mediastinum and the coronary arteries, including calcified atherosclerotic plaque in the left anterior descending,  left circumflex and right coronary arteries. In addition, there is ectasia of the ascending thoracic aorta (4.0 cm in diameter). Borderline enlarged right hilar lymph node measuring 9 mm (nonspecific). No other pathologically enlarged mediastinal or hilar lymph nodes are noted.  Esophagus is unremarkable in appearance. No axillary lymphadenopathy.  Lungs/Pleura: Multiple pulmonary nodules are noted throughout the lungs bilaterally, concerning for potential metastatic disease. The largest of these nodules is in the right lower lobe (image 42 of series 4) measuring 1 cm. The largest nodule in the left lung is in the left lower lobe measuring 9 mm (image 45 of series 4). No acute consolidative airspace disease. No pleural effusions. No pneumothorax. Mild dependent subsegmental atelectasis in the lower lobes of the lungs bilaterally.  Musculoskeletal/Soft Tissues: Acute nondisplaced fractures of the lateral aspects of the left fifth, sixth and seventh ribs are noted. There is a tiny loculated of gas in the left chest wall musculature (image 47 of series 4). There are no aggressive appearing lytic or blastic lesions noted in the visualized portions of the skeleton.  CT ABDOMEN AND PELVIS FINDINGS  Hepatobiliary: Postoperative changes of partial right hepatectomy. Capsular retraction and focal calcification in the anterior aspect of the left lobe of the liver, new compared to prior examinations, potentially treatment related. 12 mm ill-defined hypovascular lesion in the central aspect of the liver near the dome (image 51 of series 2), new compared to prior examinations. Status post cholecystectomy. No signs of acute traumatic injury to the liver.  Pancreas: Pancreatic duct is slightly prominent measuring up to 4 mm. No definite obstructing mass is identified in the head of the pancreas. No pancreatic or peripancreatic inflammatory changes. Numerous surgical clips are noted adjacent to the body of the pancreas.  Spleen:  Unremarkable. Specifically, no signs of acute traumatic injury to the spleen.  Adrenals/Urinary Tract: Status post left nephrectomy. No soft tissue mass in the left retroperitoneum to suggest local recurrence of disease. Left adrenal gland is not confidently identified may be surgically absent as well. Right adrenal gland is normal in appearance. Two low-attenuation lesions in the upper pole of the right kidney are compatible with simple cysts, largest of which measures 2.2 cm in diameter. No right-sided hydroureteronephrosis. Urinary bladder wall is mildly thickened and irregular, with multiple small bladder wall diverticulae.  Stomach/Bowel: The appearance of the stomach is normal. No pathologic dilatation of small bowel or colon. A few colonic diverticulae are noted, without surrounding inflammatory changes to suggest an acute diverticulitis at this time. Normal appendix.  Vascular/Lymphatic: Atherosclerosis throughout the abdominal and pelvic vasculature, without evidence of aneurysm or dissection. No lymphadenopathy noted in the abdomen or pelvis.  Reproductive: Prostate gland and seminal vesicles are unremarkable in appearance.  Other: Small bilateral inguinal hernias. The left inguinal hernia contains a small volume of fat. The right inguinal hernia contains a short segment of distal small bowel (presumably ileum), without evidence of incarceration or obstruction at this time. No high attenuation intraperitoneal or retroperitoneal fluid collections to suggest significant posttraumatic hemorrhage. No significant volume of ascites. No pneumoperitoneum.  Musculoskeletal: S-shaped scoliosis of the lumbar spine convex to the right superiorly and to the left inferiorly. Multilevel degenerative disc disease. No acute displaced fractures or aggressive appearing lytic or blastic lesions are noted in the visualized portions of the skeleton.  IMPRESSION: 1. Nondisplaced fractures of the lateral aspects of the left  fifth, sixth and seventh ribs. No associated pneumothorax. 2. No other signs of significant acute traumatic injury to the chest, abdomen or pelvis. 3. However, there are multiple pulmonary nodules in lungs bilaterally, highly concerning for metastatic disease. Correlation with PET-CT and/or biopsy in the near future is suggested if clinically appropriate. 4. Status post partial right hepatectomy. Probable treatment related  changes in the left lobe of the liver, where there is also a focal capsular retraction and calcification. 5. Status post left nephrectomy. 6. Atherosclerosis, including 3 vessel coronary artery disease. In addition, there is mild ectasia of the ascending thoracic aorta (4.0 cm in diameter). Recommend annual imaging followup by CTA or MRA. This recommendation follows 2010 ACCF/AHA/AATS/ACR/ASA/SCA/SCAI/SIR/STS/SVM Guidelines for the Diagnosis and Management of Patients with Thoracic Aortic Disease. Circulation. 2010; 121: [id number]. 7. Additional incidental findings, as detailed above.   Electronically Signed   By: Vinnie Langton M.D.   On: 10/08/2014 16:30    Review of Systems  Constitutional: Negative.   HENT: Negative.   Eyes: Negative.   Respiratory: Negative.   Cardiovascular: Positive for chest pain. Negative for palpitations, orthopnea, claudication, leg swelling and PND.  Gastrointestinal: Negative.   Genitourinary: Negative.   Musculoskeletal: Negative.   Skin: Negative.   Neurological: Negative.   Endo/Heme/Allergies: Negative.   Psychiatric/Behavioral: Negative.     Blood pressure 141/70, pulse 65, temperature 98.8 F (37.1 C), temperature source Oral, resp. rate 14, height $RemoveBe'5\' 9"'[id number]$  (1.753 m), weight 68.04 kg (150 lb), SpO2 94 %. Physical Exam  Constitutional: He is oriented to person, place, and time. He appears well-developed and well-nourished.  HENT:  Head: Normocephalic and atraumatic.  Mouth/Throat: No oropharyngeal exudate.  Eyes: Conjunctivae and EOM are  normal. Pupils are equal, round, and reactive to light. No scleral icterus.  Neck: Normal range of motion. Neck supple. No JVD present. No tracheal deviation present. No thyromegaly present.  Cardiovascular: Normal rate and regular rhythm.  Exam reveals no gallop and no friction rub.   No murmur heard. Respiratory: Effort normal and breath sounds normal. No respiratory distress. He has no wheezes. He has no rales.  GI: Soft. Bowel sounds are normal. He exhibits no distension. There is no tenderness. There is no rebound and no guarding.  Musculoskeletal: Normal range of motion. He exhibits no edema or tenderness.  Lymphadenopathy:    He has no cervical adenopathy.  Neurological: He is alert and oriented to person, place, and time. He has normal reflexes. He displays normal reflexes. No cranial nerve deficit. He exhibits normal muscle tone. Coordination normal.  Skin: Skin is warm and dry. No rash noted. No erythema. No pallor.  3 scars on abdomen from prior surgery  Psychiatric: He has a normal mood and affect. His behavior is normal. Judgment and thought content normal.     Assessment/Plan Fall, Chest pain Tele Check trop i q6h x2 Pain control with ibuprofen $RemoveBefore'600mg'[id number]$  po tid prn, tramadol $RemoveBefor'50mg'[id number]$  po q6h prn, dilaudid $RemoveBefor'1mg'[id number]$  iv q4h prn Colace $RemoveBe'100mg'[id number]$  poq day Surgery consult pending due to multiple rib fractures, appreciate input,  Incentive spirometry  Multiple lung nodules, Pt declined PET scan   DVt prophylaxis, scd, lovenox  Kamya Watling 10/08/2014, 8:24 PM

## 2014-10-08 NOTE — Consult Note (Signed)
Reason for Consult:left rib fx Referring Physician: Ralene Bathe MD  Corey Davila is an 81 y.o. male.  HPI: Pt transferred from [place] after ground level fall pruning trees.   Complains of left chest pain.  CXR and CT chest shows left rib fractures non displaced 3-5.  No pulmonary distress.  Hx of liver resection and syncopal episodes in the past.  Liver resection at [facility] 2008 AND 2012.  Feels comfortable now. Did not loose consciousness and only lost his balance.   Past Medical History  Diagnosis Date  . Liver cancer     partial liver resection 06/18/2007; partial liver resection 08/29/2010, not renal  . History of kidney cancer 06/19/1979    L renal taken out   . Mixed hyperlipidemia   . Cough   . Arthritis   . Wears hearing aid     both ears    Past Surgical History  Procedure Laterality Date  . Nephrectomy  1980    left, [place] -cancer  . Partial hepatectomy  06/18/07    [facility]  R lobe-cancer  . Parttial hepatectomy  08/29/2010    [facility]  L lobe  . Colonoscopy    . Carpal tunnel release Left 09/10/2013    Procedure: LEFT CARPAL TUNNEL RELEASE;  Surgeon: Cammie Sickle., MD;  Location: [facility];  Service: Orthopedics;  Laterality: Left;    Family History  Problem Relation Age of Onset  . Stroke Father     Social History:  reports that he has never smoked. He has never used smokeless tobacco. He reports that he does not drink alcohol or use illicit drugs.  Allergies:  Allergies  Allergen Reactions  . Tessalon [Benzonatate]   . Tussicaps [Hydrocod Polst-Cpm Polst Er]   . Bee Venom   . Dilantin [Phenytoin]   . Mevacor [Lovastatin]   . Niacin And Related     Rash   . Septra Ds [Sulfamethoxazole-Trimethoprim]     Medications: I have reviewed the patient's current medications.  Results for orders placed or performed during the hospital encounter of 10/08/14 (from the past 48 hour(s))  CBC with Differential     Status: Abnormal   Collection  Time: 10/08/14  2:35 PM  Result Value Ref Range   WBC 9.4 4.0 - 10.5 K/uL   RBC 4.31 4.22 - 5.81 MIL/uL   Hemoglobin 14.0 13.0 - 17.0 g/dL   HCT 42.3 39.0 - 52.0 %   MCV 98.1 78.0 - 100.0 fL   MCH 32.5 26.0 - 34.0 pg   MCHC 33.1 30.0 - 36.0 g/dL   RDW 13.1 11.5 - 15.5 %   Platelets 188 150 - 400 K/uL   Neutrophils Relative % 80 (H) 43 - 77 %   Lymphocytes Relative 12 12 - 46 %   Monocytes Relative 5 3 - 12 %   Eosinophils Relative 0 0 - 5 %   Basophils Relative 0 0 - 1 %   Band Neutrophils 2 0 - 10 %   Metamyelocytes Relative 1 %   Neutro Abs 7.8 (H) 1.7 - 7.7 K/uL   Lymphs Abs 1.1 0.7 - 4.0 K/uL   Monocytes Absolute 0.5 0.1 - 1.0 K/uL   Eosinophils Absolute 0.0 0.0 - 0.7 K/uL   Basophils Absolute 0.0 0.0 - 0.1 K/uL   RBC Morphology MIXED RBC POPULATION    WBC Morphology VACUOLATED NEUTROPHILS   Basic metabolic panel     Status: Abnormal   Collection Time: 10/08/14  2:35 PM  Result Value Ref Range   Sodium 136 135 - 145 mmol/L   Potassium 4.2 3.5 - 5.1 mmol/L   Chloride 101 96 - 112 mmol/L   CO2 26 19 - 32 mmol/L   Glucose, Bld 112 (H) 70 - 99 mg/dL   BUN 25 (H) 6 - 23 mg/dL   Creatinine, Ser 1.02 0.50 - 1.35 mg/dL   Calcium 9.4 8.4 - 10.5 mg/dL   GFR calc non Af Amer 63 (L) >90 mL/min   GFR calc Af Amer 74 (L) >90 mL/min    Comment: (NOTE) The eGFR has been calculated using the CKD EPI equation. This calculation has not been validated in all clinical situations. eGFR's persistently <90 mL/min signify possible Chronic Kidney Disease.    Anion gap 9 5 - 15    Dg Chest 2 View  10/08/2014   CLINICAL DATA:  Pain following fall  EXAM: CHEST  2 VIEW  COMPARISON:  December 31, 2012  FINDINGS: There is no edema or consolidation. On the lateral view, there is a 1.1 x 1.0 cm nodular opacity overlying a lower thoracic vertebral body. This finding was not present on prior chest radiographic examination.  Heart size and pulmonary vascularity are within normal limits. No adenopathy.   There are acute appearing fractures of the lateral left fifth, sixth, and seventh ribs. There is no demonstrable pneumothorax. There is a skin fold on the left, however.  IMPRESSION: Nodular opacity posterior basilar region seen only on the lateral view. This finding was not present on prior study. Advise noncontrast enhanced chest CT to further assess.  No edema or consolidation.  Nondisplaced acute appearing fractures of the lateral left fifth, sixth, and seventh ribs are noted. No apparent pneumothorax.   Electronically Signed   By: Lowella Grip III M.D.   On: 10/08/2014 13:41   Dg Thoracic Spine 2 View  10/08/2014   CLINICAL DATA:  Fall.  Upper left chest pain.  EXAM: THORACIC SPINE - 2 VIEW  COMPARISON:  Chest radiograph 10/08/2014  FINDINGS: Surgical clips in the upper abdomen. There is scoliosis in the lumbar junction. Thoracic vertebral body heights are maintained. No evidence for a compression deformity in the thoracic spine. Alignment at the cervical-thoracic junction is within normal limits.  IMPRESSION: No acute bone abnormality in the thoracic spine.  Scoliosis in the lumbar spine.   Electronically Signed   By: Markus Daft M.D.   On: 10/08/2014 13:43   Ct Chest W Contrast  10/08/2014   CLINICAL DATA:  81 year old male with history of trauma from a fall today in his yard complaining of left-sided flank pain and left posterior/lateral rib pain with inspiration. History of renal cancer and liver cancer.  EXAM: CT CHEST, ABDOMEN, AND PELVIS WITH CONTRAST  TECHNIQUE: Multidetector CT imaging of the chest, abdomen and pelvis was performed following the standard protocol during bolus administration of intravenous contrast.  CONTRAST:  145m OMNIPAQUE IOHEXOL 300 MG/ML  SOLN  COMPARISON:  CT of the abdomen and pelvis 04/13/2007.  FINDINGS: CT CHEST FINDINGS  Mediastinum/Lymph Nodes: No abnormal high attenuation fluid within the mediastinum to suggest posttraumatic mediastinal hematoma. No evidence of  posttraumatic aortic dissection/transection. Heart size is normal. There is no significant pericardial fluid, thickening or pericardial calcification. Severe calcifications and thickening of the aortic valve. There is atherosclerosis of the thoracic aorta, the great vessels of the mediastinum and the coronary arteries, including calcified atherosclerotic plaque in the left anterior descending, left circumflex and right coronary  arteries. In addition, there is ectasia of the ascending thoracic aorta (4.0 cm in diameter). Borderline enlarged right hilar lymph node measuring 9 mm (nonspecific). No other pathologically enlarged mediastinal or hilar lymph nodes are noted. Esophagus is unremarkable in appearance. No axillary lymphadenopathy.  Lungs/Pleura: Multiple pulmonary nodules are noted throughout the lungs bilaterally, concerning for potential metastatic disease. The largest of these nodules is in the right lower lobe (image 42 of series 4) measuring 1 cm. The largest nodule in the left lung is in the left lower lobe measuring 9 mm (image 45 of series 4). No acute consolidative airspace disease. No pleural effusions. No pneumothorax. Mild dependent subsegmental atelectasis in the lower lobes of the lungs bilaterally.  Musculoskeletal/Soft Tissues: Acute nondisplaced fractures of the lateral aspects of the left fifth, sixth and seventh ribs are noted. There is a tiny loculated of gas in the left chest wall musculature (image 47 of series 4). There are no aggressive appearing lytic or blastic lesions noted in the visualized portions of the skeleton.  CT ABDOMEN AND PELVIS FINDINGS  Hepatobiliary: Postoperative changes of partial right hepatectomy. Capsular retraction and focal calcification in the anterior aspect of the left lobe of the liver, new compared to prior examinations, potentially treatment related. 12 mm ill-defined hypovascular lesion in the central aspect of the liver near the dome (image 51 of series  2), new compared to prior examinations. Status post cholecystectomy. No signs of acute traumatic injury to the liver.  Pancreas: Pancreatic duct is slightly prominent measuring up to 4 mm. No definite obstructing mass is identified in the head of the pancreas. No pancreatic or peripancreatic inflammatory changes. Numerous surgical clips are noted adjacent to the body of the pancreas.  Spleen: Unremarkable. Specifically, no signs of acute traumatic injury to the spleen.  Adrenals/Urinary Tract: Status post left nephrectomy. No soft tissue mass in the left retroperitoneum to suggest local recurrence of disease. Left adrenal gland is not confidently identified may be surgically absent as well. Right adrenal gland is normal in appearance. Two low-attenuation lesions in the upper pole of the right kidney are compatible with simple cysts, largest of which measures 2.2 cm in diameter. No right-sided hydroureteronephrosis. Urinary bladder wall is mildly thickened and irregular, with multiple small bladder wall diverticulae.  Stomach/Bowel: The appearance of the stomach is normal. No pathologic dilatation of small bowel or colon. A few colonic diverticulae are noted, without surrounding inflammatory changes to suggest an acute diverticulitis at this time. Normal appendix.  Vascular/Lymphatic: Atherosclerosis throughout the abdominal and pelvic vasculature, without evidence of aneurysm or dissection. No lymphadenopathy noted in the abdomen or pelvis.  Reproductive: Prostate gland and seminal vesicles are unremarkable in appearance.  Other: Small bilateral inguinal hernias. The left inguinal hernia contains a small volume of fat. The right inguinal hernia contains a short segment of distal small bowel (presumably ileum), without evidence of incarceration or obstruction at this time. No high attenuation intraperitoneal or retroperitoneal fluid collections to suggest significant posttraumatic hemorrhage. No significant volume of  ascites. No pneumoperitoneum.  Musculoskeletal: S-shaped scoliosis of the lumbar spine convex to the right superiorly and to the left inferiorly. Multilevel degenerative disc disease. No acute displaced fractures or aggressive appearing lytic or blastic lesions are noted in the visualized portions of the skeleton.  IMPRESSION: 1. Nondisplaced fractures of the lateral aspects of the left fifth, sixth and seventh ribs. No associated pneumothorax. 2. No other signs of significant acute traumatic injury to the chest, abdomen or pelvis. 3. However,  there are multiple pulmonary nodules in lungs bilaterally, highly concerning for metastatic disease. Correlation with PET-CT and/or biopsy in the near future is suggested if clinically appropriate. 4. Status post partial right hepatectomy. Probable treatment related changes in the left lobe of the liver, where there is also a focal capsular retraction and calcification. 5. Status post left nephrectomy. 6. Atherosclerosis, including 3 vessel coronary artery disease. In addition, there is mild ectasia of the ascending thoracic aorta (4.0 cm in diameter). Recommend annual imaging followup by CTA or MRA. This recommendation follows 2010 ACCF/AHA/AATS/ACR/ASA/SCA/SCAI/SIR/STS/SVM Guidelines for the Diagnosis and Management of Patients with Thoracic Aortic Disease. Circulation. 2010; 121: C588-F027. 7. Additional incidental findings, as detailed above.   Electronically Signed   By: Vinnie Langton M.D.   On: 10/08/2014 16:30   Ct Abdomen Pelvis W Contrast  10/08/2014   CLINICAL DATA:  81 year old male with history of trauma from a fall today in his yard complaining of left-sided flank pain and left posterior/lateral rib pain with inspiration. History of renal cancer and liver cancer.  EXAM: CT CHEST, ABDOMEN, AND PELVIS WITH CONTRAST  TECHNIQUE: Multidetector CT imaging of the chest, abdomen and pelvis was performed following the standard protocol during bolus administration of  intravenous contrast.  CONTRAST:  137m OMNIPAQUE IOHEXOL 300 MG/ML  SOLN  COMPARISON:  CT of the abdomen and pelvis 04/13/2007.  FINDINGS: CT CHEST FINDINGS  Mediastinum/Lymph Nodes: No abnormal high attenuation fluid within the mediastinum to suggest posttraumatic mediastinal hematoma. No evidence of posttraumatic aortic dissection/transection. Heart size is normal. There is no significant pericardial fluid, thickening or pericardial calcification. Severe calcifications and thickening of the aortic valve. There is atherosclerosis of the thoracic aorta, the great vessels of the mediastinum and the coronary arteries, including calcified atherosclerotic plaque in the left anterior descending, left circumflex and right coronary arteries. In addition, there is ectasia of the ascending thoracic aorta (4.0 cm in diameter). Borderline enlarged right hilar lymph node measuring 9 mm (nonspecific). No other pathologically enlarged mediastinal or hilar lymph nodes are noted. Esophagus is unremarkable in appearance. No axillary lymphadenopathy.  Lungs/Pleura: Multiple pulmonary nodules are noted throughout the lungs bilaterally, concerning for potential metastatic disease. The largest of these nodules is in the right lower lobe (image 42 of series 4) measuring 1 cm. The largest nodule in the left lung is in the left lower lobe measuring 9 mm (image 45 of series 4). No acute consolidative airspace disease. No pleural effusions. No pneumothorax. Mild dependent subsegmental atelectasis in the lower lobes of the lungs bilaterally.  Musculoskeletal/Soft Tissues: Acute nondisplaced fractures of the lateral aspects of the left fifth, sixth and seventh ribs are noted. There is a tiny loculated of gas in the left chest wall musculature (image 47 of series 4). There are no aggressive appearing lytic or blastic lesions noted in the visualized portions of the skeleton.  CT ABDOMEN AND PELVIS FINDINGS  Hepatobiliary: Postoperative changes  of partial right hepatectomy. Capsular retraction and focal calcification in the anterior aspect of the left lobe of the liver, new compared to prior examinations, potentially treatment related. 12 mm ill-defined hypovascular lesion in the central aspect of the liver near the dome (image 51 of series 2), new compared to prior examinations. Status post cholecystectomy. No signs of acute traumatic injury to the liver.  Pancreas: Pancreatic duct is slightly prominent measuring up to 4 mm. No definite obstructing mass is identified in the head of the pancreas. No pancreatic or peripancreatic inflammatory changes. Numerous surgical clips are  noted adjacent to the body of the pancreas.  Spleen: Unremarkable. Specifically, no signs of acute traumatic injury to the spleen.  Adrenals/Urinary Tract: Status post left nephrectomy. No soft tissue mass in the left retroperitoneum to suggest local recurrence of disease. Left adrenal gland is not confidently identified may be surgically absent as well. Right adrenal gland is normal in appearance. Two low-attenuation lesions in the upper pole of the right kidney are compatible with simple cysts, largest of which measures 2.2 cm in diameter. No right-sided hydroureteronephrosis. Urinary bladder wall is mildly thickened and irregular, with multiple small bladder wall diverticulae.  Stomach/Bowel: The appearance of the stomach is normal. No pathologic dilatation of small bowel or colon. A few colonic diverticulae are noted, without surrounding inflammatory changes to suggest an acute diverticulitis at this time. Normal appendix.  Vascular/Lymphatic: Atherosclerosis throughout the abdominal and pelvic vasculature, without evidence of aneurysm or dissection. No lymphadenopathy noted in the abdomen or pelvis.  Reproductive: Prostate gland and seminal vesicles are unremarkable in appearance.  Other: Small bilateral inguinal hernias. The left inguinal hernia contains a small volume of fat.  The right inguinal hernia contains a short segment of distal small bowel (presumably ileum), without evidence of incarceration or obstruction at this time. No high attenuation intraperitoneal or retroperitoneal fluid collections to suggest significant posttraumatic hemorrhage. No significant volume of ascites. No pneumoperitoneum.  Musculoskeletal: S-shaped scoliosis of the lumbar spine convex to the right superiorly and to the left inferiorly. Multilevel degenerative disc disease. No acute displaced fractures or aggressive appearing lytic or blastic lesions are noted in the visualized portions of the skeleton.  IMPRESSION: 1. Nondisplaced fractures of the lateral aspects of the left fifth, sixth and seventh ribs. No associated pneumothorax. 2. No other signs of significant acute traumatic injury to the chest, abdomen or pelvis. 3. However, there are multiple pulmonary nodules in lungs bilaterally, highly concerning for metastatic disease. Correlation with PET-CT and/or biopsy in the near future is suggested if clinically appropriate. 4. Status post partial right hepatectomy. Probable treatment related changes in the left lobe of the liver, where there is also a focal capsular retraction and calcification. 5. Status post left nephrectomy. 6. Atherosclerosis, including 3 vessel coronary artery disease. In addition, there is mild ectasia of the ascending thoracic aorta (4.0 cm in diameter). Recommend annual imaging followup by CTA or MRA. This recommendation follows 2010 ACCF/AHA/AATS/ACR/ASA/SCA/SCAI/SIR/STS/SVM Guidelines for the Diagnosis and Management of Patients with Thoracic Aortic Disease. Circulation. 2010; 121: [id number]. 7. Additional incidental findings, as detailed above.   Electronically Signed   By: Vinnie Langton M.D.   On: 10/08/2014 16:30    Review of Systems  Constitutional: Negative for fever and chills.  HENT: Negative.   Eyes: Negative.   Respiratory: Negative for cough and shortness of  breath.   Cardiovascular: Positive for chest pain.  Gastrointestinal: Negative for abdominal pain.  Genitourinary: Negative.   Musculoskeletal: Positive for falls.  Skin: Negative.   Neurological: Negative.   Psychiatric/Behavioral: Negative.    Blood pressure 141/70, pulse 65, temperature 98.8 F (37.1 C), temperature source Oral, resp. rate 14, height _0  (1.753 m), weight 68.04 kg (150 lb), SpO2 94 %. Physical Exam  Constitutional: He is oriented to person, place, and time. He appears well-developed and well-nourished.  HENT:  Head: Normocephalic and atraumatic.  Eyes: Pupils are equal, round, and reactive to light. No scleral icterus.  Neck: Normal range of motion. Neck supple.  Cardiovascular: Normal rate.   Respiratory: Effort normal and breath sounds  normal. He exhibits tenderness.  Musculoskeletal: Normal range of motion.  Neurological: He is alert and oriented to person, place, and time.  Skin: Skin is warm and dry.    Assessment/Plan: Left rib fractures 3 - 5 nondisplaced from ground level fall Pulmonary nodules  Per primary care Liver mass sees Dr Crisoforo Oxford in may.  May want to move this up.  PET scan may be helpful but patient refused.  Hx of liver resection times 2 at [facility] Dr Crisoforo Oxford Looks comfortable Pulmonary toilet  Pain control Can go home in am Trauma available as needed.   Shamya Macfadden A. 10/08/2014, 9:11 PM

## 2014-10-08 NOTE — ED Provider Notes (Signed)
CSN: [id number]     Arrival date & time 10/08/14  1219 History   First MD Initiated Contact with Patient 10/08/14 1226     Chief Complaint  Patient presents with  . Fall     (Consider location/radiation/quality/duration/timing/severity/associated sxs/prior Treatment) HPI Comments: 81 year old male with history of kidney cancer, liver cancer with liver resection, pneumonia history presents with bilateral back and flank pain since he fell prior to arrival. Patient was pruning trees from a standing position and was leaning back to prune and briefly lost his balance falling back. No blood thinners, no loss of consciousness, patient in no symptoms prior to falling and feels at baseline except for bilateral flank and rib pain. Pain worse with a deep breath and palpation. No abdominal pain or vomiting since  The history is provided by the patient and a relative.    Past Medical History  Diagnosis Date  . Liver cancer     partial liver resection 06/18/2007; partial liver resection 08/29/2010, not renal  . History of kidney cancer 06/19/1979    L renal taken out   . Mixed hyperlipidemia   . Cough   . Arthritis   . Wears hearing aid     both ears   Past Surgical History  Procedure Laterality Date  . Nephrectomy  1980    left, [place] -cancer  . Partial hepatectomy  06/18/07    [facility]  R lobe-cancer  . Parttial hepatectomy  08/29/2010    [facility]  L lobe  . Colonoscopy    . Carpal tunnel release Left 09/10/2013    Procedure: LEFT CARPAL TUNNEL RELEASE;  Surgeon: Cammie Sickle., MD;  Location: [place];  Service: Orthopedics;  Laterality: Left;   Family History  Problem Relation Age of Onset  . Stroke Father    History  Substance Use Topics  . Smoking status: Never Smoker   . Smokeless tobacco: Never Used  . Alcohol Use: No    Review of Systems  Constitutional: Negative for fever and chills.  HENT: Negative for congestion.   Eyes: Negative for visual  disturbance.  Respiratory: Negative for shortness of breath.   Cardiovascular: Negative for chest pain.  Gastrointestinal: Negative for vomiting and abdominal pain.  Genitourinary: Positive for flank pain. Negative for dysuria.  Musculoskeletal: Negative for back pain, neck pain and neck stiffness.  Skin: Negative for rash.  Neurological: Negative for light-headedness and headaches.      Allergies  Tessalon; Tussicaps; Bee venom; Dilantin; Mevacor; Niacin and related; and Septra ds  Home Medications   Prior to Admission medications   Medication Sig Start Date End Date Taking? Authorizing Provider  Calcium Citrate 200 MG TABS Take 1 tablet by mouth daily. 08/21/10  Yes Historical Provider, MD  Calcium-Vit D3-Phytosterols [phone] MG-UNIT-MG TABS Take 1 tablet by mouth daily. 04/21/07  Yes Historical Provider, MD  docusate sodium (COLACE) 100 MG capsule Take 100 mg by mouth daily. 08/21/10  Yes Historical Provider, MD  Glucosamine-Chondroitin 250-200 MG TABS Take 1 tablet by mouth daily. Regular strength tabs. 04/21/07  Yes Historical Provider, MD  LYCOPENE PO Take 1 tablet by mouth daily. 08/21/10  Yes Historical Provider, MD  methylPREDNIsolone (MEDROL DOSPACK) 4 MG tablet follow package directions 09/29/14  Yes Historical Provider, MD  predniSONE (DELTASONE) 5 MG tablet Take 5 mg by mouth. 04/01/14  Yes Historical Provider, MD  aspirin 81 MG tablet Take 81 mg by mouth daily.    Historical Provider, MD  CRESTOR 20  MG tablet Take 1 tablet by mouth every other day.    Historical Provider, MD  desmopressin (DDAVP) 0.2 MG tablet Take 0.5 tablets by mouth at bedtime.    Historical Provider, MD  finasteride (PROSCAR) 5 MG tablet Take 1 tablet by mouth daily.    Historical Provider, MD  Milk Thistle 250 MG CAPS Take 1 capsule by mouth daily.    Historical Provider, MD  Misc Natural Products (OSTEO BI-FLEX JOINT SHIELD PO) Take 1 tablet by mouth 2 (two) times daily.    Historical Provider, MD   Multiple Minerals-Vitamins (CALCIUM CITRATE PLUS/MAGNESIUM PO) Take 1 tablet by mouth daily.    Historical Provider, MD  Multiple Vitamin (MULTIVITAMIN) tablet Take 1 tablet by mouth daily.    Historical Provider, MD  traMADol (ULTRAM) 50 MG tablet Take 1 tablet (50 mg total) by mouth every 6 (six) hours as needed. 10/09/14   Shanker Kristeen Mans, MD  vitamin B-12 (CYANOCOBALAMIN) 250 MCG tablet Take 250 mcg by mouth daily.    Historical Provider, MD   BP 144/66 mmHg  Pulse 57  Temp(Src) 98.8 F (37.1 C) (Oral)  Resp 16  Ht 5\' 9"  (1.753 m)  Wt 150 lb (68.04 kg)  BMI 22.14 kg/m2  SpO2 95% Physical Exam  Constitutional: He is oriented to person, place, and time. He appears well-developed and well-nourished.  HENT:  Head: Normocephalic and atraumatic.  Eyes: Conjunctivae are normal. Right eye exhibits no discharge. Left eye exhibits no discharge.  Neck: Normal range of motion. Neck supple. No tracheal deviation present.  Cardiovascular: Normal rate and regular rhythm.   Pulmonary/Chest: Effort normal and breath sounds normal.  Abdominal: Soft. He exhibits no distension. There is no tenderness. There is no guarding.  Musculoskeletal: He exhibits tenderness. He exhibits no edema.  Patient tender bilateral paraspinal lower ribs posterior and worse left flank no step-off appreciated  Full range of motion of hips head and neck and knees without tenderness or effusion. No midline cervical lumbar thoracic tenderness.  Neurological: He is alert and oriented to person, place, and time. No cranial nerve deficit.  Skin: Skin is warm. No rash noted.  Patient has 5 cm superficial skin tear with mild bleeding controlled no bony tenderness surrounding  Psychiatric: He has a normal mood and affect.  Nursing note and vitals reviewed.   ED Course  Procedures (including critical care time) Labs Review Labs Reviewed  CBC WITH DIFFERENTIAL/PLATELET - Abnormal; Notable for the following:    Neutrophils  Relative % 80 (*)    Neutro Abs 7.8 (*)    All other components within normal limits  BASIC METABOLIC PANEL - Abnormal; Notable for the following:    Glucose, Bld 112 (*)    BUN 25 (*)    GFR calc non Af Amer 63 (*)    GFR calc Af Amer 74 (*)    All other components within normal limits  TROPONIN I  TROPONIN I    Imaging Review No results found. Dg Chest 2 View  10/08/2014   CLINICAL DATA:  Pain following fall  EXAM: CHEST  2 VIEW  COMPARISON:  December 31, 2012  FINDINGS: There is no edema or consolidation. On the lateral view, there is a 1.1 x 1.0 cm nodular opacity overlying a lower thoracic vertebral body. This finding was not present on prior chest radiographic examination.  Heart size and pulmonary vascularity are within normal limits. No adenopathy.  There are acute appearing fractures of the lateral left fifth, sixth, and  seventh ribs. There is no demonstrable pneumothorax. There is a skin fold on the left, however.  IMPRESSION: Nodular opacity posterior basilar region seen only on the lateral view. This finding was not present on prior study. Advise noncontrast enhanced chest CT to further assess.  No edema or consolidation.  Nondisplaced acute appearing fractures of the lateral left fifth, sixth, and seventh ribs are noted. No apparent pneumothorax.   Electronically Signed   By: Lowella Grip III M.D.   On: 10/08/2014 13:41   Dg Thoracic Spine 2 View  10/08/2014   CLINICAL DATA:  Fall.  Upper left chest pain.  EXAM: THORACIC SPINE - 2 VIEW  COMPARISON:  Chest radiograph 10/08/2014  FINDINGS: Surgical clips in the upper abdomen. There is scoliosis in the lumbar junction. Thoracic vertebral body heights are maintained. No evidence for a compression deformity in the thoracic spine. Alignment at the cervical-thoracic junction is within normal limits.  IMPRESSION: No acute bone abnormality in the thoracic spine.  Scoliosis in the lumbar spine.   Electronically Signed   By: Markus Daft M.D.    On: 10/08/2014 13:43   Ct Chest W Contrast  10/08/2014   CLINICAL DATA:  81 year old male with history of trauma from a fall today in his yard complaining of left-sided flank pain and left posterior/lateral rib pain with inspiration. History of renal cancer and liver cancer.  EXAM: CT CHEST, ABDOMEN, AND PELVIS WITH CONTRAST  TECHNIQUE: Multidetector CT imaging of the chest, abdomen and pelvis was performed following the standard protocol during bolus administration of intravenous contrast.  CONTRAST:  17mL OMNIPAQUE IOHEXOL 300 MG/ML  SOLN  COMPARISON:  CT of the abdomen and pelvis 04/13/2007.  FINDINGS: CT CHEST FINDINGS  Mediastinum/Lymph Nodes: No abnormal high attenuation fluid within the mediastinum to suggest posttraumatic mediastinal hematoma. No evidence of posttraumatic aortic dissection/transection. Heart size is normal. There is no significant pericardial fluid, thickening or pericardial calcification. Severe calcifications and thickening of the aortic valve. There is atherosclerosis of the thoracic aorta, the great vessels of the mediastinum and the coronary arteries, including calcified atherosclerotic plaque in the left anterior descending, left circumflex and right coronary arteries. In addition, there is ectasia of the ascending thoracic aorta (4.0 cm in diameter). Borderline enlarged right hilar lymph node measuring 9 mm (nonspecific). No other pathologically enlarged mediastinal or hilar lymph nodes are noted. Esophagus is unremarkable in appearance. No axillary lymphadenopathy.  Lungs/Pleura: Multiple pulmonary nodules are noted throughout the lungs bilaterally, concerning for potential metastatic disease. The largest of these nodules is in the right lower lobe (image 42 of series 4) measuring 1 cm. The largest nodule in the left lung is in the left lower lobe measuring 9 mm (image 45 of series 4). No acute consolidative airspace disease. No pleural effusions. No pneumothorax. Mild dependent  subsegmental atelectasis in the lower lobes of the lungs bilaterally.  Musculoskeletal/Soft Tissues: Acute nondisplaced fractures of the lateral aspects of the left fifth, sixth and seventh ribs are noted. There is a tiny loculated of gas in the left chest wall musculature (image 47 of series 4). There are no aggressive appearing lytic or blastic lesions noted in the visualized portions of the skeleton.  CT ABDOMEN AND PELVIS FINDINGS  Hepatobiliary: Postoperative changes of partial right hepatectomy. Capsular retraction and focal calcification in the anterior aspect of the left lobe of the liver, new compared to prior examinations, potentially treatment related. 12 mm ill-defined hypovascular lesion in the central aspect of the liver near the dome (  image 51 of series 2), new compared to prior examinations. Status post cholecystectomy. No signs of acute traumatic injury to the liver.  Pancreas: Pancreatic duct is slightly prominent measuring up to 4 mm. No definite obstructing mass is identified in the head of the pancreas. No pancreatic or peripancreatic inflammatory changes. Numerous surgical clips are noted adjacent to the body of the pancreas.  Spleen: Unremarkable. Specifically, no signs of acute traumatic injury to the spleen.  Adrenals/Urinary Tract: Status post left nephrectomy. No soft tissue mass in the left retroperitoneum to suggest local recurrence of disease. Left adrenal gland is not confidently identified may be surgically absent as well. Right adrenal gland is normal in appearance. Two low-attenuation lesions in the upper pole of the right kidney are compatible with simple cysts, largest of which measures 2.2 cm in diameter. No right-sided hydroureteronephrosis. Urinary bladder wall is mildly thickened and irregular, with multiple small bladder wall diverticulae.  Stomach/Bowel: The appearance of the stomach is normal. No pathologic dilatation of small bowel or colon. A few colonic diverticulae are  noted, without surrounding inflammatory changes to suggest an acute diverticulitis at this time. Normal appendix.  Vascular/Lymphatic: Atherosclerosis throughout the abdominal and pelvic vasculature, without evidence of aneurysm or dissection. No lymphadenopathy noted in the abdomen or pelvis.  Reproductive: Prostate gland and seminal vesicles are unremarkable in appearance.  Other: Small bilateral inguinal hernias. The left inguinal hernia contains a small volume of fat. The right inguinal hernia contains a short segment of distal small bowel (presumably ileum), without evidence of incarceration or obstruction at this time. No high attenuation intraperitoneal or retroperitoneal fluid collections to suggest significant posttraumatic hemorrhage. No significant volume of ascites. No pneumoperitoneum.  Musculoskeletal: S-shaped scoliosis of the lumbar spine convex to the right superiorly and to the left inferiorly. Multilevel degenerative disc disease. No acute displaced fractures or aggressive appearing lytic or blastic lesions are noted in the visualized portions of the skeleton.  IMPRESSION: 1. Nondisplaced fractures of the lateral aspects of the left fifth, sixth and seventh ribs. No associated pneumothorax. 2. No other signs of significant acute traumatic injury to the chest, abdomen or pelvis. 3. However, there are multiple pulmonary nodules in lungs bilaterally, highly concerning for metastatic disease. Correlation with PET-CT and/or biopsy in the near future is suggested if clinically appropriate. 4. Status post partial right hepatectomy. Probable treatment related changes in the left lobe of the liver, where there is also a focal capsular retraction and calcification. 5. Status post left nephrectomy. 6. Atherosclerosis, including 3 vessel coronary artery disease. In addition, there is mild ectasia of the ascending thoracic aorta (4.0 cm in diameter). Recommend annual imaging followup by CTA or MRA. This  recommendation follows 2010 ACCF/AHA/AATS/ACR/ASA/SCA/SCAI/SIR/STS/SVM Guidelines for the Diagnosis and Management of Patients with Thoracic Aortic Disease. Circulation. 2010; 121: N829-F621. 7. Additional incidental findings, as detailed above.   Electronically Signed   By: Vinnie Langton M.D.   On: 10/08/2014 16:30   Ct Abdomen Pelvis W Contrast  10/08/2014   CLINICAL DATA:  81 year old male with history of trauma from a fall today in his yard complaining of left-sided flank pain and left posterior/lateral rib pain with inspiration. History of renal cancer and liver cancer.  EXAM: CT CHEST, ABDOMEN, AND PELVIS WITH CONTRAST  TECHNIQUE: Multidetector CT imaging of the chest, abdomen and pelvis was performed following the standard protocol during bolus administration of intravenous contrast.  CONTRAST:  162mL OMNIPAQUE IOHEXOL 300 MG/ML  SOLN  COMPARISON:  CT of the abdomen and  pelvis 04/13/2007.  FINDINGS: CT CHEST FINDINGS  Mediastinum/Lymph Nodes: No abnormal high attenuation fluid within the mediastinum to suggest posttraumatic mediastinal hematoma. No evidence of posttraumatic aortic dissection/transection. Heart size is normal. There is no significant pericardial fluid, thickening or pericardial calcification. Severe calcifications and thickening of the aortic valve. There is atherosclerosis of the thoracic aorta, the great vessels of the mediastinum and the coronary arteries, including calcified atherosclerotic plaque in the left anterior descending, left circumflex and right coronary arteries. In addition, there is ectasia of the ascending thoracic aorta (4.0 cm in diameter). Borderline enlarged right hilar lymph node measuring 9 mm (nonspecific). No other pathologically enlarged mediastinal or hilar lymph nodes are noted. Esophagus is unremarkable in appearance. No axillary lymphadenopathy.  Lungs/Pleura: Multiple pulmonary nodules are noted throughout the lungs bilaterally, concerning for potential  metastatic disease. The largest of these nodules is in the right lower lobe (image 42 of series 4) measuring 1 cm. The largest nodule in the left lung is in the left lower lobe measuring 9 mm (image 45 of series 4). No acute consolidative airspace disease. No pleural effusions. No pneumothorax. Mild dependent subsegmental atelectasis in the lower lobes of the lungs bilaterally.  Musculoskeletal/Soft Tissues: Acute nondisplaced fractures of the lateral aspects of the left fifth, sixth and seventh ribs are noted. There is a tiny loculated of gas in the left chest wall musculature (image 47 of series 4). There are no aggressive appearing lytic or blastic lesions noted in the visualized portions of the skeleton.  CT ABDOMEN AND PELVIS FINDINGS  Hepatobiliary: Postoperative changes of partial right hepatectomy. Capsular retraction and focal calcification in the anterior aspect of the left lobe of the liver, new compared to prior examinations, potentially treatment related. 12 mm ill-defined hypovascular lesion in the central aspect of the liver near the dome (image 51 of series 2), new compared to prior examinations. Status post cholecystectomy. No signs of acute traumatic injury to the liver.  Pancreas: Pancreatic duct is slightly prominent measuring up to 4 mm. No definite obstructing mass is identified in the head of the pancreas. No pancreatic or peripancreatic inflammatory changes. Numerous surgical clips are noted adjacent to the body of the pancreas.  Spleen: Unremarkable. Specifically, no signs of acute traumatic injury to the spleen.  Adrenals/Urinary Tract: Status post left nephrectomy. No soft tissue mass in the left retroperitoneum to suggest local recurrence of disease. Left adrenal gland is not confidently identified may be surgically absent as well. Right adrenal gland is normal in appearance. Two low-attenuation lesions in the upper pole of the right kidney are compatible with simple cysts, largest of  which measures 2.2 cm in diameter. No right-sided hydroureteronephrosis. Urinary bladder wall is mildly thickened and irregular, with multiple small bladder wall diverticulae.  Stomach/Bowel: The appearance of the stomach is normal. No pathologic dilatation of small bowel or colon. A few colonic diverticulae are noted, without surrounding inflammatory changes to suggest an acute diverticulitis at this time. Normal appendix.  Vascular/Lymphatic: Atherosclerosis throughout the abdominal and pelvic vasculature, without evidence of aneurysm or dissection. No lymphadenopathy noted in the abdomen or pelvis.  Reproductive: Prostate gland and seminal vesicles are unremarkable in appearance.  Other: Small bilateral inguinal hernias. The left inguinal hernia contains a small volume of fat. The right inguinal hernia contains a short segment of distal small bowel (presumably ileum), without evidence of incarceration or obstruction at this time. No high attenuation intraperitoneal or retroperitoneal fluid collections to suggest significant posttraumatic hemorrhage. No significant volume of  ascites. No pneumoperitoneum.  Musculoskeletal: S-shaped scoliosis of the lumbar spine convex to the right superiorly and to the left inferiorly. Multilevel degenerative disc disease. No acute displaced fractures or aggressive appearing lytic or blastic lesions are noted in the visualized portions of the skeleton.  IMPRESSION: 1. Nondisplaced fractures of the lateral aspects of the left fifth, sixth and seventh ribs. No associated pneumothorax. 2. No other signs of significant acute traumatic injury to the chest, abdomen or pelvis. 3. However, there are multiple pulmonary nodules in lungs bilaterally, highly concerning for metastatic disease. Correlation with PET-CT and/or biopsy in the near future is suggested if clinically appropriate. 4. Status post partial right hepatectomy. Probable treatment related changes in the left lobe of the liver,  where there is also a focal capsular retraction and calcification. 5. Status post left nephrectomy. 6. Atherosclerosis, including 3 vessel coronary artery disease. In addition, there is mild ectasia of the ascending thoracic aorta (4.0 cm in diameter). Recommend annual imaging followup by CTA or MRA. This recommendation follows 2010 ACCF/AHA/AATS/ACR/ASA/SCA/SCAI/SIR/STS/SVM Guidelines for the Diagnosis and Management of Patients with Thoracic Aortic Disease. Circulation. 2010; 121: [id number]. 7. Additional incidental findings, as detailed above.   Electronically Signed   By: Vinnie Langton M.D.   On: 10/08/2014 16:30     EKG Interpretation None      MDM   Final diagnoses:  Acute left flank pain  Rib fractures, left, closed, initial encounter   Patient presents with mechanical fall with isolated flank and back pain injuries. No abdominal tenderness on exam, plan for x-rays, pain medicines and reassessment.  Xrays show multiple fractures, patient pain not controlled, CT trauma scans ordered. Pt signed out with plan to transfer to [facility]/ trauma consult.   Medications  morphine 4 MG/ML injection 4 mg (4 mg Intramuscular Given 10/08/14 1331)  morphine 4 MG/ML injection (  Duplicate 1/58/30 9407)  morphine 4 MG/ML injection 4 mg (4 mg Intravenous Given 10/08/14 1511)  sodium chloride 0.9 % bolus 500 mL (0 mLs Intravenous Stopped 10/08/14 1656)  iohexol (OMNIPAQUE) 300 MG/ML solution 100 mL (100 mLs Intravenous Contrast Given 10/08/14 1517)  morphine 4 MG/ML injection 4 mg (4 mg Intravenous Given 10/08/14 1817)    Filed Vitals:   10/08/14 1859 10/08/14 1958 10/08/14 2211 10/09/14 0506  BP: 141/70  124/68 144/66  Pulse: 65   57  Temp: 98.8 F (37.1 C)  98.2 F (36.8 C) 98.8 F (37.1 C)  TempSrc: Oral  Oral Oral  Resp: 14  16 16   Height:  5\' 9"  (1.753 m)    Weight:  150 lb (68.04 kg)    SpO2: 94%  93% 95%    Final diagnoses:  Acute left flank pain  Rib fractures, left, closed,  initial encounter       Elnora Morrison, MD 10/17/14 1419

## 2014-10-08 NOTE — Progress Notes (Signed)
Patient arrived to unit. Made comfortable in room, educated about safety and call bell in reach. No signs of distress. Vital signs stable. [facility] Admissions contacted and made aware of patient's arrival and to clarify who MD is for patient to contact and receive orders.

## 2014-10-09 DIAGNOSIS — S2249XA Multiple fractures of ribs, unspecified side, initial encounter for closed fracture: Secondary | ICD-10-CM | POA: Diagnosis not present

## 2014-10-09 DIAGNOSIS — S2231XA Fracture of one rib, right side, initial encounter for closed fracture: Secondary | ICD-10-CM | POA: Diagnosis not present

## 2014-10-09 LAB — TROPONIN I: Troponin I: <0.03 ng/mL (ref <0.031)

## 2014-10-09 MED ORDER — TRAMADOL HCL 50 MG PO TABS: 50.0000 mg | ORAL_TABLET | Freq: Four times a day (QID) | ORAL | Status: DC | PRN

## 2014-10-09 NOTE — Progress Notes (Signed)
Discharge instructions gone over with patient. Follow up appointment as needed. Home medications gone over. Prescription given. Signs and symptoms of worsening condition gone over. Encouraged incentive spirometer use at home. Reasons to call the doctor gone over. Patient verbalized understanding of instructions.

## 2014-10-09 NOTE — Progress Notes (Signed)
[facility]  Progress Note     Subjective: Pt was up walking the halls,  Pain in ribs, but otherwise okay.  He said yesterday he had a pain in his left knee, but not that's resolved.  No N/V.  Tolerating diet well.  Wants to go home.  He says he was cutting limbs and fell backwards.  He say he did not lose consciousness.    Objective: Vital signs in last 24 hours: Temp:  [97.9 F (36.6 C)-98.8 F (37.1 C)] 98.8 F (37.1 C) (03/27 0506) Pulse Rate:  [57-65] 57 (03/27 0506) Resp:  [14-18] 16 (03/27 0506) BP: (124-144)/(66-81) 144/66 mmHg (03/27 0506) SpO2:  [93 %-100 %] 95 % (03/27 0506) Weight:  [68.04 kg (150 lb)] 68.04 kg (150 lb) (03/26 1958)    Lab Results:  CBC  Recent Labs  10/08/14 1435  WBC 9.4  HGB 14.0  HCT 42.3  PLT 188   BMET  Recent Labs  10/08/14 1435  NA 136  K 4.2  CL 101  CO2 26  GLUCOSE 112*  BUN 25*  CREATININE 1.02  CALCIUM 9.4    Imaging: Dg Chest 2 View  10/08/2014   CLINICAL DATA:  Pain following fall  EXAM: CHEST  2 VIEW  COMPARISON:  December 31, 2012  FINDINGS: There is no edema or consolidation. On the lateral view, there is a 1.1 x 1.0 cm nodular opacity overlying a lower thoracic vertebral body. This finding was not present on prior chest radiographic examination.  Heart size and pulmonary vascularity are within normal limits. No adenopathy.  There are acute appearing fractures of the lateral left fifth, sixth, and seventh ribs. There is no demonstrable pneumothorax. There is a skin fold on the left, however.  IMPRESSION: Nodular opacity posterior basilar region seen only on the lateral view. This finding was not present on prior study. Advise noncontrast enhanced chest CT to further assess.  No edema or consolidation.  Nondisplaced acute appearing fractures of the lateral left fifth, sixth, and seventh ribs are noted. No apparent pneumothorax.   Electronically Signed   By: Lowella Grip III M.D.   On:  10/08/2014 13:41   Dg Thoracic Spine 2 View  10/08/2014   CLINICAL DATA:  Fall.  Upper left chest pain.  EXAM: THORACIC SPINE - 2 VIEW  COMPARISON:  Chest radiograph 10/08/2014  FINDINGS: Surgical clips in the upper abdomen. There is scoliosis in the lumbar junction. Thoracic vertebral body heights are maintained. No evidence for a compression deformity in the thoracic spine. Alignment at the cervical-thoracic junction is within normal limits.  IMPRESSION: No acute bone abnormality in the thoracic spine.  Scoliosis in the lumbar spine.   Electronically Signed   By: Markus Daft M.D.   On: 10/08/2014 13:43   Ct Chest W Contrast  10/08/2014   CLINICAL DATA:  81 year old male with history of trauma from a fall today in his yard complaining of left-sided flank pain and left posterior/lateral rib pain with inspiration. History of renal cancer and liver cancer.  EXAM: CT CHEST, ABDOMEN, AND PELVIS WITH CONTRAST  TECHNIQUE: Multidetector CT imaging of the chest, abdomen and pelvis was performed following the standard protocol during bolus administration of intravenous contrast.  CONTRAST:  139mL OMNIPAQUE IOHEXOL 300 MG/ML  SOLN  COMPARISON:  CT of the abdomen and pelvis 04/13/2007.  FINDINGS: CT CHEST FINDINGS  Mediastinum/Lymph Nodes: No abnormal high attenuation fluid within the mediastinum to suggest posttraumatic mediastinal hematoma. No evidence of  posttraumatic aortic dissection/transection. Heart size is normal. There is no significant pericardial fluid, thickening or pericardial calcification. Severe calcifications and thickening of the aortic valve. There is atherosclerosis of the thoracic aorta, the great vessels of the mediastinum and the coronary arteries, including calcified atherosclerotic plaque in the left anterior descending, left circumflex and right coronary arteries. In addition, there is ectasia of the ascending thoracic aorta (4.0 cm in diameter). Borderline enlarged right hilar lymph node  measuring 9 mm (nonspecific). No other pathologically enlarged mediastinal or hilar lymph nodes are noted. Esophagus is unremarkable in appearance. No axillary lymphadenopathy.  Lungs/Pleura: Multiple pulmonary nodules are noted throughout the lungs bilaterally, concerning for potential metastatic disease. The largest of these nodules is in the right lower lobe (image 42 of series 4) measuring 1 cm. The largest nodule in the left lung is in the left lower lobe measuring 9 mm (image 45 of series 4). No acute consolidative airspace disease. No pleural effusions. No pneumothorax. Mild dependent subsegmental atelectasis in the lower lobes of the lungs bilaterally.  Musculoskeletal/Soft Tissues: Acute nondisplaced fractures of the lateral aspects of the left fifth, sixth and seventh ribs are noted. There is a tiny loculated of gas in the left chest wall musculature (image 47 of series 4). There are no aggressive appearing lytic or blastic lesions noted in the visualized portions of the skeleton.  CT ABDOMEN AND PELVIS FINDINGS  Hepatobiliary: Postoperative changes of partial right hepatectomy. Capsular retraction and focal calcification in the anterior aspect of the left lobe of the liver, new compared to prior examinations, potentially treatment related. 12 mm ill-defined hypovascular lesion in the central aspect of the liver near the dome (image 51 of series 2), new compared to prior examinations. Status post cholecystectomy. No signs of acute traumatic injury to the liver.  Pancreas: Pancreatic duct is slightly prominent measuring up to 4 mm. No definite obstructing mass is identified in the head of the pancreas. No pancreatic or peripancreatic inflammatory changes. Numerous surgical clips are noted adjacent to the body of the pancreas.  Spleen: Unremarkable. Specifically, no signs of acute traumatic injury to the spleen.  Adrenals/Urinary Tract: Status post left nephrectomy. No soft tissue mass in the left  retroperitoneum to suggest local recurrence of disease. Left adrenal gland is not confidently identified may be surgically absent as well. Right adrenal gland is normal in appearance. Two low-attenuation lesions in the upper pole of the right kidney are compatible with simple cysts, largest of which measures 2.2 cm in diameter. No right-sided hydroureteronephrosis. Urinary bladder wall is mildly thickened and irregular, with multiple small bladder wall diverticulae.  Stomach/Bowel: The appearance of the stomach is normal. No pathologic dilatation of small bowel or colon. A few colonic diverticulae are noted, without surrounding inflammatory changes to suggest an acute diverticulitis at this time. Normal appendix.  Vascular/Lymphatic: Atherosclerosis throughout the abdominal and pelvic vasculature, without evidence of aneurysm or dissection. No lymphadenopathy noted in the abdomen or pelvis.  Reproductive: Prostate gland and seminal vesicles are unremarkable in appearance.  Other: Small bilateral inguinal hernias. The left inguinal hernia contains a small volume of fat. The right inguinal hernia contains a short segment of distal small bowel (presumably ileum), without evidence of incarceration or obstruction at this time. No high attenuation intraperitoneal or retroperitoneal fluid collections to suggest significant posttraumatic hemorrhage. No significant volume of ascites. No pneumoperitoneum.  Musculoskeletal: S-shaped scoliosis of the lumbar spine convex to the right superiorly and to the left inferiorly. Multilevel degenerative disc disease. No  acute displaced fractures or aggressive appearing lytic or blastic lesions are noted in the visualized portions of the skeleton.  IMPRESSION: 1. Nondisplaced fractures of the lateral aspects of the left fifth, sixth and seventh ribs. No associated pneumothorax. 2. No other signs of significant acute traumatic injury to the chest, abdomen or pelvis. 3. However, there are  multiple pulmonary nodules in lungs bilaterally, highly concerning for metastatic disease. Correlation with PET-CT and/or biopsy in the near future is suggested if clinically appropriate. 4. Status post partial right hepatectomy. Probable treatment related changes in the left lobe of the liver, where there is also a focal capsular retraction and calcification. 5. Status post left nephrectomy. 6. Atherosclerosis, including 3 vessel coronary artery disease. In addition, there is mild ectasia of the ascending thoracic aorta (4.0 cm in diameter). Recommend annual imaging followup by CTA or MRA. This recommendation follows 2010 ACCF/AHA/AATS/ACR/ASA/SCA/SCAI/SIR/STS/SVM Guidelines for the Diagnosis and Management of Patients with Thoracic Aortic Disease. Circulation. 2010; 121: M546-T035. 7. Additional incidental findings, as detailed above.   Electronically Signed   By: Vinnie Langton M.D.   On: 10/08/2014 16:30   Ct Abdomen Pelvis W Contrast  10/08/2014   CLINICAL DATA:  81 year old male with history of trauma from a fall today in his yard complaining of left-sided flank pain and left posterior/lateral rib pain with inspiration. History of renal cancer and liver cancer.  EXAM: CT CHEST, ABDOMEN, AND PELVIS WITH CONTRAST  TECHNIQUE: Multidetector CT imaging of the chest, abdomen and pelvis was performed following the standard protocol during bolus administration of intravenous contrast.  CONTRAST:  174mL OMNIPAQUE IOHEXOL 300 MG/ML  SOLN  COMPARISON:  CT of the abdomen and pelvis 04/13/2007.  FINDINGS: CT CHEST FINDINGS  Mediastinum/Lymph Nodes: No abnormal high attenuation fluid within the mediastinum to suggest posttraumatic mediastinal hematoma. No evidence of posttraumatic aortic dissection/transection. Heart size is normal. There is no significant pericardial fluid, thickening or pericardial calcification. Severe calcifications and thickening of the aortic valve. There is atherosclerosis of the thoracic aorta,  the great vessels of the mediastinum and the coronary arteries, including calcified atherosclerotic plaque in the left anterior descending, left circumflex and right coronary arteries. In addition, there is ectasia of the ascending thoracic aorta (4.0 cm in diameter). Borderline enlarged right hilar lymph node measuring 9 mm (nonspecific). No other pathologically enlarged mediastinal or hilar lymph nodes are noted. Esophagus is unremarkable in appearance. No axillary lymphadenopathy.  Lungs/Pleura: Multiple pulmonary nodules are noted throughout the lungs bilaterally, concerning for potential metastatic disease. The largest of these nodules is in the right lower lobe (image 42 of series 4) measuring 1 cm. The largest nodule in the left lung is in the left lower lobe measuring 9 mm (image 45 of series 4). No acute consolidative airspace disease. No pleural effusions. No pneumothorax. Mild dependent subsegmental atelectasis in the lower lobes of the lungs bilaterally.  Musculoskeletal/Soft Tissues: Acute nondisplaced fractures of the lateral aspects of the left fifth, sixth and seventh ribs are noted. There is a tiny loculated of gas in the left chest wall musculature (image 47 of series 4). There are no aggressive appearing lytic or blastic lesions noted in the visualized portions of the skeleton.  CT ABDOMEN AND PELVIS FINDINGS  Hepatobiliary: Postoperative changes of partial right hepatectomy. Capsular retraction and focal calcification in the anterior aspect of the left lobe of the liver, new compared to prior examinations, potentially treatment related. 12 mm ill-defined hypovascular lesion in the central aspect of the liver near the dome (  image 51 of series 2), new compared to prior examinations. Status post cholecystectomy. No signs of acute traumatic injury to the liver.  Pancreas: Pancreatic duct is slightly prominent measuring up to 4 mm. No definite obstructing mass is identified in the head of the  pancreas. No pancreatic or peripancreatic inflammatory changes. Numerous surgical clips are noted adjacent to the body of the pancreas.  Spleen: Unremarkable. Specifically, no signs of acute traumatic injury to the spleen.  Adrenals/Urinary Tract: Status post left nephrectomy. No soft tissue mass in the left retroperitoneum to suggest local recurrence of disease. Left adrenal gland is not confidently identified may be surgically absent as well. Right adrenal gland is normal in appearance. Two low-attenuation lesions in the upper pole of the right kidney are compatible with simple cysts, largest of which measures 2.2 cm in diameter. No right-sided hydroureteronephrosis. Urinary bladder wall is mildly thickened and irregular, with multiple small bladder wall diverticulae.  Stomach/Bowel: The appearance of the stomach is normal. No pathologic dilatation of small bowel or colon. A few colonic diverticulae are noted, without surrounding inflammatory changes to suggest an acute diverticulitis at this time. Normal appendix.  Vascular/Lymphatic: Atherosclerosis throughout the abdominal and pelvic vasculature, without evidence of aneurysm or dissection. No lymphadenopathy noted in the abdomen or pelvis.  Reproductive: Prostate gland and seminal vesicles are unremarkable in appearance.  Other: Small bilateral inguinal hernias. The left inguinal hernia contains a small volume of fat. The right inguinal hernia contains a short segment of distal small bowel (presumably ileum), without evidence of incarceration or obstruction at this time. No high attenuation intraperitoneal or retroperitoneal fluid collections to suggest significant posttraumatic hemorrhage. No significant volume of ascites. No pneumoperitoneum.  Musculoskeletal: S-shaped scoliosis of the lumbar spine convex to the right superiorly and to the left inferiorly. Multilevel degenerative disc disease. No acute displaced fractures or aggressive appearing lytic or  blastic lesions are noted in the visualized portions of the skeleton.  IMPRESSION: 1. Nondisplaced fractures of the lateral aspects of the left fifth, sixth and seventh ribs. No associated pneumothorax. 2. No other signs of significant acute traumatic injury to the chest, abdomen or pelvis. 3. However, there are multiple pulmonary nodules in lungs bilaterally, highly concerning for metastatic disease. Correlation with PET-CT and/or biopsy in the near future is suggested if clinically appropriate. 4. Status post partial right hepatectomy. Probable treatment related changes in the left lobe of the liver, where there is also a focal capsular retraction and calcification. 5. Status post left nephrectomy. 6. Atherosclerosis, including 3 vessel coronary artery disease. In addition, there is mild ectasia of the ascending thoracic aorta (4.0 cm in diameter). Recommend annual imaging followup by CTA or MRA. This recommendation follows 2010 ACCF/AHA/AATS/ACR/ASA/SCA/SCAI/SIR/STS/SVM Guidelines for the Diagnosis and Management of Patients with Thoracic Aortic Disease. Circulation. 2010; 121: [id number]. 7. Additional incidental findings, as detailed above.   Electronically Signed   By: Vinnie Langton M.D.   On: 10/08/2014 16:30     PE: General: pleasant, WD/WN white male who is laying in bed in NAD HEENT: head is normocephalic, atraumatic.  Sclera are noninjected.  PERRL.  Ears and nose without any masses or lesions.  Mouth is pink and moist Heart: regular, rate, and rhythm.  Normal s1,s2. No obvious murmurs, gallops, or rubs noted.  Palpable radial and pedal pulses bilaterally Lungs: CTAB, no wheezes, rhonchi, or rales noted.  Respiratory effort nonlabored Abd: soft, NT/ND, +BS, no masses, hernias, or organomegaly MS: all 4 extremities are symmetrical with no cyanosis, clubbing,  or edema, Left knee without pain, tenderness, or laxity Skin: warm and dry with no masses, lesions, or rashes Psych: A&Ox3 with an  appropriate affect.   Assessment/Plan: Ground level fall Left rib fractures 3 - 5 nondisplaced - pum toilet, pain control Pulmonary nodules - Per primary team Liver mass - sees Dr Crisoforo Oxford in May. May want to move this up. PET scan may be helpful but patient refused.  Hx of liver resection - times 2 [facility] Dr. Crisoforo Oxford VTE - SCD's, Lovenox FEN - reg diet Dispo -- Ambulating well, good from trauma perspective to d/c home, d/c up to medical team   Coralie Keens, PA-C Pager: Christine PA Pager: [phone]   10/09/2014

## 2014-10-09 NOTE — Discharge Summary (Signed)
PATIENT DETAILS Name: Corey Davila Age: 81 y.o. Sex: male Date of Birth: April 07, 1924 MRN: [id number]. Admitting Physician: Bonnielee Haff, MD LGX:QJJHER,DEYCX W, MD  Admit Date: 10/08/2014 Discharge date: 10/09/2014  Recommendations for Outpatient Follow-up:  1. Has multiple small lung nodules seen on CT of the chest-given history of malignancy-requires a PET scan-deferred to the outpatient setting.  PRIMARY DISCHARGE DIAGNOSIS:  Active Problems:   Rib fractures   Chest pain      PAST MEDICAL HISTORY: Past Medical History  Diagnosis Date  . Liver cancer     partial liver resection 06/18/2007; partial liver resection 08/29/2010, not renal  . History of kidney cancer 06/19/1979    L renal taken out   . Mixed hyperlipidemia   . Cough   . Arthritis   . Wears hearing aid     both ears    DISCHARGE MEDICATIONS: Current Discharge Medication List    CONTINUE these medications which have CHANGED   Details  traMADol (ULTRAM) 50 MG tablet Take 1 tablet (50 mg total) by mouth every 6 (six) hours as needed. Qty: 30 tablet, Refills: 0      CONTINUE these medications which have NOT CHANGED   Details  Calcium Citrate 200 MG TABS Take 1 tablet by mouth daily.    Calcium-Vit D3-Phytosterols 315-250-200 MG-UNIT-MG TABS Take 1 tablet by mouth daily.    docusate sodium (COLACE) 100 MG capsule Take 100 mg by mouth daily.    Glucosamine-Chondroitin 250-200 MG TABS Take 1 tablet by mouth daily. Regular strength tabs.    LYCOPENE PO Take 1 tablet by mouth daily.    methylPREDNIsolone (MEDROL DOSPACK) 4 MG tablet follow package directions    predniSONE (DELTASONE) 5 MG tablet Take 5 mg by mouth.    aspirin 81 MG tablet Take 81 mg by mouth daily.    CRESTOR 20 MG tablet Take 1 tablet by mouth every other day.    desmopressin (DDAVP) 0.2 MG tablet Take 0.5 tablets by mouth at bedtime.    finasteride (PROSCAR) 5 MG tablet Take 1 tablet by mouth daily.    Milk Thistle 250 MG CAPS  Take 1 capsule by mouth daily.    Misc Natural Products (OSTEO BI-FLEX JOINT SHIELD PO) Take 1 tablet by mouth 2 (two) times daily.    Multiple Minerals-Vitamins (CALCIUM CITRATE PLUS/MAGNESIUM PO) Take 1 tablet by mouth daily.    Multiple Vitamin (MULTIVITAMIN) tablet Take 1 tablet by mouth daily.    vitamin B-12 (CYANOCOBALAMIN) 250 MCG tablet Take 250 mcg by mouth daily.      STOP taking these medications     HYDROcodone-acetaminophen (NORCO/VICODIN) 5-325 MG per tablet         ALLERGIES:   Allergies  Allergen Reactions  . Tessalon [Benzonatate]   . Tussicaps [Hydrocod Polst-Cpm Polst Er]   . Bee Venom   . Dilantin [Phenytoin]   . Mevacor [Lovastatin]   . Niacin And Related     Rash   . Septra Ds [Sulfamethoxazole-Trimethoprim]     BRIEF HPI:  See H&P, Labs, Consult and Test reports for all details in brief, patient is a 81 year old male with history of liver cancer status post liver resection, history of kidney cancer status post nephrectomy was brought to the hospital after falling after trying to prune tree. Patient was found to have rib fracture and admitted to the hospitalist service for pain control.  CONSULTATIONS:   general surgery  PERTINENT RADIOLOGIC STUDIES: Dg Chest 2 View  10/08/2014  CLINICAL DATA:  Pain following fall  EXAM: CHEST  2 VIEW  COMPARISON:  December 31, 2012  FINDINGS: There is no edema or consolidation. On the lateral view, there is a 1.1 x 1.0 cm nodular opacity overlying a lower thoracic vertebral body. This finding was not present on prior chest radiographic examination.  Heart size and pulmonary vascularity are within normal limits. No adenopathy.  There are acute appearing fractures of the lateral left fifth, sixth, and seventh ribs. There is no demonstrable pneumothorax. There is a skin fold on the left, however.  IMPRESSION: Nodular opacity posterior basilar region seen only on the lateral view. This finding was not present on prior study.  Advise noncontrast enhanced chest CT to further assess.  No edema or consolidation.  Nondisplaced acute appearing fractures of the lateral left fifth, sixth, and seventh ribs are noted. No apparent pneumothorax.   Electronically Signed   By: Lowella Grip III M.D.   On: 10/08/2014 13:41   Dg Thoracic Spine 2 View  10/08/2014   CLINICAL DATA:  Fall.  Upper left chest pain.  EXAM: THORACIC SPINE - 2 VIEW  COMPARISON:  Chest radiograph 10/08/2014  FINDINGS: Surgical clips in the upper abdomen. There is scoliosis in the lumbar junction. Thoracic vertebral body heights are maintained. No evidence for a compression deformity in the thoracic spine. Alignment at the cervical-thoracic junction is within normal limits.  IMPRESSION: No acute bone abnormality in the thoracic spine.  Scoliosis in the lumbar spine.   Electronically Signed   By: Markus Daft M.D.   On: 10/08/2014 13:43   Ct Chest W Contrast  10/08/2014   CLINICAL DATA:  81 year old male with history of trauma from a fall today in his yard complaining of left-sided flank pain and left posterior/lateral rib pain with inspiration. History of renal cancer and liver cancer.  EXAM: CT CHEST, ABDOMEN, AND PELVIS WITH CONTRAST  TECHNIQUE: Multidetector CT imaging of the chest, abdomen and pelvis was performed following the standard protocol during bolus administration of intravenous contrast.  CONTRAST:  123mL OMNIPAQUE IOHEXOL 300 MG/ML  SOLN  COMPARISON:  CT of the abdomen and pelvis 04/13/2007.  FINDINGS: CT CHEST FINDINGS  Mediastinum/Lymph Nodes: No abnormal high attenuation fluid within the mediastinum to suggest posttraumatic mediastinal hematoma. No evidence of posttraumatic aortic dissection/transection. Heart size is normal. There is no significant pericardial fluid, thickening or pericardial calcification. Severe calcifications and thickening of the aortic valve. There is atherosclerosis of the thoracic aorta, the great vessels of the mediastinum and  the coronary arteries, including calcified atherosclerotic plaque in the left anterior descending, left circumflex and right coronary arteries. In addition, there is ectasia of the ascending thoracic aorta (4.0 cm in diameter). Borderline enlarged right hilar lymph node measuring 9 mm (nonspecific). No other pathologically enlarged mediastinal or hilar lymph nodes are noted. Esophagus is unremarkable in appearance. No axillary lymphadenopathy.  Lungs/Pleura: Multiple pulmonary nodules are noted throughout the lungs bilaterally, concerning for potential metastatic disease. The largest of these nodules is in the right lower lobe (image 42 of series 4) measuring 1 cm. The largest nodule in the left lung is in the left lower lobe measuring 9 mm (image 45 of series 4). No acute consolidative airspace disease. No pleural effusions. No pneumothorax. Mild dependent subsegmental atelectasis in the lower lobes of the lungs bilaterally.  Musculoskeletal/Soft Tissues: Acute nondisplaced fractures of the lateral aspects of the left fifth, sixth and seventh ribs are noted. There is a tiny loculated of gas in  the left chest wall musculature (image 47 of series 4). There are no aggressive appearing lytic or blastic lesions noted in the visualized portions of the skeleton.  CT ABDOMEN AND PELVIS FINDINGS  Hepatobiliary: Postoperative changes of partial right hepatectomy. Capsular retraction and focal calcification in the anterior aspect of the left lobe of the liver, new compared to prior examinations, potentially treatment related. 12 mm ill-defined hypovascular lesion in the central aspect of the liver near the dome (image 51 of series 2), new compared to prior examinations. Status post cholecystectomy. No signs of acute traumatic injury to the liver.  Pancreas: Pancreatic duct is slightly prominent measuring up to 4 mm. No definite obstructing mass is identified in the head of the pancreas. No pancreatic or peripancreatic  inflammatory changes. Numerous surgical clips are noted adjacent to the body of the pancreas.  Spleen: Unremarkable. Specifically, no signs of acute traumatic injury to the spleen.  Adrenals/Urinary Tract: Status post left nephrectomy. No soft tissue mass in the left retroperitoneum to suggest local recurrence of disease. Left adrenal gland is not confidently identified may be surgically absent as well. Right adrenal gland is normal in appearance. Two low-attenuation lesions in the upper pole of the right kidney are compatible with simple cysts, largest of which measures 2.2 cm in diameter. No right-sided hydroureteronephrosis. Urinary bladder wall is mildly thickened and irregular, with multiple small bladder wall diverticulae.  Stomach/Bowel: The appearance of the stomach is normal. No pathologic dilatation of small bowel or colon. A few colonic diverticulae are noted, without surrounding inflammatory changes to suggest an acute diverticulitis at this time. Normal appendix.  Vascular/Lymphatic: Atherosclerosis throughout the abdominal and pelvic vasculature, without evidence of aneurysm or dissection. No lymphadenopathy noted in the abdomen or pelvis.  Reproductive: Prostate gland and seminal vesicles are unremarkable in appearance.  Other: Small bilateral inguinal hernias. The left inguinal hernia contains a small volume of fat. The right inguinal hernia contains a short segment of distal small bowel (presumably ileum), without evidence of incarceration or obstruction at this time. No high attenuation intraperitoneal or retroperitoneal fluid collections to suggest significant posttraumatic hemorrhage. No significant volume of ascites. No pneumoperitoneum.  Musculoskeletal: S-shaped scoliosis of the lumbar spine convex to the right superiorly and to the left inferiorly. Multilevel degenerative disc disease. No acute displaced fractures or aggressive appearing lytic or blastic lesions are noted in the visualized  portions of the skeleton.  IMPRESSION: 1. Nondisplaced fractures of the lateral aspects of the left fifth, sixth and seventh ribs. No associated pneumothorax. 2. No other signs of significant acute traumatic injury to the chest, abdomen or pelvis. 3. However, there are multiple pulmonary nodules in lungs bilaterally, highly concerning for metastatic disease. Correlation with PET-CT and/or biopsy in the near future is suggested if clinically appropriate. 4. Status post partial right hepatectomy. Probable treatment related changes in the left lobe of the liver, where there is also a focal capsular retraction and calcification. 5. Status post left nephrectomy. 6. Atherosclerosis, including 3 vessel coronary artery disease. In addition, there is mild ectasia of the ascending thoracic aorta (4.0 cm in diameter). Recommend annual imaging followup by CTA or MRA. This recommendation follows 2010 ACCF/AHA/AATS/ACR/ASA/SCA/SCAI/SIR/STS/SVM Guidelines for the Diagnosis and Management of Patients with Thoracic Aortic Disease. Circulation. 2010; 121: V371-G626. 7. Additional incidental findings, as detailed above.   Electronically Signed   By: Vinnie Langton M.D.   On: 10/08/2014 16:30   Ct Abdomen Pelvis W Contrast  10/08/2014   CLINICAL DATA:  81 year old male  with history of trauma from a fall today in his yard complaining of left-sided flank pain and left posterior/lateral rib pain with inspiration. History of renal cancer and liver cancer.  EXAM: CT CHEST, ABDOMEN, AND PELVIS WITH CONTRAST  TECHNIQUE: Multidetector CT imaging of the chest, abdomen and pelvis was performed following the standard protocol during bolus administration of intravenous contrast.  CONTRAST:  140mL OMNIPAQUE IOHEXOL 300 MG/ML  SOLN  COMPARISON:  CT of the abdomen and pelvis 04/13/2007.  FINDINGS: CT CHEST FINDINGS  Mediastinum/Lymph Nodes: No abnormal high attenuation fluid within the mediastinum to suggest posttraumatic mediastinal hematoma.  No evidence of posttraumatic aortic dissection/transection. Heart size is normal. There is no significant pericardial fluid, thickening or pericardial calcification. Severe calcifications and thickening of the aortic valve. There is atherosclerosis of the thoracic aorta, the great vessels of the mediastinum and the coronary arteries, including calcified atherosclerotic plaque in the left anterior descending, left circumflex and right coronary arteries. In addition, there is ectasia of the ascending thoracic aorta (4.0 cm in diameter). Borderline enlarged right hilar lymph node measuring 9 mm (nonspecific). No other pathologically enlarged mediastinal or hilar lymph nodes are noted. Esophagus is unremarkable in appearance. No axillary lymphadenopathy.  Lungs/Pleura: Multiple pulmonary nodules are noted throughout the lungs bilaterally, concerning for potential metastatic disease. The largest of these nodules is in the right lower lobe (image 42 of series 4) measuring 1 cm. The largest nodule in the left lung is in the left lower lobe measuring 9 mm (image 45 of series 4). No acute consolidative airspace disease. No pleural effusions. No pneumothorax. Mild dependent subsegmental atelectasis in the lower lobes of the lungs bilaterally.  Musculoskeletal/Soft Tissues: Acute nondisplaced fractures of the lateral aspects of the left fifth, sixth and seventh ribs are noted. There is a tiny loculated of gas in the left chest wall musculature (image 47 of series 4). There are no aggressive appearing lytic or blastic lesions noted in the visualized portions of the skeleton.  CT ABDOMEN AND PELVIS FINDINGS  Hepatobiliary: Postoperative changes of partial right hepatectomy. Capsular retraction and focal calcification in the anterior aspect of the left lobe of the liver, new compared to prior examinations, potentially treatment related. 12 mm ill-defined hypovascular lesion in the central aspect of the liver near the dome (image  51 of series 2), new compared to prior examinations. Status post cholecystectomy. No signs of acute traumatic injury to the liver.  Pancreas: Pancreatic duct is slightly prominent measuring up to 4 mm. No definite obstructing mass is identified in the head of the pancreas. No pancreatic or peripancreatic inflammatory changes. Numerous surgical clips are noted adjacent to the body of the pancreas.  Spleen: Unremarkable. Specifically, no signs of acute traumatic injury to the spleen.  Adrenals/Urinary Tract: Status post left nephrectomy. No soft tissue mass in the left retroperitoneum to suggest local recurrence of disease. Left adrenal gland is not confidently identified may be surgically absent as well. Right adrenal gland is normal in appearance. Two low-attenuation lesions in the upper pole of the right kidney are compatible with simple cysts, largest of which measures 2.2 cm in diameter. No right-sided hydroureteronephrosis. Urinary bladder wall is mildly thickened and irregular, with multiple small bladder wall diverticulae.  Stomach/Bowel: The appearance of the stomach is normal. No pathologic dilatation of small bowel or colon. A few colonic diverticulae are noted, without surrounding inflammatory changes to suggest an acute diverticulitis at this time. Normal appendix.  Vascular/Lymphatic: Atherosclerosis throughout the abdominal and pelvic vasculature, without  evidence of aneurysm or dissection. No lymphadenopathy noted in the abdomen or pelvis.  Reproductive: Prostate gland and seminal vesicles are unremarkable in appearance.  Other: Small bilateral inguinal hernias. The left inguinal hernia contains a small volume of fat. The right inguinal hernia contains a short segment of distal small bowel (presumably ileum), without evidence of incarceration or obstruction at this time. No high attenuation intraperitoneal or retroperitoneal fluid collections to suggest significant posttraumatic hemorrhage. No  significant volume of ascites. No pneumoperitoneum.  Musculoskeletal: S-shaped scoliosis of the lumbar spine convex to the right superiorly and to the left inferiorly. Multilevel degenerative disc disease. No acute displaced fractures or aggressive appearing lytic or blastic lesions are noted in the visualized portions of the skeleton.  IMPRESSION: 1. Nondisplaced fractures of the lateral aspects of the left fifth, sixth and seventh ribs. No associated pneumothorax. 2. No other signs of significant acute traumatic injury to the chest, abdomen or pelvis. 3. However, there are multiple pulmonary nodules in lungs bilaterally, highly concerning for metastatic disease. Correlation with PET-CT and/or biopsy in the near future is suggested if clinically appropriate. 4. Status post partial right hepatectomy. Probable treatment related changes in the left lobe of the liver, where there is also a focal capsular retraction and calcification. 5. Status post left nephrectomy. 6. Atherosclerosis, including 3 vessel coronary artery disease. In addition, there is mild ectasia of the ascending thoracic aorta (4.0 cm in diameter). Recommend annual imaging followup by CTA or MRA. This recommendation follows 2010 ACCF/AHA/AATS/ACR/ASA/SCA/SCAI/SIR/STS/SVM Guidelines for the Diagnosis and Management of Patients with Thoracic Aortic Disease. Circulation. 2010; 121: [id number]. 7. Additional incidental findings, as detailed above.   Electronically Signed   By: Vinnie Langton M.D.   On: 10/08/2014 16:30     PERTINENT LAB RESULTS: CBC:  Recent Labs  10/08/14 1435  WBC 9.4  HGB 14.0  HCT 42.3  PLT 188   CMET CMP     Component Value Date/Time   NA 136 10/08/2014 1435   K 4.2 10/08/2014 1435   CL 101 10/08/2014 1435   CO2 26 10/08/2014 1435   GLUCOSE 112* 10/08/2014 1435   BUN 25* 10/08/2014 1435   CREATININE 1.02 10/08/2014 1435   CREATININE 0.95 12/03/2012 1137   CALCIUM 9.4 10/08/2014 1435   PROT 6.1 12/03/2012  1137   ALBUMIN 3.2* 12/03/2012 1137   AST 19 12/03/2012 1137   ALT 14 12/03/2012 1137   ALKPHOS 90 12/03/2012 1137   BILITOT 0.9 12/03/2012 1137   GFRNONAA 63* 10/08/2014 1435   GFRAA 74* 10/08/2014 1435    GFR Estimated Creatinine Clearance: 48.1 mL/min (by C-G formula based on Cr of 1.02). No results for input(s): LIPASE, AMYLASE in the last 72 hours.  Recent Labs  10/08/14 2205 10/09/14 0300  TROPONINI <0.03 <0.03   Invalid input(s): POCBNP No results for input(s): DDIMER in the last 72 hours. No results for input(s): HGBA1C in the last 72 hours. No results for input(s): CHOL, HDL, LDLCALC, TRIG, CHOLHDL, LDLDIRECT in the last 72 hours. No results for input(s): TSH, T4TOTAL, T3FREE, THYROIDAB in the last 72 hours.  Invalid input(s): FREET3 No results for input(s): VITAMINB12, FOLATE, FERRITIN, TIBC, IRON, RETICCTPCT in the last 72 hours. Coags: No results for input(s): INR in the last 72 hours.  Invalid input(s): PT Microbiology: No results found for this or any previous visit (from the past 240 hour(s)).   BRIEF HOSPITAL COURSE:  Patient is an 81 year old male who presented after falling attempting to prune a tree.  Further evaluation revealed multiple rib fractures and was admitted primarily for pain control. Patient was seen by the trauma service who did not have any further recommendations apart from supportive care. Patient and family were requesting discharge, patient did not have any pleuritic pain on my exam. He was requesting discharge to that he could spend time with his family during Easter. Patient does have small multiple nodules on the CT chest, given his history of liver and renal malignancy, obviously metastatic disease is a concern. Patient and daughter have indicated that they do not want any further workup at this time and even if it is metastatic disease day with not want treatment or further evaluation. He claims to have a follow-up appointment with their  hepatic surgeon-Dr. Crisoforo Oxford at [facility] next month. So at this time, further workup has been deferred to the outpatient setting   TODAY-DAY OF DISCHARGE:  Subjective:   Corey Davila today has no headache,no chest abdominal pain,no new weakness tingling or numbness, feels much better wants to go home today.   Objective:   Blood pressure 144/66, pulse 57, temperature 98.8 F (37.1 C), temperature source Oral, resp. rate 16, height 5\' 9"  (1.753 m), weight 68.04 kg (150 lb), SpO2 95 %.  Intake/Output Summary (Last 24 hours) at 10/09/14 1038 Last data filed at 10/09/14 0911  Gross per 24 hour  Intake    360 ml  Output      0 ml  Net    360 ml   Filed Weights   10/08/14 1225 10/08/14 1958  Weight: 68.04 kg (150 lb) 68.04 kg (150 lb)    Exam Awake Alert, Oriented *3, No new F.N deficits, Normal affect [place].AT,PERRAL Supple Neck,No JVD, No cervical lymphadenopathy appriciated.  Symmetrical Chest wall movement, Good air movement bilaterally, CTAB RRR,No Gallops,Rubs or new Murmurs, No Parasternal Heave +ve B.Sounds, Abd Soft, Non tender, No organomegaly appriciated, No rebound -guarding or rigidity. No Cyanosis, Clubbing or edema, No new Rash or bruise  DISCHARGE CONDITION: Stable  DISPOSITION: Home  DISCHARGE INSTRUCTIONS:    Activity:  As tolerated   Diet recommendation: Heart Healthy diet  Discharge Instructions    Call MD for:  severe uncontrolled pain    Complete by:  As directed      Diet - low sodium heart healthy    Complete by:  As directed      Increase activity slowly    Complete by:  As directed            Follow-up Information    Follow up with Gara Kroner, MD. Schedule an appointment as soon as possible for a visit in 1 week.   Specialty:  Family Medicine   Contact information:   [place] [address] [phone]       Total Time spent on discharge equals 45 minutes.  SignedOren Binet 10/09/2014 10:38  AM

## 2014-10-09 NOTE — Progress Notes (Signed)
UR completed 

## 2014-10-10 ENCOUNTER — Ambulatory Visit: Payer: Medicare Other | Admitting: Rehabilitation

## 2014-10-13 ENCOUNTER — Ambulatory Visit: Payer: Medicare Other | Admitting: Rehabilitation

## 2014-10-20 DIAGNOSIS — R918 Other nonspecific abnormal finding of lung field: Secondary | ICD-10-CM | POA: Diagnosis not present

## 2014-10-20 DIAGNOSIS — N400 Enlarged prostate without lower urinary tract symptoms: Secondary | ICD-10-CM | POA: Diagnosis not present

## 2014-10-20 DIAGNOSIS — Z8505 Personal history of malignant neoplasm of liver: Secondary | ICD-10-CM | POA: Diagnosis not present

## 2014-10-20 DIAGNOSIS — S2242XD Multiple fractures of ribs, left side, subsequent encounter for fracture with routine healing: Secondary | ICD-10-CM | POA: Diagnosis not present

## 2014-10-20 DIAGNOSIS — R7309 Other abnormal glucose: Secondary | ICD-10-CM | POA: Diagnosis not present

## 2014-10-20 DIAGNOSIS — Z91030 Bee allergy status: Secondary | ICD-10-CM | POA: Diagnosis not present

## 2014-10-20 DIAGNOSIS — E782 Mixed hyperlipidemia: Secondary | ICD-10-CM | POA: Diagnosis not present

## 2014-10-26 DIAGNOSIS — Z8505 Personal history of malignant neoplasm of liver: Secondary | ICD-10-CM | POA: Diagnosis not present

## 2014-10-26 DIAGNOSIS — N390 Urinary tract infection, site not specified: Secondary | ICD-10-CM | POA: Diagnosis not present

## 2014-10-26 DIAGNOSIS — Z9181 History of falling: Secondary | ICD-10-CM | POA: Diagnosis not present

## 2014-10-26 DIAGNOSIS — N3001 Acute cystitis with hematuria: Secondary | ICD-10-CM | POA: Diagnosis not present

## 2014-10-26 DIAGNOSIS — S2241XD Multiple fractures of ribs, right side, subsequent encounter for fracture with routine healing: Secondary | ICD-10-CM | POA: Diagnosis not present

## 2014-10-27 DIAGNOSIS — Z9181 History of falling: Secondary | ICD-10-CM | POA: Diagnosis not present

## 2014-10-27 DIAGNOSIS — N390 Urinary tract infection, site not specified: Secondary | ICD-10-CM | POA: Diagnosis not present

## 2014-10-27 DIAGNOSIS — Z8505 Personal history of malignant neoplasm of liver: Secondary | ICD-10-CM | POA: Diagnosis not present

## 2014-10-27 DIAGNOSIS — S2241XD Multiple fractures of ribs, right side, subsequent encounter for fracture with routine healing: Secondary | ICD-10-CM | POA: Diagnosis not present

## 2014-11-01 DIAGNOSIS — S2241XD Multiple fractures of ribs, right side, subsequent encounter for fracture with routine healing: Secondary | ICD-10-CM | POA: Diagnosis not present

## 2014-11-01 DIAGNOSIS — Z9181 History of falling: Secondary | ICD-10-CM | POA: Diagnosis not present

## 2014-11-01 DIAGNOSIS — Z8505 Personal history of malignant neoplasm of liver: Secondary | ICD-10-CM | POA: Diagnosis not present

## 2014-11-01 DIAGNOSIS — N390 Urinary tract infection, site not specified: Secondary | ICD-10-CM | POA: Diagnosis not present

## 2014-11-02 DIAGNOSIS — R42 Dizziness and giddiness: Secondary | ICD-10-CM | POA: Diagnosis not present

## 2014-11-02 DIAGNOSIS — N390 Urinary tract infection, site not specified: Secondary | ICD-10-CM | POA: Diagnosis not present

## 2014-11-04 DIAGNOSIS — Z9181 History of falling: Secondary | ICD-10-CM | POA: Diagnosis not present

## 2014-11-04 DIAGNOSIS — S2241XD Multiple fractures of ribs, right side, subsequent encounter for fracture with routine healing: Secondary | ICD-10-CM | POA: Diagnosis not present

## 2014-11-04 DIAGNOSIS — Z8505 Personal history of malignant neoplasm of liver: Secondary | ICD-10-CM | POA: Diagnosis not present

## 2014-11-04 DIAGNOSIS — N390 Urinary tract infection, site not specified: Secondary | ICD-10-CM | POA: Diagnosis not present

## 2014-11-08 DIAGNOSIS — Z9181 History of falling: Secondary | ICD-10-CM | POA: Diagnosis not present

## 2014-11-08 DIAGNOSIS — N390 Urinary tract infection, site not specified: Secondary | ICD-10-CM | POA: Diagnosis not present

## 2014-11-08 DIAGNOSIS — Z8505 Personal history of malignant neoplasm of liver: Secondary | ICD-10-CM | POA: Diagnosis not present

## 2014-11-08 DIAGNOSIS — S2241XD Multiple fractures of ribs, right side, subsequent encounter for fracture with routine healing: Secondary | ICD-10-CM | POA: Diagnosis not present

## 2014-11-10 DIAGNOSIS — Z8505 Personal history of malignant neoplasm of liver: Secondary | ICD-10-CM | POA: Diagnosis not present

## 2014-11-10 DIAGNOSIS — Z9181 History of falling: Secondary | ICD-10-CM | POA: Diagnosis not present

## 2014-11-10 DIAGNOSIS — S2241XD Multiple fractures of ribs, right side, subsequent encounter for fracture with routine healing: Secondary | ICD-10-CM | POA: Diagnosis not present

## 2014-11-10 DIAGNOSIS — N390 Urinary tract infection, site not specified: Secondary | ICD-10-CM | POA: Diagnosis not present

## 2014-11-10 DIAGNOSIS — R42 Dizziness and giddiness: Secondary | ICD-10-CM | POA: Diagnosis not present

## 2014-11-10 DIAGNOSIS — S2242XD Multiple fractures of ribs, left side, subsequent encounter for fracture with routine healing: Secondary | ICD-10-CM | POA: Diagnosis not present

## 2014-11-11 DIAGNOSIS — Z8505 Personal history of malignant neoplasm of liver: Secondary | ICD-10-CM | POA: Diagnosis not present

## 2014-11-11 DIAGNOSIS — Z9181 History of falling: Secondary | ICD-10-CM | POA: Diagnosis not present

## 2014-11-11 DIAGNOSIS — N390 Urinary tract infection, site not specified: Secondary | ICD-10-CM | POA: Diagnosis not present

## 2014-11-11 DIAGNOSIS — S2241XD Multiple fractures of ribs, right side, subsequent encounter for fracture with routine healing: Secondary | ICD-10-CM | POA: Diagnosis not present

## 2014-11-14 DIAGNOSIS — N390 Urinary tract infection, site not specified: Secondary | ICD-10-CM | POA: Diagnosis not present

## 2014-11-14 DIAGNOSIS — S2241XD Multiple fractures of ribs, right side, subsequent encounter for fracture with routine healing: Secondary | ICD-10-CM | POA: Diagnosis not present

## 2014-11-14 DIAGNOSIS — Z9181 History of falling: Secondary | ICD-10-CM | POA: Diagnosis not present

## 2014-11-14 DIAGNOSIS — Z8505 Personal history of malignant neoplasm of liver: Secondary | ICD-10-CM | POA: Diagnosis not present

## 2014-11-15 DIAGNOSIS — Z9181 History of falling: Secondary | ICD-10-CM | POA: Diagnosis not present

## 2014-11-15 DIAGNOSIS — N390 Urinary tract infection, site not specified: Secondary | ICD-10-CM | POA: Diagnosis not present

## 2014-11-15 DIAGNOSIS — Z8505 Personal history of malignant neoplasm of liver: Secondary | ICD-10-CM | POA: Diagnosis not present

## 2014-11-15 DIAGNOSIS — S2241XD Multiple fractures of ribs, right side, subsequent encounter for fracture with routine healing: Secondary | ICD-10-CM | POA: Diagnosis not present

## 2014-11-17 DIAGNOSIS — N390 Urinary tract infection, site not specified: Secondary | ICD-10-CM | POA: Diagnosis not present

## 2014-11-17 DIAGNOSIS — Z8505 Personal history of malignant neoplasm of liver: Secondary | ICD-10-CM | POA: Diagnosis not present

## 2014-11-17 DIAGNOSIS — Z9181 History of falling: Secondary | ICD-10-CM | POA: Diagnosis not present

## 2014-11-17 DIAGNOSIS — S2241XD Multiple fractures of ribs, right side, subsequent encounter for fracture with routine healing: Secondary | ICD-10-CM | POA: Diagnosis not present

## 2014-11-21 DIAGNOSIS — S2241XD Multiple fractures of ribs, right side, subsequent encounter for fracture with routine healing: Secondary | ICD-10-CM | POA: Diagnosis not present

## 2014-11-21 DIAGNOSIS — Z8505 Personal history of malignant neoplasm of liver: Secondary | ICD-10-CM | POA: Diagnosis not present

## 2014-11-21 DIAGNOSIS — Z9181 History of falling: Secondary | ICD-10-CM | POA: Diagnosis not present

## 2014-11-21 DIAGNOSIS — N390 Urinary tract infection, site not specified: Secondary | ICD-10-CM | POA: Diagnosis not present

## 2014-11-23 DIAGNOSIS — S2241XD Multiple fractures of ribs, right side, subsequent encounter for fracture with routine healing: Secondary | ICD-10-CM | POA: Diagnosis not present

## 2014-11-23 DIAGNOSIS — Z8505 Personal history of malignant neoplasm of liver: Secondary | ICD-10-CM | POA: Diagnosis not present

## 2014-11-23 DIAGNOSIS — N390 Urinary tract infection, site not specified: Secondary | ICD-10-CM | POA: Diagnosis not present

## 2014-11-23 DIAGNOSIS — Z9181 History of falling: Secondary | ICD-10-CM | POA: Diagnosis not present

## 2014-11-24 DIAGNOSIS — Z9181 History of falling: Secondary | ICD-10-CM | POA: Diagnosis not present

## 2014-11-24 DIAGNOSIS — S2241XD Multiple fractures of ribs, right side, subsequent encounter for fracture with routine healing: Secondary | ICD-10-CM | POA: Diagnosis not present

## 2014-11-24 DIAGNOSIS — Z8505 Personal history of malignant neoplasm of liver: Secondary | ICD-10-CM | POA: Diagnosis not present

## 2014-11-24 DIAGNOSIS — N390 Urinary tract infection, site not specified: Secondary | ICD-10-CM | POA: Diagnosis not present

## 2014-12-01 DIAGNOSIS — Z8505 Personal history of malignant neoplasm of liver: Secondary | ICD-10-CM | POA: Diagnosis not present

## 2014-12-01 DIAGNOSIS — M545 Low back pain: Secondary | ICD-10-CM | POA: Diagnosis not present

## 2014-12-01 DIAGNOSIS — R42 Dizziness and giddiness: Secondary | ICD-10-CM | POA: Diagnosis not present

## 2014-12-01 DIAGNOSIS — S2242XD Multiple fractures of ribs, left side, subsequent encounter for fracture with routine healing: Secondary | ICD-10-CM | POA: Diagnosis not present

## 2014-12-02 ENCOUNTER — Ambulatory Visit (HOSPITAL_BASED_OUTPATIENT_CLINIC_OR_DEPARTMENT_OTHER)
Admission: RE | Admit: 2014-12-02 | Discharge: 2014-12-02 | Disposition: A | Discharge status: Home / Self Care | Payer: Medicare Other | Source: Ambulatory Visit | Attending: Family Medicine | Admitting: Family Medicine

## 2014-12-02 ENCOUNTER — Other Ambulatory Visit (HOSPITAL_BASED_OUTPATIENT_CLINIC_OR_DEPARTMENT_OTHER): Payer: Self-pay | Admitting: Family Medicine

## 2014-12-02 DIAGNOSIS — M5136 Other intervertebral disc degeneration, lumbar region: Secondary | ICD-10-CM | POA: Diagnosis not present

## 2014-12-02 DIAGNOSIS — M47816 Spondylosis without myelopathy or radiculopathy, lumbar region: Secondary | ICD-10-CM | POA: Diagnosis not present

## 2014-12-02 DIAGNOSIS — M545 Low back pain: Secondary | ICD-10-CM | POA: Insufficient documentation

## 2014-12-02 DIAGNOSIS — S3992XA Unspecified injury of lower back, initial encounter: Secondary | ICD-10-CM | POA: Diagnosis not present

## 2014-12-02 DIAGNOSIS — M533 Sacrococcygeal disorders, not elsewhere classified: Secondary | ICD-10-CM | POA: Diagnosis not present

## 2014-12-02 DIAGNOSIS — W19XXXA Unspecified fall, initial encounter: Secondary | ICD-10-CM | POA: Insufficient documentation

## 2014-12-06 DIAGNOSIS — Z9089 Acquired absence of other organs: Secondary | ICD-10-CM | POA: Diagnosis not present

## 2014-12-06 DIAGNOSIS — Z483 Aftercare following surgery for neoplasm: Secondary | ICD-10-CM | POA: Diagnosis not present

## 2014-12-06 DIAGNOSIS — Z905 Acquired absence of kidney: Secondary | ICD-10-CM | POA: Diagnosis not present

## 2014-12-06 DIAGNOSIS — R918 Other nonspecific abnormal finding of lung field: Secondary | ICD-10-CM | POA: Diagnosis not present

## 2014-12-06 DIAGNOSIS — C228 Malignant neoplasm of liver, primary, unspecified as to type: Secondary | ICD-10-CM | POA: Diagnosis not present

## 2014-12-21 DIAGNOSIS — C228 Malignant neoplasm of liver, primary, unspecified as to type: Secondary | ICD-10-CM | POA: Diagnosis not present

## 2014-12-23 DIAGNOSIS — C228 Malignant neoplasm of liver, primary, unspecified as to type: Secondary | ICD-10-CM | POA: Diagnosis not present

## 2014-12-23 DIAGNOSIS — C220 Liver cell carcinoma: Secondary | ICD-10-CM | POA: Diagnosis not present

## 2014-12-27 DIAGNOSIS — H2513 Age-related nuclear cataract, bilateral: Secondary | ICD-10-CM | POA: Diagnosis not present

## 2014-12-27 DIAGNOSIS — H524 Presbyopia: Secondary | ICD-10-CM | POA: Diagnosis not present

## 2015-01-04 ENCOUNTER — Encounter: Payer: Self-pay | Admitting: Neurology

## 2015-01-04 ENCOUNTER — Ambulatory Visit (INDEPENDENT_AMBULATORY_CARE_PROVIDER_SITE_OTHER): Payer: Medicare Other | Admitting: Neurology

## 2015-01-04 VITALS — BP 102/60 | HR 70 | Resp 16 | Ht 68.0 in | Wt 154.6 lb

## 2015-01-04 DIAGNOSIS — Z85528 Personal history of other malignant neoplasm of kidney: Secondary | ICD-10-CM | POA: Diagnosis not present

## 2015-01-04 DIAGNOSIS — R2681 Unsteadiness on feet: Secondary | ICD-10-CM

## 2015-01-04 NOTE — Patient Instructions (Addendum)
INCREASE WATER INTAKE We will get MRI of brain with and without contrast [facility] 01/13/15 12:45pm  Bun and Creatine today  2nd floor  Follow up afterwards

## 2015-01-04 NOTE — Progress Notes (Signed)
NEUROLOGY FOLLOW UP OFFICE NOTE  Corey Davila [id number]  HISTORY OF PRESENT ILLNESS: Corey Davila is an 81 year old right-handed man with history of liver cancer status post partial resection (75%) in 2008 and 2012, hyperlipidemia, BPH, neck pain and hearing loss who follows up today for a new problem, feeling off-balance. He is accompanied by his wife and daughter who provide history.  Records, CXR, X-ray of lumbar spine and sacrum, and reports of CTA/P reviewed.   He had a fall in March, which brought him to the hospital.  He was pruning a tree.  While looking straight up, he fell backwards.  He hit the back of his head on the grass.  He felt a little groggy afterwards but he did not lose consciousness or exhibit confusion.  CXR and thoracic films looking for fractures revealed nodular opacity in the basilar region.  CT Chest, Abdomen and Pelvis revealed lung nodules, which are stable and are watched by his oncologist.  In May, he was looking up at the tree again and tipped backwards again.  Lumbar films from May revealed multilevel degenerative disc disease.  Since that fall, he has felt off-balance.  He denies headache, nausea, dizziness, vertigo, lightheadedness, weakness in legs, pain in neck, numbness in feet.  He has localized back pain but no radicular symptoms.  He denies change in bowel or bladder function.  He denies slurred speech or new focal weakness.  When he is sitting up, he needs to lean back on something or he will just tip backwards.  He has been using a rolling walker.   He also  Has not been keeping hydrated.  PAST MEDICAL HISTORY: Past Medical History  Diagnosis Date  . Liver cancer     partial liver resection 06/18/2007; partial liver resection 08/29/2010, not renal  . History of kidney cancer 06/19/1979    L renal taken out   . Mixed hyperlipidemia   . Cough   . Arthritis   . Wears hearing aid     both ears    MEDICATIONS: Current Outpatient Prescriptions on  File Prior to Visit  Medication Sig Dispense Refill  . aspirin 81 MG tablet Take 81 mg by mouth daily.    . Calcium Citrate 200 MG TABS Take 1 tablet by mouth daily.    . Calcium-Vit D3-Phytosterols [id number] MG-UNIT-MG TABS Take 1 tablet by mouth daily.    . CRESTOR 20 MG tablet Take 1 tablet by mouth every other day.    . desmopressin (DDAVP) 0.2 MG tablet Take 0.5 tablets by mouth at bedtime.    . docusate sodium (COLACE) 100 MG capsule Take 100 mg by mouth daily.    . finasteride (PROSCAR) 5 MG tablet Take 1 tablet by mouth daily.    . Glucosamine-Chondroitin 250-200 MG TABS Take 1 tablet by mouth daily. Regular strength tabs.    . Misc Natural Products (OSTEO BI-FLEX JOINT SHIELD PO) Take 1 tablet by mouth 2 (two) times daily.    . Multiple Minerals-Vitamins (CALCIUM CITRATE PLUS/MAGNESIUM PO) Take 1 tablet by mouth daily.    . Multiple Vitamin (MULTIVITAMIN) tablet Take 1 tablet by mouth daily.    . vitamin B-12 (CYANOCOBALAMIN) 250 MCG tablet Take 250 mcg by mouth daily.    Marland Kitchen LYCOPENE PO Take 1 tablet by mouth daily.    . methylPREDNIsolone (MEDROL DOSPACK) 4 MG tablet follow package directions    . Milk Thistle 250 MG CAPS Take 1 capsule by mouth daily.    Marland Kitchen  predniSONE (DELTASONE) 5 MG tablet Take 5 mg by mouth.    . traMADol (ULTRAM) 50 MG tablet Take 1 tablet (50 mg total) by mouth every 6 (six) hours as needed. (Patient not taking: Reported on 01/04/2015) 30 tablet 0   No current facility-administered medications on file prior to visit.    ALLERGIES: Allergies  Allergen Reactions  . Tessalon [Benzonatate]   . Tussicaps [Hydrocod Polst-Cpm Polst Er]   . Bee Venom   . Dilantin [Phenytoin]   . Mevacor [Lovastatin]   . Niacin And Related     Rash   . Septra Ds [Sulfamethoxazole-Trimethoprim]     FAMILY HISTORY: Family History  Problem Relation Age of Onset  . Stroke Father     SOCIAL HISTORY: History   Social History  . Marital Status: Married    Spouse Name:  N/A  . Number of Children: N/A  . Years of Education: N/A   Occupational History  . Retired     Engineer, building services for [employer] Topics  . Smoking status: Never Smoker   . Smokeless tobacco: Never Used  . Alcohol Use: No  . Drug Use: No  . Sexual Activity: No   Other Topics Concern  . Not on file   Social History Narrative    REVIEW OF SYSTEMS: Constitutional: No fevers, chills, or sweats, no generalized fatigue, change in appetite Eyes: No visual changes, double vision, eye pain Ear, nose and throat: No hearing loss, ear pain, nasal congestion, sore throat Cardiovascular: No chest pain, palpitations Respiratory:  No shortness of breath at rest or with exertion, wheezes GastrointestinaI: No nausea, vomiting, diarrhea, abdominal pain, fecal incontinence Genitourinary:  No dysuria, urinary retention or frequency Musculoskeletal:  No neck pain, back pain Integumentary: No rash, pruritus, skin lesions Neurological: as above Psychiatric: No depression, insomnia, anxiety Endocrine: No palpitations, fatigue, diaphoresis, mood swings, change in appetite, change in weight, increased thirst Hematologic/Lymphatic:  No anemia, purpura, petechiae. Allergic/Immunologic: no itchy/runny eyes, nasal congestion, recent allergic reactions, rashes  PHYSICAL EXAM: Filed Vitals:   01/04/15 0926  BP: 102/60  Pulse: 70  Resp: 16  Orthostatics are negative General: No acute distress Head:  Normocephalic/atraumatic Eyes:  Fundoscopic exam unremarkable without vessel changes, exudates, hemorrhages or papilledema. Neck: supple, no paraspinal tenderness, full range of motion Heart:  Regular rate and rhythm Lungs:  Clear to auscultation bilaterally Back: No paraspinal tenderness Neurological Exam: alert and oriented to person, place, and time. Attention span and concentration intact, recent and remote memory intact, fund of knowledge intact.  Speech fluent and not dysarthric,  language intact.  CN II-XII intact. Fundoscopic exam unremarkable without vessel changes, exudates, hemorrhages or papilledema.  Bulk and tone normal, muscle strength 5/5 throughout.  Sensation to light touch, temperature and vibration intact.  Deep tendon reflexes 2+ throughout, toes downgoing.  Finger to nose and heel to shin testing intact.  Gait with overall normal stride but he does occasionally stagger, particularly when turning around.  He is unable to tandem walk, Romberg with sway.  IMPRESSION: Gait instability.  Differential diagnosis may be dehydration, post-concussive symptoms or intracranial abnormality.  PLAN: 1.  With history of hitting his head after the fall with no head imaging, as well as history of cancer, I recommend MRI of brain with and without contrast. 2.  Continue PT and use of rolling walker 3.  Increase fluid intake 4.  Follow up afterwards.  Metta Clines, DO  CC:  Antony Contras, MD

## 2015-01-11 DIAGNOSIS — M4696 Unspecified inflammatory spondylopathy, lumbar region: Secondary | ICD-10-CM | POA: Diagnosis not present

## 2015-01-13 ENCOUNTER — Ambulatory Visit (HOSPITAL_COMMUNITY): Payer: Private Health Insurance - Indemnity

## 2015-01-14 ENCOUNTER — Ambulatory Visit (HOSPITAL_BASED_OUTPATIENT_CLINIC_OR_DEPARTMENT_OTHER)
Admission: RE | Admit: 2015-01-14 | Discharge: 2015-01-14 | Disposition: A | Discharge status: Home / Self Care | Payer: Medicare Other | Source: Ambulatory Visit | Attending: Neurology | Admitting: Neurology

## 2015-01-14 ENCOUNTER — Other Ambulatory Visit: Payer: Self-pay | Admitting: Neurology

## 2015-01-14 DIAGNOSIS — R2681 Unsteadiness on feet: Secondary | ICD-10-CM | POA: Diagnosis present

## 2015-01-14 DIAGNOSIS — R930 Abnormal findings on diagnostic imaging of skull and head, not elsewhere classified: Secondary | ICD-10-CM | POA: Insufficient documentation

## 2015-01-14 DIAGNOSIS — Z85528 Personal history of other malignant neoplasm of kidney: Secondary | ICD-10-CM

## 2015-01-14 DIAGNOSIS — R2689 Other abnormalities of gait and mobility: Secondary | ICD-10-CM | POA: Diagnosis not present

## 2015-01-17 ENCOUNTER — Telehealth: Payer: Self-pay | Admitting: Neurology

## 2015-01-17 NOTE — Telephone Encounter (Signed)
I spoke with Corey Davila, his wife and his daughter on the phone to discuss the MRI results.  It shows some ventriculomegaly, however I believe it is an incidental finding.  His gait and balance problems are not typical for NPH.  He does not exhibit the typical magnetic gait.  He does not exhibit incontinence.  He is not demented.  I think the balance issues is more likely related to cerebrovascular disease.  I recommend continuing the ASA 81mg  daily and exercises from PT.  I answered all questions to the best of my ability.

## 2015-01-24 ENCOUNTER — Other Ambulatory Visit (HOSPITAL_BASED_OUTPATIENT_CLINIC_OR_DEPARTMENT_OTHER): Payer: Self-pay | Admitting: Sports Medicine

## 2015-01-24 DIAGNOSIS — M4696 Unspecified inflammatory spondylopathy, lumbar region: Secondary | ICD-10-CM | POA: Diagnosis not present

## 2015-01-24 DIAGNOSIS — M47816 Spondylosis without myelopathy or radiculopathy, lumbar region: Secondary | ICD-10-CM

## 2015-01-28 ENCOUNTER — Ambulatory Visit (HOSPITAL_BASED_OUTPATIENT_CLINIC_OR_DEPARTMENT_OTHER)
Admission: RE | Admit: 2015-01-28 | Discharge: 2015-01-28 | Disposition: A | Discharge status: Home / Self Care | Payer: Medicare Other | Source: Ambulatory Visit | Attending: Sports Medicine | Admitting: Sports Medicine

## 2015-01-28 DIAGNOSIS — M2578 Osteophyte, vertebrae: Secondary | ICD-10-CM | POA: Insufficient documentation

## 2015-01-28 DIAGNOSIS — M545 Low back pain: Secondary | ICD-10-CM | POA: Diagnosis present

## 2015-01-28 DIAGNOSIS — M9973 Connective tissue and disc stenosis of intervertebral foramina of lumbar region: Secondary | ICD-10-CM | POA: Diagnosis not present

## 2015-01-28 DIAGNOSIS — M5126 Other intervertebral disc displacement, lumbar region: Secondary | ICD-10-CM | POA: Diagnosis not present

## 2015-01-28 DIAGNOSIS — M47816 Spondylosis without myelopathy or radiculopathy, lumbar region: Secondary | ICD-10-CM | POA: Diagnosis not present

## 2015-01-28 DIAGNOSIS — M4806 Spinal stenosis, lumbar region: Secondary | ICD-10-CM | POA: Insufficient documentation

## 2015-01-31 DIAGNOSIS — M4696 Unspecified inflammatory spondylopathy, lumbar region: Secondary | ICD-10-CM | POA: Diagnosis not present

## 2015-02-13 ENCOUNTER — Emergency Department (HOSPITAL_BASED_OUTPATIENT_CLINIC_OR_DEPARTMENT_OTHER): Payer: Medicare Other

## 2015-02-13 ENCOUNTER — Encounter (HOSPITAL_BASED_OUTPATIENT_CLINIC_OR_DEPARTMENT_OTHER): Payer: Self-pay | Admitting: *Deleted

## 2015-02-13 ENCOUNTER — Inpatient Hospital Stay (HOSPITAL_BASED_OUTPATIENT_CLINIC_OR_DEPARTMENT_OTHER)
Admission: EM | Admit: 2015-02-13 | Discharge: 2015-02-16 | DRG: 641 | Disposition: A | Discharge status: Home / Self Care | Payer: Medicare Other | Attending: Internal Medicine | Admitting: Internal Medicine

## 2015-02-13 DIAGNOSIS — Z905 Acquired absence of kidney: Secondary | ICD-10-CM | POA: Diagnosis present

## 2015-02-13 DIAGNOSIS — R51 Headache: Secondary | ICD-10-CM | POA: Diagnosis not present

## 2015-02-13 DIAGNOSIS — Z91038 Other insect allergy status: Secondary | ICD-10-CM | POA: Diagnosis not present

## 2015-02-13 DIAGNOSIS — Z888 Allergy status to other drugs, medicaments and biological substances status: Secondary | ICD-10-CM | POA: Diagnosis not present

## 2015-02-13 DIAGNOSIS — E871 Hypo-osmolality and hyponatremia: Secondary | ICD-10-CM | POA: Diagnosis not present

## 2015-02-13 DIAGNOSIS — T38895A Adverse effect of other hormones and synthetic substitutes, initial encounter: Secondary | ICD-10-CM | POA: Diagnosis present

## 2015-02-13 DIAGNOSIS — Z885 Allergy status to narcotic agent status: Secondary | ICD-10-CM | POA: Diagnosis not present

## 2015-02-13 DIAGNOSIS — N3944 Nocturnal enuresis: Secondary | ICD-10-CM | POA: Diagnosis present

## 2015-02-13 DIAGNOSIS — Y92002 Bathroom of unspecified non-institutional (private) residence single-family (private) house as the place of occurrence of the external cause: Secondary | ICD-10-CM

## 2015-02-13 DIAGNOSIS — R918 Other nonspecific abnormal finding of lung field: Secondary | ICD-10-CM | POA: Diagnosis present

## 2015-02-13 DIAGNOSIS — C228 Malignant neoplasm of liver, primary, unspecified as to type: Secondary | ICD-10-CM | POA: Diagnosis not present

## 2015-02-13 DIAGNOSIS — C220 Liver cell carcinoma: Secondary | ICD-10-CM | POA: Diagnosis present

## 2015-02-13 DIAGNOSIS — S22089A Unspecified fracture of T11-T12 vertebra, initial encounter for closed fracture: Secondary | ICD-10-CM | POA: Diagnosis present

## 2015-02-13 DIAGNOSIS — Z85528 Personal history of other malignant neoplasm of kidney: Secondary | ICD-10-CM | POA: Diagnosis not present

## 2015-02-13 DIAGNOSIS — W1830XA Fall on same level, unspecified, initial encounter: Secondary | ICD-10-CM | POA: Diagnosis present

## 2015-02-13 DIAGNOSIS — S0990XA Unspecified injury of head, initial encounter: Secondary | ICD-10-CM | POA: Diagnosis not present

## 2015-02-13 DIAGNOSIS — Z7982 Long term (current) use of aspirin: Secondary | ICD-10-CM

## 2015-02-13 DIAGNOSIS — N390 Urinary tract infection, site not specified: Secondary | ICD-10-CM | POA: Diagnosis present

## 2015-02-13 DIAGNOSIS — E782 Mixed hyperlipidemia: Secondary | ICD-10-CM | POA: Diagnosis present

## 2015-02-13 DIAGNOSIS — W19XXXD Unspecified fall, subsequent encounter: Secondary | ICD-10-CM | POA: Diagnosis not present

## 2015-02-13 DIAGNOSIS — S20412A Abrasion of left back wall of thorax, initial encounter: Secondary | ICD-10-CM | POA: Diagnosis not present

## 2015-02-13 DIAGNOSIS — Z79899 Other long term (current) drug therapy: Secondary | ICD-10-CM | POA: Diagnosis not present

## 2015-02-13 DIAGNOSIS — M1990 Unspecified osteoarthritis, unspecified site: Secondary | ICD-10-CM | POA: Diagnosis present

## 2015-02-13 DIAGNOSIS — S299XXA Unspecified injury of thorax, initial encounter: Secondary | ICD-10-CM | POA: Diagnosis not present

## 2015-02-13 DIAGNOSIS — Z91030 Bee allergy status: Secondary | ICD-10-CM

## 2015-02-13 DIAGNOSIS — T148XXA Other injury of unspecified body region, initial encounter: Secondary | ICD-10-CM

## 2015-02-13 DIAGNOSIS — Z9889 Other specified postprocedural states: Secondary | ICD-10-CM

## 2015-02-13 DIAGNOSIS — W19XXXA Unspecified fall, initial encounter: Secondary | ICD-10-CM | POA: Diagnosis not present

## 2015-02-13 DIAGNOSIS — Z882 Allergy status to sulfonamides status: Secondary | ICD-10-CM

## 2015-02-13 DIAGNOSIS — B958 Unspecified staphylococcus as the cause of diseases classified elsewhere: Secondary | ICD-10-CM | POA: Diagnosis present

## 2015-02-13 DIAGNOSIS — S20222A Contusion of left back wall of thorax, initial encounter: Secondary | ICD-10-CM | POA: Diagnosis not present

## 2015-02-13 DIAGNOSIS — R0781 Pleurodynia: Secondary | ICD-10-CM | POA: Diagnosis not present

## 2015-02-13 DIAGNOSIS — S22080A Wedge compression fracture of T11-T12 vertebra, initial encounter for closed fracture: Secondary | ICD-10-CM

## 2015-02-13 DIAGNOSIS — Z880 Allergy status to penicillin: Secondary | ICD-10-CM

## 2015-02-13 DIAGNOSIS — S22080S Wedge compression fracture of T11-T12 vertebra, sequela: Secondary | ICD-10-CM | POA: Diagnosis not present

## 2015-02-13 HISTORY — DX: Other chronic pain: G89.29

## 2015-02-13 HISTORY — DX: Malignant neoplasm of unspecified kidney, except renal pelvis: C64.9

## 2015-02-13 HISTORY — DX: Pneumonia, unspecified organism: J18.9

## 2015-02-13 HISTORY — DX: Unspecified hearing loss, bilateral: H91.93

## 2015-02-13 HISTORY — DX: Sleep apnea, unspecified: G47.30

## 2015-02-13 HISTORY — DX: Low back pain: M54.5

## 2015-02-13 LAB — I-STAT CHEM 8, ED
BUN: 14 mg/dL (ref 6–20)
CALCIUM ION: 1.15 mmol/L (ref 1.13–1.30)
CREATININE: 0.8 mg/dL (ref 0.61–1.24)
Chloride: 92 mmol/L — ABNORMAL LOW (ref 101–111)
Glucose, Bld: 109 mg/dL — ABNORMAL HIGH (ref 65–99)
HCT: 43 % (ref 39.0–52.0)
Hemoglobin: 14.6 g/dL (ref 13.0–17.0)
Potassium: 4.4 mmol/L (ref 3.5–5.1)
SODIUM: 122 mmol/L — AB (ref 135–145)
TCO2: 20 mmol/L (ref 0–100)

## 2015-02-13 LAB — CBC WITH DIFFERENTIAL/PLATELET
Basophils Absolute: 0 10*3/uL (ref 0.0–0.1)
Basophils Relative: 0 % (ref 0–1)
EOS PCT: 1 % (ref 0–5)
Eosinophils Absolute: 0.1 10*3/uL (ref 0.0–0.7)
HEMATOCRIT: 37.6 % — AB (ref 39.0–52.0)
Hemoglobin: 13.1 g/dL (ref 13.0–17.0)
Lymphocytes Relative: 10 % — ABNORMAL LOW (ref 12–46)
Lymphs Abs: 1 10*3/uL (ref 0.7–4.0)
MCH: 32.9 pg (ref 26.0–34.0)
MCHC: 34.8 g/dL (ref 30.0–36.0)
MCV: 94.5 fL (ref 78.0–100.0)
Monocytes Absolute: 1.1 10*3/uL — ABNORMAL HIGH (ref 0.1–1.0)
Monocytes Relative: 11 % (ref 3–12)
Neutro Abs: 7.3 10*3/uL (ref 1.7–7.7)
Neutrophils Relative %: 78 % — ABNORMAL HIGH (ref 43–77)
Platelets: 207 10*3/uL (ref 150–400)
RBC: 3.98 MIL/uL — ABNORMAL LOW (ref 4.22–5.81)
RDW: 11.9 % (ref 11.5–15.5)
WBC: 9.4 10*3/uL (ref 4.0–10.5)

## 2015-02-13 LAB — URINE MICROSCOPIC-ADD ON

## 2015-02-13 LAB — URINALYSIS, ROUTINE W REFLEX MICROSCOPIC
BILIRUBIN URINE: NEGATIVE
Glucose, UA: NEGATIVE mg/dL
KETONES UR: NEGATIVE mg/dL
NITRITE: POSITIVE — AB
PROTEIN: 30 mg/dL — AB
Specific Gravity, Urine: 1.013 (ref 1.005–1.030)
Urobilinogen, UA: 0.2 mg/dL (ref 0.0–1.0)
pH: 6 (ref 5.0–8.0)

## 2015-02-13 LAB — URIC ACID: URIC ACID, SERUM: 3 mg/dL — AB (ref 4.4–7.6)

## 2015-02-13 LAB — TSH: TSH: 3.861 u[IU]/mL (ref 0.350–4.500)

## 2015-02-13 LAB — SODIUM, URINE, RANDOM: SODIUM UR: 29 mmol/L

## 2015-02-13 MED ORDER — ENOXAPARIN SODIUM 40 MG/0.4ML ~~LOC~~ SOLN
40.0000 mg | Freq: Every day | SUBCUTANEOUS | Status: DC
  Administered 2015-02-13 – 2015-02-16 (×4): 40 mg via SUBCUTANEOUS
  Filled 2015-02-13 (×4): qty 0.4

## 2015-02-13 MED ORDER — FINASTERIDE 5 MG PO TABS
5.0000 mg | ORAL_TABLET | Freq: Every day | ORAL | Status: DC
Start: 2015-02-13 — End: 2015-02-16
  Administered 2015-02-13 – 2015-02-16 (×4): 5 mg via ORAL
  Filled 2015-02-13 (×4): qty 1

## 2015-02-13 MED ORDER — CALCIUM CARBONATE 1250 (500 CA) MG PO TABS
1.0000 | ORAL_TABLET | Freq: Every day | ORAL | Status: DC
  Administered 2015-02-14 – 2015-02-16 (×3): 500 mg via ORAL
  Filled 2015-02-13 (×4): qty 1

## 2015-02-13 MED ORDER — TRAMADOL-ACETAMINOPHEN 37.5-325 MG PO TABS
2.0000 | ORAL_TABLET | ORAL | Status: DC | PRN
  Administered 2015-02-13: 2 via ORAL
  Filled 2015-02-13: qty 2

## 2015-02-13 MED ORDER — METHOCARBAMOL 500 MG PO TABS
500.0000 mg | ORAL_TABLET | Freq: Four times a day (QID) | ORAL | Status: DC | PRN
  Administered 2015-02-13 – 2015-02-14 (×2): 500 mg via ORAL
  Filled 2015-02-13 (×2): qty 1

## 2015-02-13 MED ORDER — ONDANSETRON HCL 4 MG/2ML IJ SOLN
INTRAMUSCULAR | Status: AC
  Filled 2015-02-13: qty 2

## 2015-02-13 MED ORDER — ASPIRIN EC 81 MG PO TBEC
81.0000 mg | DELAYED_RELEASE_TABLET | Freq: Every day | ORAL | Status: DC
  Administered 2015-02-13 – 2015-02-16 (×4): 81 mg via ORAL
  Filled 2015-02-13 (×4): qty 1

## 2015-02-13 MED ORDER — ACETAMINOPHEN 650 MG RE SUPP: 650.0000 mg | Freq: Four times a day (QID) | RECTAL | Status: DC | PRN

## 2015-02-13 MED ORDER — ACETAMINOPHEN 325 MG PO TABS: 650.0000 mg | ORAL_TABLET | Freq: Four times a day (QID) | ORAL | Status: DC | PRN

## 2015-02-13 MED ORDER — CEFTRIAXONE SODIUM 1 G IJ SOLR
INTRAMUSCULAR | Status: AC
  Filled 2015-02-13: qty 10

## 2015-02-13 MED ORDER — KETOROLAC TROMETHAMINE 30 MG/ML IJ SOLN: 30.0000 mg | Freq: Four times a day (QID) | INTRAMUSCULAR | Status: DC | PRN

## 2015-02-13 MED ORDER — DESMOPRESSIN ACETATE 0.1 MG PO TABS
100.0000 ug | ORAL_TABLET | Freq: Every day | ORAL | Status: DC
  Administered 2015-02-13: 100 ug via ORAL
  Filled 2015-02-13 (×2): qty 1

## 2015-02-13 MED ORDER — ONDANSETRON HCL 4 MG PO TABS: 4.0000 mg | ORAL_TABLET | Freq: Four times a day (QID) | ORAL | Status: DC | PRN

## 2015-02-13 MED ORDER — ONDANSETRON HCL 4 MG/2ML IJ SOLN
4.0000 mg | Freq: Once | INTRAMUSCULAR | Status: AC
  Administered 2015-02-13: 4 mg via INTRAVENOUS

## 2015-02-13 MED ORDER — TRAMADOL HCL 50 MG PO TABS
50.0000 mg | ORAL_TABLET | Freq: Once | ORAL | Status: AC
  Administered 2015-02-13: 50 mg via ORAL
  Filled 2015-02-13: qty 1

## 2015-02-13 MED ORDER — MORPHINE SULFATE 2 MG/ML IJ SOLN: 1.0000 mg | INTRAMUSCULAR | Status: DC | PRN

## 2015-02-13 MED ORDER — FENTANYL CITRATE (PF) 100 MCG/2ML IJ SOLN
25.0000 ug | Freq: Once | INTRAMUSCULAR | Status: AC
  Administered 2015-02-13: 25 ug via INTRAVENOUS

## 2015-02-13 MED ORDER — SODIUM CHLORIDE 0.9 % IV BOLUS (SEPSIS)
500.0000 mL | Freq: Once | INTRAVENOUS | Status: AC
  Administered 2015-02-13: 500 mL via INTRAVENOUS

## 2015-02-13 MED ORDER — FENTANYL CITRATE (PF) 100 MCG/2ML IJ SOLN
INTRAMUSCULAR | Status: AC
  Filled 2015-02-13: qty 2

## 2015-02-13 MED ORDER — IOHEXOL 300 MG/ML  SOLN
100.0000 mL | Freq: Once | INTRAMUSCULAR | Status: AC | PRN
  Administered 2015-02-13: 100 mL via INTRAVENOUS

## 2015-02-13 MED ORDER — GLUCOSAMINE-CHONDROITIN 250-200 MG PO TABS: 1.0000 | ORAL_TABLET | Freq: Every day | ORAL | Status: DC

## 2015-02-13 MED ORDER — SODIUM CHLORIDE 0.9 % IJ SOLN
3.0000 mL | Freq: Two times a day (BID) | INTRAMUSCULAR | Status: DC
  Administered 2015-02-13 – 2015-02-16 (×3): 3 mL via INTRAVENOUS

## 2015-02-13 MED ORDER — CYANOCOBALAMIN 250 MCG PO TABS
250.0000 ug | ORAL_TABLET | Freq: Every day | ORAL | Status: DC
  Administered 2015-02-14 – 2015-02-16 (×3): 250 ug via ORAL
  Filled 2015-02-13 (×4): qty 1

## 2015-02-13 MED ORDER — ONDANSETRON HCL 4 MG/2ML IJ SOLN
4.0000 mg | Freq: Four times a day (QID) | INTRAMUSCULAR | Status: DC | PRN
  Administered 2015-02-13 (×2): 4 mg via INTRAVENOUS
  Filled 2015-02-13 (×2): qty 2

## 2015-02-13 MED ORDER — ROSUVASTATIN CALCIUM 20 MG PO TABS
20.0000 mg | ORAL_TABLET | ORAL | Status: DC
  Administered 2015-02-13 – 2015-02-15 (×2): 20 mg via ORAL
  Filled 2015-02-13 (×2): qty 1

## 2015-02-13 MED ORDER — SODIUM CHLORIDE 0.9 % IV SOLN
Freq: Once | INTRAVENOUS | Status: AC
  Administered 2015-02-13: 06:00:00 via INTRAVENOUS

## 2015-02-13 MED ORDER — CALCIUM CITRATE 200 MG PO TABS: 1.0000 | ORAL_TABLET | Freq: Every day | ORAL | Status: DC

## 2015-02-13 MED ORDER — DEXTROSE 5 % IV SOLN
1.0000 g | Freq: Once | INTRAVENOUS | Status: AC
  Administered 2015-02-13: 1 g via INTRAVENOUS

## 2015-02-13 MED ORDER — DOCUSATE SODIUM 100 MG PO CAPS
100.0000 mg | ORAL_CAPSULE | Freq: Every day | ORAL | Status: DC
  Administered 2015-02-13 – 2015-02-16 (×4): 100 mg via ORAL
  Filled 2015-02-13 (×4): qty 1

## 2015-02-13 MED ORDER — CALCIUM CITRATE PLUS/MAGNESIUM PO TABS: 1.0000 | ORAL_TABLET | Freq: Every day | ORAL | Status: DC

## 2015-02-13 MED ORDER — TRAMADOL HCL 50 MG PO TABS: 50.0000 mg | ORAL_TABLET | Freq: Four times a day (QID) | ORAL | Status: DC | PRN

## 2015-02-13 NOTE — ED Notes (Signed)
Back from radiology, alert, NAD,calm, interactive.

## 2015-02-13 NOTE — ED Notes (Signed)
Report received and care assumed.  Awaiting admission bed.  Family at bedside.

## 2015-02-13 NOTE — ED Notes (Signed)
Report called to [facility].  ETA 30 minutes

## 2015-02-13 NOTE — ED Provider Notes (Signed)
CSN: [id number]     Arrival date & time 02/13/15  0212 History   First MD Initiated Contact with Patient 02/13/15 [phone]     Chief Complaint  Patient presents with  . Fall     (Consider location/radiation/quality/duration/timing/severity/associated sxs/prior Treatment) HPI  This is an 81 year old male with a history of liver cancer and renal cell carcinoma who presents following a fall. Patient reports that he got off balance and fell in his bathroom. He hit his back on the cabinet. He did not lose consciousness. He is not on any anticoagulation. He is reporting chest and back pain. Denies any shortness of breath. Patient denies any syncope. Patient and family report recent history of balance issues. He has been seen and evaluated by his primary physician as well as a neurologist. He had an MRI brain and recently had an MRI of the lumbar spine for persistent back pain. He was seen in March following a fall where he sustained rib fractures. He has been ambulatory since his fall tonight. Current pain is 9 out of 10.  Past Medical History  Diagnosis Date  . Liver cancer     partial liver resection 06/18/2007; partial liver resection 08/29/2010, not renal  . History of kidney cancer 06/19/1979    L renal taken out   . Mixed hyperlipidemia   . Cough   . Arthritis   . Wears hearing aid     both ears   Past Surgical History  Procedure Laterality Date  . Nephrectomy  1980    left, [place] -cancer  . Partial hepatectomy  06/18/07    [facility]  R lobe-cancer  . Parttial hepatectomy  08/29/2010    [facility]  L lobe  . Colonoscopy    . Carpal tunnel release Left 09/10/2013    Procedure: LEFT CARPAL TUNNEL RELEASE;  Surgeon: Cammie Sickle., MD;  Location: [facility];  Service: Orthopedics;  Laterality: Left;   Family History  Problem Relation Age of Onset  . Stroke Father    History  Substance Use Topics  . Smoking status: Never Smoker   . Smokeless tobacco: Never  Used  . Alcohol Use: No    Review of Systems  Constitutional: Negative.  Negative for fever.  Respiratory: Negative.  Negative for chest tightness and shortness of breath.   Cardiovascular: Positive for chest pain.  Gastrointestinal: Negative.  Negative for nausea, vomiting and abdominal pain.  Genitourinary: Negative.  Negative for dysuria.  Musculoskeletal: Positive for back pain and gait problem.  Skin: Positive for wound.  Neurological: Negative for dizziness, syncope and headaches.  All other systems reviewed and are negative.     Allergies  Tessalon; Tussicaps; Bee venom; Dilantin; Mevacor; Niacin and related; and Septra ds  Home Medications   Prior to Admission medications   Medication Sig Start Date End Date Taking? Authorizing Provider  aspirin 81 MG tablet Take 81 mg by mouth daily.    Historical Provider, MD  Calcium Citrate 200 MG TABS Take 1 tablet by mouth daily. 08/21/10   Historical Provider, MD  Calcium-Vit D3-Phytosterols [id number] MG-UNIT-MG TABS Take 1 tablet by mouth daily. 04/21/07   Historical Provider, MD  cefUROXime (CEFTIN) 500 MG tablet  11/02/14   Historical Provider, MD  CRESTOR 20 MG tablet Take 1 tablet by mouth every other day.    Historical Provider, MD  desmopressin (DDAVP) 0.2 MG tablet Take 0.5 tablets by mouth at bedtime.    Historical Provider, MD  docusate sodium (  COLACE) 100 MG capsule Take 100 mg by mouth daily. 08/21/10   Historical Provider, MD  finasteride (PROSCAR) 5 MG tablet Take 1 tablet by mouth daily.    Historical Provider, MD  Glucosamine-Chondroitin 250-200 MG TABS Take 1 tablet by mouth daily. Regular strength tabs. 04/21/07   Historical Provider, MD  LYCOPENE PO Take 1 tablet by mouth daily. 08/21/10   Historical Provider, MD  meclizine (ANTIVERT) 12.5 MG tablet  12/01/14   Historical Provider, MD  methylPREDNISolone (MEDROL DOSEPAK) 4 MG TBPK tablet follow package directions 09/29/14   Historical Provider, MD  methylPREDNIsolone  (MEDROL DOSPACK) 4 MG tablet follow package directions 09/29/14   Historical Provider, MD  Milk Thistle 250 MG CAPS Take 1 capsule by mouth daily.    Historical Provider, MD  Misc Natural Products (OSTEO BI-FLEX JOINT SHIELD PO) Take 1 tablet by mouth 2 (two) times daily.    Historical Provider, MD  Multiple Minerals-Vitamins (CALCIUM CITRATE PLUS/MAGNESIUM PO) Take 1 tablet by mouth daily.    Historical Provider, MD  Multiple Vitamin (MULTIVITAMIN) tablet Take 1 tablet by mouth daily.    Historical Provider, MD  predniSONE (DELTASONE) 5 MG tablet Take 5 mg by mouth. 04/01/14   Historical Provider, MD  traMADol (ULTRAM) 50 MG tablet Take 1 tablet (50 mg total) by mouth every 6 (six) hours as needed. Patient not taking: Reported on 01/04/2015 10/09/14   Jonetta Osgood, MD  vitamin B-12 (CYANOCOBALAMIN) 250 MCG tablet Take 250 mcg by mouth daily.    Historical Provider, MD   BP 144/72 mmHg  Pulse 74  Temp(Src) 98.1 F (36.7 C) (Oral)  Resp 20  Ht 5\' 9"  (1.753 m)  Wt 150 lb (68.04 kg)  BMI 22.14 kg/m2  SpO2 96% Physical Exam  Constitutional: He is oriented to person, place, and time. No distress.  HENT:  Head: Normocephalic and atraumatic.  Mouth/Throat: Oropharynx is clear and moist.  Eyes: EOM are normal. Pupils are equal, round, and reactive to light.  Neck: Normal range of motion. Neck supple.  No midline C-spine tenderness  Cardiovascular: Normal rate, regular rhythm and normal heart sounds.   No murmur heard. Pulmonary/Chest: Effort normal and breath sounds normal. No respiratory distress. He has no wheezes. He exhibits tenderness.  Tenderness palpation of the left lateral chest wall, no crepitus  Abdominal: Soft. Bowel sounds are normal. There is no tenderness. There is no rebound.  Musculoskeletal: He exhibits no edema.  Tenderness to palpation over the left thoracic back, no midline step-off or deformities  Neurological: He is alert and oriented to person, place, and time.   Cranial nerves II through XII intact, 5 out of 5 strength in all 4 extremities, gait not tested  Skin: Skin is warm and dry.  Abrasion and contusion noted over the left back  Psychiatric: He has a normal mood and affect.  Nursing note and vitals reviewed.   ED Course  Procedures (including critical care time)  CRITICAL CARE Performed by: Merryl Hacker   Total critical care time: 40 min  Critical care time was exclusive of separately billable procedures and treating other patients.  Critical care was necessary to treat or prevent imminent or life-threatening deterioration.  Critical care was time spent personally by me on the following activities: development of treatment plan with patient and/or surrogate as well as nursing, discussions with consultants, evaluation of patient's response to treatment, examination of patient, obtaining history from patient or surrogate, ordering and performing treatments and interventions, ordering and review  of laboratory studies, ordering and review of radiographic studies, pulse oximetry and re-evaluation of patient's condition.  Labs Review Labs Reviewed  URINALYSIS, ROUTINE W REFLEX MICROSCOPIC (NOT AT [facility]) - Abnormal; Notable for the following:    Hgb urine dipstick TRACE (*)    Protein, ur 30 (*)    Nitrite POSITIVE (*)    Leukocytes, UA SMALL (*)    All other components within normal limits  CBC WITH DIFFERENTIAL/PLATELET - Abnormal; Notable for the following:    RBC 3.98 (*)    HCT 37.6 (*)    Neutrophils Relative % 78 (*)    Lymphocytes Relative 10 (*)    Monocytes Absolute 1.1 (*)    All other components within normal limits  URINE MICROSCOPIC-ADD ON - Abnormal; Notable for the following:    Bacteria, UA FEW (*)    All other components within normal limits  I-STAT CHEM 8, ED - Abnormal; Notable for the following:    Sodium 122 (*)    Chloride 92 (*)    Glucose, Bld 109 (*)    All other components within normal limits   URINE CULTURE    Imaging Review Dg Ribs Unilateral W/chest Left  02/13/2015   CLINICAL DATA:  [place] after losing is balance, striking is posterior head on bathroom cabinets.  EXAM: LEFT RIBS AND CHEST - 3+ VIEW  COMPARISON:  10/08/2014  FINDINGS: There are old or subacute fractures of the left fifth through ninth ribs. No acute displaced fracture is evident. Mediastinal contours and normal. There is no pneumothorax. The lungs are clear.  IMPRESSION: No acute findings   Electronically Signed   By: Andreas Newport M.D.   On: 02/13/2015 03:58   Ct Head Wo Contrast  02/13/2015   CLINICAL DATA:  Lost balance in the bathroom and fell, striking the back of his head on cabinets  EXAM: CT HEAD WITHOUT CONTRAST  TECHNIQUE: Contiguous axial images were obtained from the base of the skull through the vertex without intravenous contrast.  COMPARISON:  03/30/2008  FINDINGS: There is prominent calcification of the interhemispheric falx. There also is mild calcification of the tentorium. No intracranial hemorrhage is evident. There is moderately severe generalized atrophy. There is mild white matter hypodensity consistent with chronic small vessel ischemic disease. The calvarium and skullbase are intact.  IMPRESSION: Negative for acute intracranial traumatic injury. Moderately severe generalized atrophy and chronic microvascular changes are present.   Electronically Signed   By: Andreas Newport M.D.   On: 02/13/2015 04:03   Ct Chest W Contrast  02/13/2015   CLINICAL DATA:  [place] in bathroom.  Left mid to upper back pain.  EXAM: CT CHEST WITH CONTRAST  TECHNIQUE: Multidetector CT imaging of the chest was performed during intravenous contrast administration.  CONTRAST:  180mL OMNIPAQUE IOHEXOL 300 MG/ML  SOLN  COMPARISON:  10/08/2014  FINDINGS: There is acute anterior compression fracture of T11 with approximately 50% loss of height anteriorly. There is no bony retropulsion into the central canal. The pedicles are intact.  Facet articulations are intact. No other acute fractures are evident. There are remote fractures of the left fifth through tenth ribs. Multiple pulmonary nodules are again evident without significant interval change from 10/08/2014. These are suspicious for hematogenous metastases. There is no adenopathy. There are no effusions. The airways are patent.  IMPRESSION: 1. Acute T11 compression fracture with approximately 50% loss of height anteriorly. No central canal compromise. Posterior elements are intact. 2. Multiple pulmonary nodules, suspicious for hematogenous metastases. Unchanged  from 10/08/2014.   Electronically Signed   By: Andreas Newport M.D.   On: 02/13/2015 06:31     EKG Interpretation   Date/Time:  Monday February 13 2015 06:23:08 EDT Ventricular Rate:  70 PR Interval:  204 QRS Duration: 102 QT Interval:  398 QTC Calculation: 429 R Axis:   78 Text Interpretation:  Normal sinus rhythm Normal ECG Confirmed by Bren Borys   MD, Loma Sousa ([id number]) on 02/13/2015 6:27:50 AM      MDM   Final diagnoses:  Fall, initial encounter  Contusion  Traumatic compression fracture of T11 thoracic vertebra, closed, initial encounter  Hyponatremia  UTI (lower urinary tract infection)     Patient presents following a reported mechanical fall. Contusion and abrasion noted over the left back and upper thorax.  Otherwise nontoxic on exam. Nonfocal. Recent history of balance issues and has had extensive outpatient workup. No evidence of head trauma. EKG reassuring. Basic labwork obtained. Initial chest x-ray without evidence of rib fractures, pneumothorax, hemothorax.  Lab work notable for sodium of 122. Sodium in March was normal. Patient was given IV fluids and started on a maintenance fluid of 75 mL an hour. Patient reports persistent pain. Pulse ox 91% on room air. For this reason and given hyponatremia and history of cancer, CT scan of the chest was obtained to rule out further trauma and evaluate for  pulmonary lesions that may be causing SIADH. CT chest negative for any new lesions or rib fractures. It does show a T11 compression fracture. This is likely acute; however, patient has had recent history of back pain. T11 was not imaged on recent MRI. Patient will need admission. Likely medicine admission for hyponatremia and UTI. Will also consult neurosurgery given acute compression fracture.  6:57 AM Discussed with Dr. Fabio Neighbors who is excepted to telemetry bed. Neurosurgery consultation pending.  Merryl Hacker, MD 02/13/15 [phone]

## 2015-02-13 NOTE — ED Notes (Signed)
Report called to 5W unit.

## 2015-02-13 NOTE — ED Provider Notes (Signed)
Per hospitalist request I discussed the patient's case with our [place] colleague, Dr. Arnoldo Morale.  On re-exam the patient is awake and alert.  Transfer pending.  Carmin Muskrat, MD 02/13/15 [phone]

## 2015-02-13 NOTE — Care Management Note (Signed)
Case Management Note  Patient Details  Name: Corey Davila MRN: [id number] Date of Birth: [birth year]  Subjective/Objective:   Patient lives with wife and daughter, he uses a cane and rolling walker at home.  His daughter will be his transportation at dc.  NCM will cont to follow for dc needs.                 Action/Plan:   Expected Discharge Date:                  Expected Discharge Plan:  [place]  In-House Referral:     Discharge planning Services  CM Consult  Post Acute Care Choice:    Choice offered to:     DME Arranged:    DME Agency:     HH Arranged:    [place] Agency:     Status of Service:  In process, will continue to follow  Medicare Important Message Given:    Date Medicare IM Given:    Medicare IM give by:    Date Additional Medicare IM Given:    Additional Medicare Important Message give by:     If discussed at [place] of Stay Meetings, dates discussed:    Additional Comments:  Zenon Mayo, RN 02/13/2015, 12:34 PM

## 2015-02-13 NOTE — ED Notes (Signed)
Pt states he lost his balance in the bathroom about 1 hour pta and fell hitting his head and back on the cabinets in his bathroom. Abrasion noted to left upper back. Denies loc. C/o lower to mid back pain. Pt was able to stand and ambulate. Denies any sob.

## 2015-02-13 NOTE — H&P (Signed)
[facility] History and Physical  Corey Davila UTM:[id number] DOB: Jan 02, 1924 DOA: 02/13/2015  Referring physician: Dr. Vanita Panda PCP: Gara Kroner, MD   Chief Complaint:  Fall at home  HPI:  81 year old male with history of hepatocellular carcinoma with partial resection in 2008 and 2012 (suspected to have recurrence lately but patient refused to have further aggressive treatment done and recommended for 3 monthly follow-up), dyslipidemia, low back pain with balance issue for the past 4 months who presented to [facility] after sustaining a fall in the bathroom during the night. He reports landing on his back and his left chest. Also reports that he may have hit his head did not lose consciousness. He had severe pain in his mid and low back and his left chest. Patient was admitted in March of this year after he sustained a fall. He reports that since then he has been having balance issues and for the past 2 months having low back pain. He is seeing an orthopedic surgeon at [facility] for this and had a recent MRI which showed multiple disc is in relation with spinal stenosis but no fracture or cord compression. Patient denies headache, dizziness, fever, chills, nausea , vomiting,  palpitations, SOB, abdominal pain, bowel or urinary symptoms. Denies change in weight or appetite.  Course in the ED Vitals were stable. Blood work was significant for hyponatremia with sodium of 122 and chloride of 92. Head CT was unremarkable. X-ray of the ribs were negative for any fracture. A chest CT with contrast was done which again showed little pulmonary nodules suspicious for hematogenous metastases which is unchanged from his imaging in March. Also showed an acute T11 compression fracture with approximate 50% loss of height with no central canal compromise. Neurosurgeon Dr. Arnoldo Morale was consulted by ED physician and patient admitted to hospitalist service on telemetry. UA was positive for UTIs  well.   Review of Systems:  Constitutional: Denies fever, chills, diaphoresis, appetite change and fatigue.  HEENT: Denies visual or hearing symptoms, congestion, difficulty swallowing, neck pain or stiffness Respiratory: Denies SOB, DOE, cough, chest tightness,  and wheezing.   Cardiovascular:  left rib pain, denies palpitations and leg swelling.  Gastrointestinal: Denies nausea, vomiting, abdominal pain, diarrhea, constipation, blood in stool and abdominal distention.  Genitourinary: Denies dysuria, hematuria, flank pain and difficulty urinating.  Endocrine: Denies: hot or cold intolerance,  polyuria, polydipsia. Musculoskeletal: Back pain, denied joint pain or swelling Skin: Denies pallor, rash and wound.  Neurological: Unsteady gait +, Denies dizziness, seizures, syncope, weakness, light-headedness, numbness and headaches.  Hematological: Denies adenopathy.  Psychiatric/Behavioral: Denies confusion  Past Medical History  Diagnosis Date  . Liver cancer     partial liver resection 06/18/2007; partial liver resection 08/29/2010, not renal  . History of kidney cancer 06/19/1979    L renal taken out   . Mixed hyperlipidemia   . Cough   . Arthritis   . Wears hearing aid     both ears   Past Surgical History  Procedure Laterality Date  . Nephrectomy  1980    left, [place] -cancer  . Partial hepatectomy  06/18/07    [facility]  R lobe-cancer  . Parttial hepatectomy  08/29/2010    [facility]  L lobe  . Colonoscopy    . Carpal tunnel release Left 09/10/2013    Procedure: LEFT CARPAL TUNNEL RELEASE;  Surgeon: Cammie Sickle., MD;  Location: [place];  Service: Orthopedics;  Laterality: Left;   Social  History:  reports that he has never smoked. He has never used smokeless tobacco. He reports that he does not drink alcohol or use illicit drugs.  Allergies  Allergen Reactions  . Tessalon [Benzonatate]   . Tussicaps [Hydrocod Polst-Cpm Polst Er]   . Amoxicillin  Other (See Comments)    NOT LISTED  . Bee Venom   . Dilantin [Phenytoin]   . Hornet Venom   . Hydrocodone-Chlorpheniramine   . Mevacor [Lovastatin]   . Niacin And Related     Rash   . Prednisone   . Septra Ds [Sulfamethoxazole-Trimethoprim]     Family History  Problem Relation Age of Onset  . Stroke Father     Prior to Admission medications   Medication Sig Start Date End Date Taking? Authorizing Provider  aspirin 81 MG tablet Take 81 mg by mouth daily.   Yes Historical Provider, MD  Calcium Citrate 200 MG TABS Take 1 tablet by mouth daily. 08/21/10  Yes Historical Provider, MD  Calcium-Vit D3-Phytosterols 366-440-347 MG-UNIT-MG TABS Take 1 tablet by mouth daily. 04/21/07  Yes Historical Provider, MD  CRESTOR 20 MG tablet Take 1 tablet by mouth every other day.   Yes Historical Provider, MD  desmopressin (DDAVP) 0.2 MG tablet Take 0.5 tablets by mouth at bedtime.   Yes Historical Provider, MD  docusate sodium (COLACE) 100 MG capsule Take 100 mg by mouth daily. 08/21/10  Yes Historical Provider, MD  finasteride (PROSCAR) 5 MG tablet Take 1 tablet by mouth daily.   Yes Historical Provider, MD  Glucosamine-Chondroitin 250-200 MG TABS Take 1 tablet by mouth daily. Regular strength tabs. 04/21/07  Yes Historical Provider, MD  LYCOPENE PO Take 1 tablet by mouth daily. 08/21/10  Yes Historical Provider, MD  meclizine (ANTIVERT) 12.5 MG tablet Take by mouth 3 (three) times daily as needed for dizziness.  12/01/14  Yes Historical Provider, MD  Misc Natural Products (OSTEO BI-FLEX JOINT SHIELD PO) Take 1 tablet by mouth 2 (two) times daily.   Yes Historical Provider, MD  Multiple Minerals-Vitamins (CALCIUM CITRATE PLUS/MAGNESIUM PO) Take 1 tablet by mouth daily.   Yes Historical Provider, MD  traMADol (ULTRAM) 50 MG tablet Take 1 tablet (50 mg total) by mouth every 6 (six) hours as needed. 10/09/14  Yes Shanker Kristeen Mans, MD  vitamin B-12 (CYANOCOBALAMIN) 250 MCG tablet Take 250 mcg by mouth daily.    Yes Historical Provider, MD     Physical Exam:  Filed Vitals:   02/13/15 0630 02/13/15 0711 02/13/15 0734 02/13/15 0900  BP: 156/77  125/66 117/63  Pulse: 71 66 66 70  Temp:    98 F (36.7 C)  TempSrc:    Oral  Resp:  16 16 20   Height:      Weight:      SpO2: 95% 99% 98% 96%    Constitutional: Vital signs reviewed.  Elderly male lying in bed in no acute distress  HEENT: no pallor, no icterus, moist oral mucosa, no cervical lymphadenopathy Cardiovascular: RRR, S1 normal, S2 normal, no MRG Chest: CTAB, no wheezes, rales, or rhonchi Abdominal: Soft. Non-tender, non-distended, bowel sounds are normal,  Ext: warm, no edema, and her to pressure over lower mid back, normal range of motion of his lower extremities  Neurological: A&O x3, non focal, normal bilateral lower extremity sensations  Labs on Admission:  Basic Metabolic Panel:  Recent Labs Lab 02/13/15 0517  NA 122*  K 4.4  CL 92*  GLUCOSE 109*  BUN 14  CREATININE 0.80  Liver Function Tests: No results for input(s): AST, ALT, ALKPHOS, BILITOT, PROT, ALBUMIN in the last 168 hours. No results for input(s): LIPASE, AMYLASE in the last 168 hours. No results for input(s): AMMONIA in the last 168 hours. CBC:  Recent Labs Lab 02/13/15 0506 02/13/15 0517  WBC 9.4  --   NEUTROABS 7.3  --   HGB 13.1 14.6  HCT 37.6* 43.0  MCV 94.5  --   PLT 207  --    Cardiac Enzymes: No results for input(s): CKTOTAL, CKMB, CKMBINDEX, TROPONINI in the last 168 hours. BNP: Invalid input(s): POCBNP CBG: No results for input(s): GLUCAP in the last 168 hours.  Radiological Exams on Admission: Dg Ribs Unilateral W/chest Left  02/13/2015   CLINICAL DATA:  [place] after losing is balance, striking is posterior head on bathroom cabinets.  EXAM: LEFT RIBS AND CHEST - 3+ VIEW  COMPARISON:  10/08/2014  FINDINGS: There are old or subacute fractures of the left fifth through ninth ribs. No acute displaced fracture is evident. Mediastinal  contours and normal. There is no pneumothorax. The lungs are clear.  IMPRESSION: No acute findings   Electronically Signed   By: Andreas Newport M.D.   On: 02/13/2015 03:58   Ct Head Wo Contrast  02/13/2015   CLINICAL DATA:  Lost balance in the bathroom and fell, striking the back of his head on cabinets  EXAM: CT HEAD WITHOUT CONTRAST  TECHNIQUE: Contiguous axial images were obtained from the base of the skull through the vertex without intravenous contrast.  COMPARISON:  03/30/2008  FINDINGS: There is prominent calcification of the interhemispheric falx. There also is mild calcification of the tentorium. No intracranial hemorrhage is evident. There is moderately severe generalized atrophy. There is mild white matter hypodensity consistent with chronic small vessel ischemic disease. The calvarium and skullbase are intact.  IMPRESSION: Negative for acute intracranial traumatic injury. Moderately severe generalized atrophy and chronic microvascular changes are present.   Electronically Signed   By: Andreas Newport M.D.   On: 02/13/2015 04:03   Ct Chest W Contrast  02/13/2015   CLINICAL DATA:  [place] in bathroom.  Left mid to upper back pain.  EXAM: CT CHEST WITH CONTRAST  TECHNIQUE: Multidetector CT imaging of the chest was performed during intravenous contrast administration.  CONTRAST:  11mL OMNIPAQUE IOHEXOL 300 MG/ML  SOLN  COMPARISON:  10/08/2014  FINDINGS: There is acute anterior compression fracture of T11 with approximately 50% loss of height anteriorly. There is no bony retropulsion into the central canal. The pedicles are intact. Facet articulations are intact. No other acute fractures are evident. There are remote fractures of the left fifth through tenth ribs. Multiple pulmonary nodules are again evident without significant interval change from 10/08/2014. These are suspicious for hematogenous metastases. There is no adenopathy. There are no effusions. The airways are patent.  IMPRESSION: 1. Acute  T11 compression fracture with approximately 50% loss of height anteriorly. No central canal compromise. Posterior elements are intact. 2. Multiple pulmonary nodules, suspicious for hematogenous metastases. Unchanged from 10/08/2014.   Electronically Signed   By: Andreas Newport M.D.   On: 02/13/2015 06:31    EKG: NSR@70  , no ST-T changes  Assessment/Plan Principal Problem:   Acute hyponatremia Differential includes SIADH with pulmonary metastases, versus prerenal associated with dehydration. -Check urine sodium and ask laterality. UA suggestive of UTI. Check TSH. -Patient on DDAVP which I will resume. Last sodium in the system was from 4 months back and was normal. Fluid restriction to 1200  mL per day. -Monitor on telemetry. Repeat sodium in a.m.  Active Problems:  Traumatic compression fracture of T11 thoracic vertebra seen on CT of the chest Secondary to fall. This is new since his MRI of the lumbar spine done 2 weeks back. On reviewing notes at [facility] patient has been following with orthopedic surgeon there for the past 2 months for ongoing low back pain without clear etiology. (Lumbar spine arthropathy with foraminal stenosis at various levels seen on MRI from July 2016) .He was being treated with prev pak. He has also been following with neurology as outpatient for balance issue since his fall in March of this year. -He does not have any neurological deficit, bowel or urinary symptoms at present. -I would place him on when necessary Ultracet and morphine for pain control. Physical therapy evaluation. Neurosurgery Dr. Arnoldo Morale was consulted by ED physician.   Possible recurrence of hepatocellular carcinoma Being followed at [facility]. As per his surgeon's note patient did not wish to undergo any invasive or aggressive workup. Has new pulmonary nodules bilaterally (seen during his last admission here in March). CT of the chest done today shows unchanged findings.      History of renal cell cancer Status post left nephrectomy in the 80s.    Mixed hyperlipidemia Continue statin     UTI (lower urinary tract infection) Check urine culture. Place on empiric Rocephin       Diet: Regular with fluid restrictions  DVT prophylaxis: sq lovenox   Code Status: full code Family Communication: None at bedside Disposition Plan: Continue inpatient monitoring.  Louellen Molder [facility] Pager [phone]  Total time spent on admission :70 minutes  If 7PM-7AM, please contact night-coverage [email or web address] Password [email or web address] 02/13/2015, 10:20 AM

## 2015-02-13 NOTE — Evaluation (Addendum)
Physical Therapy Evaluation Patient Details Name: Corey Davila MRN: [id number] DOB: July 23, 1923 Today's Date: 02/13/2015   History of Present Illness  81 year old male with history of hepatocellular carcinoma with partial resection in 2008 and 2012 (suspected to have recurrence lately but patient refused to have further aggressive treatment done and recommended for 3 monthly follow-up), dyslipidemia, low back pain with balance issue for the past 4 months who presented to [facility] after sustaining a fall in the bathroom during the night. He reports landing on his back and his left chest. Also reports that he may have hit his head did not lose consciousness. He had severe pain in his mid and low back and his left chest. Patient was admitted in March of this year after he sustained a fall. He reports that since then he has been having balance issues and for the past 2 months having low back pain. He is seeing an orthopedic surgeon at [facility] for this and had a recent MRI which showed multiple disc is in relation with spinal stenosis but no fracture or cord compression.  Now with new T11 compression fx  Clinical Impression  Pt presents with generalized weakness, decreased balance and decreased cognition (note that he has had many pain meds per family today).  Pt was able to sit at EOB and stand with PT, however then became nauseated.  RN made aware.  Assisted pt into recliner in order to eat crackers/drink ginger ale.  Spoke with family regarding D/C plan and feel that at this moment would require follow up at [facility] and 24/7 care, however that we would keep working with him and if he improves that D/C plan could change to HHPT.  Wife and daughter verbalized understanding.    Note that during session, had pt perform mobility on RA, SaO2 in upper 90's therefore re-applied 2LO2 before leaving room.      Follow Up Recommendations SNF;Supervision/Assistance - 24 hour (unless progresses)    Equipment  Recommendations  Rolling walker with 5" wheels    Recommendations for Other Services OT consult     Precautions / Restrictions Precautions Precautions: Fall;Back Precaution Comments: Does not formally have back precautions, but educated due to T11 compression fx Restrictions Weight Bearing Restrictions: No      Mobility  Bed Mobility Overal bed mobility: Needs Assistance Bed Mobility: Rolling;Sidelying to Sit Rolling: Min assist Sidelying to sit: Min assist       General bed mobility comments: Pt requires max tactile cues for following log rolling sequence with min A to roll and verbal cues for elevating to EOB as well as assist to elevate with cues to prevent twisting.   Transfers Overall transfer level: Needs assistance Equipment used: Rolling walker (2 wheeled) Transfers: Sit to/from Stand Sit to Stand: Mod assist         General transfer comment: Requires cues for hand placement and mod A for forward trunk lean to elevate into standing.    Ambulation/Gait Ambulation/Gait assistance: Mod assist;Min assist   Assistive device: Rolling walker (2 wheeled) Gait Pattern/deviations: Step-through pattern;Shuffle;Trunk flexed     General Gait Details: Pt took several small steps over to recliner with use of RW with max verbal cues for safe use of RW as well as to increase forward weight shift due to moderate to severe posterior lean.   Stairs            Wheelchair Mobility    Modified Rankin (Stroke Patients Only)  Balance Overall balance assessment: Needs assistance Sitting-balance support: Feet supported;Single extremity supported Sitting balance-Leahy Scale: Poor   Postural control: Posterior lean Standing balance support: During functional activity;Bilateral upper extremity supported Standing balance-Leahy Scale: Poor Standing balance comment: Pt requires mod A and use of BUEs on RW to maintain balance.  Note heavy posterior lean and when asked if  pt could tell, states "yes" but unable to correct during session.                              Pertinent Vitals/Pain Pain Assessment: 0-10 Pain Score: 7  Pain Location: low back Pain Descriptors / Indicators: Aching;Constant Pain Intervention(s): Limited activity within patient's tolerance;Monitored during session;Repositioned    Home Living Family/patient expects to be discharged to:: Private residence Living Arrangements: Spouse/significant other Available Help at Discharge: Family;Available 24 hours/day Type of Home: House Home Access: Stairs to enter Entrance Stairs-Rails: Can reach both;Right;Left Entrance Stairs-Number of Steps: 5 Home Layout: Two level Home Equipment: [place] - 4 wheels;Cane - single point      Prior Function Level of Independence: Independent with assistive device(s)         Comments: has been "off balance" since March per family     Hand Dominance        Extremity/Trunk Assessment               Lower Extremity Assessment: Generalized weakness      Cervical / Trunk Assessment: Kyphotic;Other exceptions  Communication   Communication: No difficulties  Cognition Arousal/Alertness: Lethargic Behavior During Therapy: WFL for tasks assessed/performed Overall Cognitive Status: Impaired/Different from baseline Area of Impairment: Orientation;Safety/judgement;Following commands;Awareness;Attention Orientation Level: Disoriented to;Place;Time Current Attention Level: Sustained Memory: Decreased recall of precautions;Decreased short-term memory Following Commands: Follows one step commands with increased time Safety/Judgement: Decreased awareness of safety;Decreased awareness of deficits Awareness: Intellectual        General Comments      Exercises        Assessment/Plan    PT Assessment Patient needs continued PT services  PT Diagnosis Difficulty walking;Generalized weakness;Acute pain   PT Problem List Decreased  strength;Decreased activity tolerance;Decreased balance;Decreased mobility;Decreased cognition;Decreased knowledge of use of DME;Decreased safety awareness;Decreased knowledge of precautions;Cardiopulmonary status limiting activity;Pain  PT Treatment Interventions DME instruction;Gait training;Stair training;Functional mobility training;Therapeutic activities;Therapeutic exercise;Balance training;Cognitive remediation;Patient/family education   PT Goals (Current goals can be found in the Care Plan section) Acute Rehab PT Goals Patient Stated Goal: none stated PT Goal Formulation: With patient/family Time For Goal Achievement: 02/20/15 Potential to Achieve Goals: Good    Frequency Min 4X/week   Barriers to discharge Decreased caregiver support      Co-evaluation               End of Session Equipment Utilized During Treatment: Gait belt Activity Tolerance: Patient limited by fatigue;Other (comment) (nausea) Patient left: in chair;with call bell/phone within reach;with nursing/sitter in room Nurse Communication: Mobility status;Precautions;Other (comment) (nausea)         Time: 6659-9357 PT Time Calculation (min) (ACUTE ONLY): 38 min   Charges:   PT Evaluation $Initial PT Evaluation Tier I: 1 Procedure PT Treatments $Therapeutic Activity: 23-37 mins   PT G Codes:        Denice Bors 02/13/2015, 4:18 PM

## 2015-02-13 NOTE — ED Notes (Signed)
[facility] at bedside 

## 2015-02-13 NOTE — ED Notes (Signed)
MD at bedside. 

## 2015-02-13 NOTE — ED Notes (Signed)
No changes, to CT via stretcher, alert, NAD, calm, interactive, no dyspnea noted, able to stand at [facility] with assistance to void, sample sent.

## 2015-02-13 NOTE — Progress Notes (Signed)
.  PHARMACIST - PHYSICIAN ORDER COMMUNICATION  CONCERNING: P&T Medication Policy on Herbal Medications  DESCRIPTION:  This patient's order for:  Glucosamine/chondroitin  has been noted.  This product(s) is classified as an "herbal" or natural product. Due to a lack of definitive safety studies or FDA approval, nonstandard manufacturing practices, plus the potential risk of unknown drug-drug interactions while on inpatient medications, the Pharmacy and Therapeutics Committee does not permit the use of "herbal" or natural products of this type within [facility].   ACTION TAKEN: The pharmacy department is unable to verify this order at this time and your patient has been informed of this safety policy. Please reevaluate patient's clinical condition at discharge and address if the herbal or natural product(s) should be resumed at that time.  Erin Hearing PharmD., BCPS Clinical Pharmacist Pager [phone] 02/13/2015 10:58 AM

## 2015-02-13 NOTE — ED Notes (Addendum)
Pt in radiology, family in room.

## 2015-02-13 NOTE — ED Notes (Signed)
[facility] at bedside 

## 2015-02-14 DIAGNOSIS — E782 Mixed hyperlipidemia: Secondary | ICD-10-CM

## 2015-02-14 DIAGNOSIS — W19XXXA Unspecified fall, initial encounter: Secondary | ICD-10-CM

## 2015-02-14 LAB — BASIC METABOLIC PANEL
ANION GAP: 9 (ref 5–15)
ANION GAP: 5 (ref 5–15)
Anion gap: 7 (ref 5–15)
BUN: 12 mg/dL (ref 6–20)
BUN: 12 mg/dL (ref 6–20)
BUN: 12 mg/dL (ref 6–20)
CALCIUM: 8.9 mg/dL (ref 8.9–10.3)
CO2: 23 mmol/L (ref 22–32)
CO2: 25 mmol/L (ref 22–32)
CO2: 26 mmol/L (ref 22–32)
CREATININE: 1.04 mg/dL (ref 0.61–1.24)
Calcium: 9.3 mg/dL (ref 8.9–10.3)
Calcium: 8.9 mg/dL (ref 8.9–10.3)
Chloride: 88 mmol/L — ABNORMAL LOW (ref 101–111)
Chloride: 92 mmol/L — ABNORMAL LOW (ref 101–111)
Chloride: 90 mmol/L — ABNORMAL LOW (ref 101–111)
Creatinine, Ser: 0.9 mg/dL (ref 0.61–1.24)
Creatinine, Ser: 1.01 mg/dL (ref 0.61–1.24)
GFR calc Af Amer: >60 mL/min (ref >60)
GFR calc non Af Amer: >60 mL/min (ref >60)
GFR calc non Af Amer: >60 mL/min (ref >60)
GLUCOSE: 133 mg/dL — AB (ref 65–99)
Glucose, Bld: 114 mg/dL — ABNORMAL HIGH (ref 65–99)
Glucose, Bld: 123 mg/dL — ABNORMAL HIGH (ref 65–99)
POTASSIUM: 4 mmol/L (ref 3.5–5.1)
Potassium: 4.2 mmol/L (ref 3.5–5.1)
Potassium: 4.1 mmol/L (ref 3.5–5.1)
SODIUM: 120 mmol/L — AB (ref 135–145)
SODIUM: 123 mmol/L — AB (ref 135–145)
Sodium: 122 mmol/L — ABNORMAL LOW (ref 135–145)

## 2015-02-14 LAB — CBC
HEMATOCRIT: 33.6 % — AB (ref 39.0–52.0)
Hemoglobin: 11.7 g/dL — ABNORMAL LOW (ref 13.0–17.0)
MCH: 32.8 pg (ref 26.0–34.0)
MCHC: 34.8 g/dL (ref 30.0–36.0)
MCV: 94.1 fL (ref 78.0–100.0)
Platelets: 183 10*3/uL (ref 150–400)
RBC: 3.57 MIL/uL — ABNORMAL LOW (ref 4.22–5.81)
RDW: 12.6 % (ref 11.5–15.5)
WBC: 6.1 10*3/uL (ref 4.0–10.5)

## 2015-02-14 LAB — CREATININE, URINE, RANDOM: CREATININE, URINE: 131.92 mg/dL

## 2015-02-14 LAB — CORTISOL: Cortisol, Plasma: 13.7 ug/dL

## 2015-02-14 LAB — MAGNESIUM: Magnesium: 1.8 mg/dL (ref 1.7–2.4)

## 2015-02-14 LAB — OSMOLALITY: OSMOLALITY: 259 mosm/kg — AB (ref 275–300)

## 2015-02-14 LAB — SODIUM, URINE, RANDOM: SODIUM UR: 29 mmol/L

## 2015-02-14 LAB — OSMOLALITY, URINE: OSMOLALITY UR: 619 mosm/kg (ref 390–1090)

## 2015-02-14 MED ORDER — DEXTROSE 5 % IV SOLN
1.0000 g | INTRAVENOUS | Status: DC
  Administered 2015-02-14 – 2015-02-15 (×2): 1 g via INTRAVENOUS
  Filled 2015-02-14 (×3): qty 10

## 2015-02-14 MED ORDER — ONDANSETRON HCL 4 MG/2ML IJ SOLN: 4.0000 mg | Freq: Four times a day (QID) | INTRAMUSCULAR | Status: DC | PRN

## 2015-02-14 MED ORDER — SODIUM CHLORIDE 0.9 % IV SOLN
INTRAVENOUS | Status: DC
  Administered 2015-02-14 – 2015-02-15 (×2): via INTRAVENOUS
  Filled 2015-02-14: qty 1000

## 2015-02-14 MED ORDER — TRAMADOL-ACETAMINOPHEN 37.5-325 MG PO TABS: 1.0000 | ORAL_TABLET | ORAL | Status: DC | PRN

## 2015-02-14 NOTE — Progress Notes (Signed)
[facility] PROGRESS NOTE  Corey Davila XNT:[id number] DOB: 1924/03/17 DOA: 02/13/2015 PCP: Gara Kroner, MD  Assessment/Plan: #1 acute hyponatremia Likely secondary to DDAVP and possible dehydration. Patient states was started on DDAVP for nocturnal enuresis. Patient denies any nausea vomiting or diarrhea. Patient states he has good oral intake. Urine osmolality was 619. Urine sodium was 29. Urine creatinine was 131.92. Will check a serum osmolality. Cortisol level was 13.7. Magnesium level of 1.8. TSH was 3.861. CT chest showed stable multiple pulmonary nodules from 10/08/2014 and an acute T11 compression fracture. Will discontinue patient's DDAVP. Place on IV fluids. Follow sodium levels. If no improvement in hyponatremia in the next 24 hours will consult with nephrology for further evaluation and management. Follow.  #2 falls Likely secondary to problem #1.  #3 acute traumatic T11 compression fracture per CT chest Secondary to mechanical fall. Spoke with Dr. Arnoldo Morale of neurosurgery's nurse who stated that Dr. Arnoldo Morale had reviewed films and have spoken with the ED physician on recommendations. Per Dr. Arnoldo Morale nurse his recommendations are to place patient in the lumbar corset and have patient follow-up in the outpatient setting. Pain management. PT/OT.  #4 urinary tract infection Urine cultures pending. IV Rocephin.  #5 possible recurrence of hepatocellular carcinoma Being followed by Dr. Crisoforo Oxford, oncology at [facility]. Outpatient follow-up.  #6 history of renal cell cancer status post left nephrectomy in the 1980s Stable. Outpatient follow-up.  #7 hyperlipidemia Continue statin.  #8 prophylaxis Lovenox for DVT prophylaxis.  Code Status: Full Family Communication: Updated patient wife and daughter at bedside. Disposition Plan: Home when medically stable and hyponatremia has resolved.   Consultants:  None  Procedures:  CT chest 02/13/2015  CT head  02/13/2015  Rib films 02/13/2015  Antibiotics:  IV Rocephin 02/13/2015  HPI/Subjective: Patient denies any nausea. No vomiting. Patient states he's feeling better than he did on admission. Patient with no falls. Patient denies any back pain.  Objective: Filed Vitals:   02/14/15 1328  BP: 140/66  Pulse: 75  Temp: 97.7 F (36.5 C)  Resp: 18    Intake/Output Summary (Last 24 hours) at 02/14/15 1413 Last data filed at 02/14/15 0830  Gross per 24 hour  Intake    120 ml  Output   1100 ml  Net   -980 ml   Filed Weights   02/13/15 0230  Weight: 68.04 kg (150 lb)    Exam:   General:  NAD  Cardiovascular: RRR  Respiratory: CTAB  Abdomen: Soft, nontender, nondistended, positive bowel sounds.  Musculoskeletal: No clubbing cyanosis or edema. Patient with a bruise on left upper scapular region.  Data Reviewed: Basic Metabolic Panel:  Recent Labs Lab 02/13/15 0517 02/14/15 0549 02/14/15 1112  NA 122* 120*  --   K 4.4 4.0  --   CL 92* 88*  --   CO2  --  23  --   GLUCOSE 109* 114*  --   BUN 14 12  --   CREATININE 0.80 0.90  --   CALCIUM  --  9.3  --   MG  --   --  1.8   Liver Function Tests: No results for input(s): AST, ALT, ALKPHOS, BILITOT, PROT, ALBUMIN in the last 168 hours. No results for input(s): LIPASE, AMYLASE in the last 168 hours. No results for input(s): AMMONIA in the last 168 hours. CBC:  Recent Labs Lab 02/13/15 0506 02/13/15 0517 02/14/15 1112  WBC 9.4  --  6.1  NEUTROABS 7.3  --   --  HGB 13.1 14.6 11.7*  HCT 37.6* 43.0 33.6*  MCV 94.5  --  94.1  PLT 207  --  183   Cardiac Enzymes: No results for input(s): CKTOTAL, CKMB, CKMBINDEX, TROPONINI in the last 168 hours. BNP (last 3 results) No results for input(s): BNP in the last 8760 hours.  ProBNP (last 3 results) No results for input(s): PROBNP in the last 8760 hours.  CBG: No results for input(s): GLUCAP in the last 168 hours.  Recent Results (from the past 240 hour(s))   Urine culture     Status: None (Preliminary result)   Collection Time: 02/13/15  6:01 AM  Result Value Ref Range Status   Specimen Description URINE, RANDOM  Final   Special Requests NONE  Final   Culture   Final    CULTURE REINCUBATED FOR BETTER GROWTH Performed at [facility]    Report Status PENDING  Incomplete     Studies: Dg Ribs Unilateral W/chest Left  02/13/2015   CLINICAL DATA:  [address] after losing is balance, striking is posterior head on bathroom cabinets.  EXAM: LEFT RIBS AND CHEST - 3+ VIEW  COMPARISON:  10/08/2014  FINDINGS: There are old or subacute fractures of the left fifth through ninth ribs. No acute displaced fracture is evident. Mediastinal contours and normal. There is no pneumothorax. The lungs are clear.  IMPRESSION: No acute findings   Electronically Signed   By: Andreas Newport M.D.   On: 02/13/2015 03:58   Ct Head Wo Contrast  02/13/2015   CLINICAL DATA:  Lost balance in the bathroom and fell, striking the back of his head on cabinets  EXAM: CT HEAD WITHOUT CONTRAST  TECHNIQUE: Contiguous axial images were obtained from the base of the skull through the vertex without intravenous contrast.  COMPARISON:  03/30/2008  FINDINGS: There is prominent calcification of the interhemispheric falx. There also is mild calcification of the tentorium. No intracranial hemorrhage is evident. There is moderately severe generalized atrophy. There is mild white matter hypodensity consistent with chronic small vessel ischemic disease. The calvarium and skullbase are intact.  IMPRESSION: Negative for acute intracranial traumatic injury. Moderately severe generalized atrophy and chronic microvascular changes are present.   Electronically Signed   By: Andreas Newport M.D.   On: 02/13/2015 04:03   Ct Chest W Contrast  02/13/2015   CLINICAL DATA:  [place] [address] in bathroom.  Left mid to upper back pain.  EXAM: CT CHEST WITH CONTRAST  TECHNIQUE: Multidetector CT imaging of the chest was  performed during intravenous contrast administration.  CONTRAST:  132mL OMNIPAQUE IOHEXOL 300 MG/ML  SOLN  COMPARISON:  10/08/2014  FINDINGS: There is acute anterior compression fracture of T11 with approximately 50% loss of height anteriorly. There is no bony retropulsion into the central canal. The pedicles are intact. Facet articulations are intact. No other acute fractures are evident. There are remote fractures of the left fifth through tenth ribs. Multiple pulmonary nodules are again evident without significant interval change from 10/08/2014. These are suspicious for hematogenous metastases. There is no adenopathy. There are no effusions. The airways are patent.  IMPRESSION: 1. Acute T11 compression fracture with approximately 50% loss of height anteriorly. No central canal compromise. Posterior elements are intact. 2. Multiple pulmonary nodules, suspicious for hematogenous metastases. Unchanged from 10/08/2014.   Electronically Signed   By: Andreas Newport M.D.   On: 02/13/2015 06:31    Scheduled Meds: . aspirin EC  81 mg Oral Daily  . calcium carbonate  1 tablet  Oral Q breakfast  . desmopressin  100 mcg Oral QHS  . docusate sodium  100 mg Oral Daily  . enoxaparin (LOVENOX) injection  40 mg Subcutaneous Daily  . finasteride  5 mg Oral Daily  . rosuvastatin  20 mg Oral QODAY  . sodium chloride  3 mL Intravenous Q12H  . vitamin B-12  250 mcg Oral Daily   Continuous Infusions: . sodium chloride 0.9 % 1,000 mL infusion      Principal Problem:   Acute hyponatremia Active Problems:   History of kidney cancer   Mixed hyperlipidemia   Malignant neoplasm of liver, primary   UTI (lower urinary tract infection)   Traumatic compression fracture of T11 thoracic vertebra    Time spent: 33 minutes    Rutilio Yellowhair M.D. [facility] Pager [phone]. If 7PM-7AM, please contact night-coverage at [email or web address], password [facility] 02/14/2015, 2:13 PM  LOS: 1 day

## 2015-02-14 NOTE — Progress Notes (Signed)
Orthopedic Tech Progress Note Patient Details:  Corey Davila 07-03-1924 [id number] Called [employer] for lumbar corsette brace. Patient ID: Corey Davila, male   DOB: [birth year], 81 y.o.   MRN: [id number]   Braulio Bosch 02/14/2015, 4:44 PM

## 2015-02-14 NOTE — Progress Notes (Signed)
Physical Therapy Treatment Patient Details Name: Corey Davila MRN: [id number] DOB: 01-02-1924 Today's Date: 02/14/2015    History of Present Illness 81 year old male with history of hepatocellular carcinoma, dyslipidemia, low back pain with balance issue for the past 4 months who presented [facility] [place] after sustaining a fall in the bathroom during the night. A recent MRI showed multiple disc is in relation with spinal stenosis but no fracture or cord compression.  Now with new T11 compression fx with this admission. Other medical workup pending.      PT Comments    Pt is progressing well with his mobility, but I feel he needs to use the RW full time (both upstairs and downstairs and outside) and initially have 24/7 assistance any time he is on his feet.  His daughter and wife are both hoping to take him home and avoid SNF placement.  They would like to re-start HHPT with [facility] and request [facility].  I think as long as they continue to state that they can provide 24/7 assistance when he is up and walking (I told them even his 2 am trips to the bathroom) that he can go home with HHPT f/u at discharge.  PT will continue to follow acutely (daily) to try to progress strength and balance training so that he can discharge home safely.   Follow Up Recommendations  Home health PT;Supervision/Assistance - 24 hour     Equipment Recommendations  None recommended by PT (family to borrow a second RW to use upstairs )    Recommendations for Other Services   NA     Precautions / Restrictions Precautions Precautions: Fall;Back (back for comfort) Precaution Comments: Reviewed back precautions and log roll technique for comfort.     Mobility  Bed Mobility Overal bed mobility: Modified Independent Bed Mobility: Rolling;Sidelying to Sit;Sit to Sidelying Rolling: Modified independent (Device/Increase time) Sidelying to sit: Modified independent (Device/Increase time)     Sit to sidelying: Min  assist General bed mobility comments: Mod I to get up to EOB using bed rail which he has at home, min assist to help with one leg to get back into bed using reverse log roll technique to help protect his back.   Transfers Overall transfer level: Needs assistance Equipment used: Rolling walker (2 wheeled)   Sit to Stand: Supervision         General transfer comment: close supervision for safety, verbal cues for safe hand placement and slow control to sit and to not pull on RW to stand.   Ambulation/Gait Ambulation/Gait assistance: Min guard Ambulation Distance (Feet): 250 Feet Assistive device: Rolling walker (2 wheeled) Gait Pattern/deviations: Step-to pattern;Shuffle;Trunk flexed (decreased bil foot clearance)     General Gait Details: Pt needed verbal cues and light manual assist to steer Rollator, he was easily distracted in the busy hallway and needed cues to stay on task and stay inside RW handles for safety.  He did show signs of fatigue as he continued to walk with decreased foot clearance and he would continue to get further away from his RW. His strategy then was to speed up to get done fater which was also not safe.           Balance Overall balance assessment: Needs assistance Sitting-balance support: Feet supported;Bilateral upper extremity supported Sitting balance-Leahy Scale: Fair     Standing balance support: Bilateral upper extremity supported Standing balance-Leahy Scale: Poor Standing balance comment: attempted to stand without support and in <10 seconds pt  lost his balance backward and PT provided min assist to right himself.                     Cognition Arousal/Alertness: Awake/alert Behavior During Therapy: WFL for tasks assessed/performed Overall Cognitive Status: Within Functional Limits for tasks assessed (not specifically tested, but conversation and general answer)                      Exercises General Exercises - Lower  Extremity Long Arc Quad: AROM;Both;10 reps;Seated Hip ABduction/ADduction: AROM;Both;10 reps;Seated Hip Flexion/Marching: AROM;Both;10 reps;Seated Toe Raises: AROM;Both;10 reps;Seated Heel Raises: AROM;Both;10 reps;Seated Other Exercises Other Exercises: Tandem and semi tandem standing at the sink, min assist during practice for LOB.   Other Exercises: Backward walking trials min hand held assist x 5 ' x 4 reps        Pertinent Vitals/Pain Pain Assessment: Faces Faces Pain Scale: Hurts little more Pain Location: low back  Pain Descriptors / Indicators: Aching Pain Intervention(s): Limited activity within patient's tolerance;Monitored during session;Repositioned           PT Goals (current goals can now be found in the care plan section) Acute Rehab PT Goals Patient Stated Goal: to go home if able with 24/7 supervision and HH, to stop falling backwards Progress towards PT goals: Progressing toward goals    Frequency  Min 3X/week    PT Plan Discharge plan needs to be updated;Frequency needs to be updated       End of Session Equipment Utilized During Treatment: Gait belt Activity Tolerance: Patient limited by fatigue;Patient limited by pain Patient left: in bed;with call bell/phone within reach;with family/visitor present     Time: 1610-9604 PT Time Calculation (min) (ACUTE ONLY): 61 min  Charges:  $Gait Training: 8-22 mins $Therapeutic Exercise: 23-37 mins $Self Care/Home Management: 8-22                     Corey Davila, PT, DPT [phone]   02/14/2015, 1:39 PM

## 2015-02-15 LAB — BASIC METABOLIC PANEL
Anion gap: 8 (ref 5–15)
BUN: 7 mg/dL (ref 6–20)
CO2: 23 mmol/L (ref 22–32)
Calcium: 8.8 mg/dL — ABNORMAL LOW (ref 8.9–10.3)
Chloride: 96 mmol/L — ABNORMAL LOW (ref 101–111)
Creatinine, Ser: 0.8 mg/dL (ref 0.61–1.24)
GFR calc Af Amer: >60 mL/min (ref >60)
GFR calc non Af Amer: >60 mL/min (ref >60)
Glucose, Bld: 103 mg/dL — ABNORMAL HIGH (ref 65–99)
Potassium: 3.8 mmol/L (ref 3.5–5.1)
Sodium: 127 mmol/L — ABNORMAL LOW (ref 135–145)

## 2015-02-15 LAB — CBC
HCT: 33.9 % — ABNORMAL LOW (ref 39.0–52.0)
Hemoglobin: 11.8 g/dL — ABNORMAL LOW (ref 13.0–17.0)
MCH: 33.2 pg (ref 26.0–34.0)
MCHC: 34.8 g/dL (ref 30.0–36.0)
MCV: 95.5 fL (ref 78.0–100.0)
Platelets: 173 10*3/uL (ref 150–400)
RBC: 3.55 MIL/uL — ABNORMAL LOW (ref 4.22–5.81)
RDW: 12.7 % (ref 11.5–15.5)
WBC: 5.6 10*3/uL (ref 4.0–10.5)

## 2015-02-15 NOTE — Progress Notes (Signed)
Physical Therapy Treatment Patient Details Name: Corey Davila MRN: [id number] DOB: 1924-04-04 Today's Date: 02/15/2015    History of Present Illness 81 yo admitted after sustaining a fall in the bathroom during the night. He reports landing on his back and his left chest. Also reports that he may have hit his head did not lose consciousness. He had severe pain in his mid and low back and his left chest.  Now diagnosed with T11 compression fracture and has brace for support    PT Comments    Pt was seen for continued therapy but too tired for there ex and longer gait.  Brace for spine in room with no order and left MD a message to give orders for use of brace to follow through.  Will check on the order before attempting further mobility.    Follow Up Recommendations  Home health PT     Equipment Recommendations  Rolling walker with 5" wheels (family will borrow and wont need hosp to obtain)    Recommendations for Other Services OT consult     Precautions / Restrictions Precautions Precautions: Fall;Back Precaution Comments: pt positioned in bed for back precautions Restrictions Weight Bearing Restrictions: No    Mobility  Bed Mobility Overal bed mobility: Needs Assistance Bed Mobility: Rolling;Sit to Supine Rolling: Min assist;Min guard Sidelying to sit: Min guard;Min assist       General bed mobility comments: reminders for supporting effort by using LE's for rolling  Transfers Overall transfer level: Needs assistance Equipment used: Rolling walker (2 wheeled) Transfers: Sit to/from [facility] Sit to Stand: Min guard Stand pivot transfers: Min guard       General transfer comment: safety cues with hand placement and directing turn to sit  Ambulation/Gait             General Gait Details: tired from [facility] and declined a longer walk   Stairs            Wheelchair Mobility    Modified Rankin (Stroke Patients Only)       Balance            Standing balance support: Bilateral upper extremity supported Standing balance-Leahy Scale: Fair                      Cognition Arousal/Alertness: Awake/alert Behavior During Therapy: WFL for tasks assessed/performed Overall Cognitive Status: Impaired/Different from baseline Area of Impairment: Safety/judgement         Safety/Judgement: Decreased awareness of safety;Decreased awareness of deficits Awareness: Intellectual        Exercises      General Comments General comments (skin integrity, edema, etc.): Pt was up in flexed sitting posture with spinal corset behind him on counter.  Order not in computer yet so spoke with nursing to agree to ask for order      Pertinent Vitals/Pain Pain Assessment: Faces Faces Pain Scale: Hurts little more Pain Location: low back Pain Intervention(s): Limited activity within patient's tolerance;Repositioned    Home Living Family/patient expects to be discharged to:: Private residence Living Arrangements: Spouse/significant other;Children Available Help at Discharge: Family;Available 24 hours/day                Prior Function            PT Goals (current goals can now be found in the care plan section) Acute Rehab PT Goals Patient Stated Goal: to go home if able with 24/7 supervision and HH, to stop falling  backwards Progress towards PT goals: Progressing toward goals    Frequency  Min 3X/week    PT Plan Current plan remains appropriate    Co-evaluation             End of Session   Activity Tolerance: Patient limited by fatigue;Patient limited by lethargy Patient left: in bed;with call bell/phone within reach;with family/visitor present     Time: 1511-1530 PT Time Calculation (min) (ACUTE ONLY): 19 min  Charges:  $Therapeutic Activity: 8-22 mins                    G Codes:      Ramond Dial 03/12/2015, 3:41 PM   Mee Hives, PT MS Acute Rehab Dept. Number: [facility] [phone] and [place]  [phone]

## 2015-02-15 NOTE — Care Management Note (Signed)
Case Management Note  Patient Details  Name: BILBO CARCAMO MRN: [id number] Date of Birth: 01-05-1924  Subjective/Objective:        NCM spoke with patient, wife and daughter, they would like to work with [facility] for [place], they are waiting to speak with MD.  Patient is taking lovenox inj now, daughter states her sister will be able to give him these inj if he needs them.  NCM will await further dc needs. Referral made to [facility] [place] notified for HHPt.  Soc will begin 24-48 hrs post dc.            Action/Plan:   Expected Discharge Date:                  Expected Discharge Plan:  [place]  In-House Referral:     Discharge planning Services  CM Consult  Post Acute Care Choice:  Home Health Choice offered to:  Adult Children  DME Arranged:    DME Agency:     HH Arranged:  PT HH Agency:     Status of Service:  In process, will continue to follow  Medicare Important Message Given:    Date Medicare IM Given:    Medicare IM give by:    Date Additional Medicare IM Given:    Additional Medicare Important Message give by:     If discussed at [place] of Stay Meetings, dates discussed:    Additional Comments:   NCM spoke with daughter, patient is for dc today, NCM gave daughter private duty list as well  Because she was interested in having someone sit with patient for couple of hours some time.  Daughter states they are not interested in having [place], or Social Worker or [facility].  Patient will be transported by car today.  Miranda with [facility] notified of dc.  Soc will begin 24-48 hrs post dc.   Zenon Mayo, RN 02/15/2015, 11:20 AM

## 2015-02-15 NOTE — Clinical Social Work Note (Signed)
Clinical Social Worker recognized initial PT recommendation for SNF.  Per recent PT notes, patient family plans to provide 24/7 assistance at home with [place] (recommendation updated).  CM to arrange patient home health needs prior to discharge.  Clinical Social Worker will sign off for now as social work intervention is no longer needed. Please consult [place] again if new need arises.  Barbette Or, [place]

## 2015-02-15 NOTE — Progress Notes (Addendum)
[facility] PROGRESS NOTE  Corey Davila CVE:[id number] DOB: [birth year] DOA: 02/13/2015 PCP: Gara Kroner, MD  Brief history of present illness 81 year old Caucasian male presented with weakness and falls. Found to have acute hyponatremia. Sodium level was normal in March. Noted to be on DDAVP which was started for nocturnal enuresis. Sodium is improving with normal saline. Also has a T11 compression fracture.  Assessment/Plan:  #1 acute hyponatremia Likely secondary to DDAVP. Patient states was started on DDAVP for nocturnal enuresis. This was started about a year ago. Dose was increased in March. Urine osmolality is noted to be 619, which is very high. Urine sodium is 29. Some of these findings are more suggestive of SIADH, which could suggest DDAVP as a reason for his presentation. Patient denies any nausea vomiting or diarrhea. Patient states he has good oral intake. Urine creatinine was 131.92. Cortisol level was 13.7. Magnesium level of 1.8. TSH was 3.861. CT chest showed multiple pulmonary nodules stable from 10/08/2014 and an acute T11 compression fracture. Patient's DDAVP was discontinued. Sodium level is improving. Continue normal saline infusion. Patient still feels weak. PT and OT following.   #2 falls Likely secondary to problem #1. PT and OT following.  #3 acute traumatic T11 compression fracture per CT chest Secondary to mechanical fall. Spoke with Dr. Arnoldo Morale of neurosurgery's nurse who stated that Dr. Arnoldo Morale had reviewed films and have spoken with the ED physician on recommendations. Per Dr. Arnoldo Morale nurse his recommendations are to place patient in the lumbar corset and have patient follow-up in the outpatient setting. Pain management. PT/OT. He also has chronic lumbar issues for which he is followed by an orthopedist.  #4 urinary tract infection Urine cultures pending. IV Rocephin.  #5 possible recurrence of hepatocellular carcinoma Being followed by Dr. Crisoforo Oxford,  oncology at [facility]. Outpatient follow-up.  #6 history of renal cell cancer status post left nephrectomy in the 1980s Stable. Outpatient follow-up.  #7 hyperlipidemia Continue statin.  #8 prophylaxis Lovenox for DVT prophylaxis.  Code Status: Full Family Communication: Updated patient wife and daughter at bedside. Disposition Plan: If sodium continues to improve. Patient should be able to go home tomorrow.    Consultants:  None  Procedures:  CT chest 02/13/2015  CT head 02/13/2015  Rib films 02/13/2015  Antibiotics:  IV Rocephin 02/13/2015  Subjective: Patient complains of fatigue and generalized weakness. Denies any chest pain, shortness of breath. No nausea, vomiting.   Objective: Filed Vitals:   02/15/15 0458  BP: 136/69  Pulse: 70  Temp: 98.2 F (36.8 C)  Resp: 18    Intake/Output Summary (Last 24 hours) at 02/15/15 1143 Last data filed at 02/15/15 1002  Gross per 24 hour  Intake    240 ml  Output   1450 ml  Net  -1210 ml   Filed Weights   02/13/15 0230  Weight: 68.04 kg (150 lb)    Exam:   General:  NAD  Cardiovascular: S1, S2 is normal, regular. No S3, S4. No rubs, murmurs, or bruit.  Respiratory: Clear to auscultation bilaterally  Abdomen: Soft, nontender, nondistended, positive bowel sounds.  Musculoskeletal: No clubbing cyanosis or edema. Patient with a bruise on left upper scapular region. Able to lift both legs off the bed. No focal neurological deficits noted  Data Reviewed: Basic Metabolic Panel:  Recent Labs Lab 02/13/15 0517 02/14/15 0549 02/14/15 1112 02/14/15 1536 02/14/15 2008 02/15/15 0514  NA 122* 120*  --  122* 123* 127*  K 4.4 4.0  --  4.2  4.1 3.8  CL 92* 88*  --  92* 90* 96*  CO2  --  23  --  25 26 23   GLUCOSE 109* 114*  --  133* 123* 103*  BUN 14 12  --  12 12 7   CREATININE 0.80 0.90  --  1.04 1.01 0.80  CALCIUM  --  9.3  --  8.9 8.9 8.8*  MG  --   --  1.8  --   --   --    CBC:  Recent  Labs Lab 02/13/15 0506 02/13/15 0517 02/14/15 1112 02/15/15 0514  WBC 9.4  --  6.1 5.6  NEUTROABS 7.3  --   --   --   HGB 13.1 14.6 11.7* 11.8*  HCT 37.6* 43.0 33.6* 33.9*  MCV 94.5  --  94.1 95.5  PLT 207  --  183 173    Recent Results (from the past 240 hour(s))  Urine culture     Status: None (Preliminary result)   Collection Time: 02/13/15  6:01 AM  Result Value Ref Range Status   Specimen Description URINE, RANDOM  Final   Special Requests NONE  Final   Culture   Final    CULTURE REINCUBATED FOR BETTER GROWTH Performed at [facility]    Report Status PENDING  Incomplete     Studies: No results found.  Scheduled Meds: . aspirin EC  81 mg Oral Daily  . calcium carbonate  1 tablet Oral Q breakfast  . cefTRIAXone (ROCEPHIN)  IV  1 g Intravenous Q24H  . docusate sodium  100 mg Oral Daily  . enoxaparin (LOVENOX) injection  40 mg Subcutaneous Daily  . finasteride  5 mg Oral Daily  . rosuvastatin  20 mg Oral QODAY  . sodium chloride  3 mL Intravenous Q12H  . vitamin B-12  250 mcg Oral Daily   Continuous Infusions: . sodium chloride 0.9 % 1,000 mL infusion 100 mL/hr at 02/15/15 0142    Principal Problem:   Acute hyponatremia Active Problems:   History of kidney cancer   Mixed hyperlipidemia   Malignant neoplasm of liver, primary   UTI (lower urinary tract infection)   Traumatic compression fracture of T11 thoracic vertebra   Fall    Time spent: 58 minutes    Mystie Ormand M.D. [facility] Pager [phone].   If 7PM-7AM, please contact night-coverage at [email or web address], password [facility] 02/15/2015, 11:43 AM  LOS: 2 days

## 2015-02-16 DIAGNOSIS — S22080S Wedge compression fracture of T11-T12 vertebra, sequela: Secondary | ICD-10-CM

## 2015-02-16 DIAGNOSIS — E871 Hypo-osmolality and hyponatremia: Secondary | ICD-10-CM | POA: Insufficient documentation

## 2015-02-16 DIAGNOSIS — W19XXXD Unspecified fall, subsequent encounter: Secondary | ICD-10-CM

## 2015-02-16 LAB — BASIC METABOLIC PANEL
Anion gap: 9 (ref 5–15)
BUN: 8 mg/dL (ref 6–20)
CO2: 24 mmol/L (ref 22–32)
Calcium: 8.5 mg/dL — ABNORMAL LOW (ref 8.9–10.3)
Chloride: 100 mmol/L — ABNORMAL LOW (ref 101–111)
Creatinine, Ser: 0.88 mg/dL (ref 0.61–1.24)
GFR calc Af Amer: >60 mL/min (ref >60)
GFR calc non Af Amer: >60 mL/min (ref >60)
Glucose, Bld: 97 mg/dL (ref 65–99)
Potassium: 3.3 mmol/L — ABNORMAL LOW (ref 3.5–5.1)
Sodium: 133 mmol/L — ABNORMAL LOW (ref 135–145)

## 2015-02-16 LAB — CBC
HCT: 33.6 % — ABNORMAL LOW (ref 39.0–52.0)
Hemoglobin: 11.5 g/dL — ABNORMAL LOW (ref 13.0–17.0)
MCH: 32.5 pg (ref 26.0–34.0)
MCHC: 34.2 g/dL (ref 30.0–36.0)
MCV: 94.9 fL (ref 78.0–100.0)
PLATELETS: 179 10*3/uL (ref 150–400)
RBC: 3.54 MIL/uL — AB (ref 4.22–5.81)
RDW: 12.9 % (ref 11.5–15.5)
WBC: 4.9 10*3/uL (ref 4.0–10.5)

## 2015-02-16 NOTE — Care Management Important Message (Signed)
Important Message  Patient Details  Name: [name] MRN: [id number] Date of Birth: 17-Nov-1923   Medicare Important Message Given:  [facility] notification given    Nathen May 02/16/2015, 12:32 [place] Message  Patient Details  Name: [name] MRN: [id number] Date of Birth: 1923-07-19   Medicare Important Message Given:  Yes-second notification given    Nathen May 02/16/2015, 12:32 PM

## 2015-02-16 NOTE — Discharge Summary (Signed)
Physician Discharge Summary  Corey Davila OVZ:[id number] DOB: 1923-12-13 DOA: 02/13/2015  PCP: Gara Kroner, MD  Admit date: 02/13/2015 Discharge date: 02/16/2015  Recommendations for Outpatient Follow-up:  1. Pt will need to follow up with PCP in 1 week post discharge 2. Please obtain BMP in one week Discharge Diagnoses:  #1 acute hyponatremia Likely secondary to DDAVP. Patient states was started on DDAVP for nocturnal enuresis. This was started about a year ago. Dose was increased in March. Urine osmolality is noted to be 619, which is very high. Urine sodium is 29. Some of these findings are more suggestive of SIADH, which could suggest DDAVP as a reason for his presentation. Patient denies any nausea vomiting or diarrhea. Patient states he has good oral intake. Urine creatinine was 131.92. Cortisol level was 13.7. Magnesium level of 1.8. TSH was 3.861. CT chest showed multiple pulmonary nodules stable from 10/08/2014 and an acute T11 compression fracture. Patient's DDAVP was discontinued. Sodium level is improving. Continue normal saline infusion.  -Physical therapy recommended home health physical therapy which was set up with assistance of care management -Sodium 133 on the day of discharge -Dr. Dalhstedt's office was contacted and message left with RN that pt will not restart DDAVP -Patient and patient's daughter both stated that his enuresis did not have any significant improvement even while on desmopressin; therefore, they prefer to stay off of desmopressin altogether for now and follow up with Dr. Zannie Cove in the office  #2 falls/generalized weakness -Multifactorial including deconditioning, lumbar compression fracture, and hyponatremia Likely secondary to problem #1. PT and OT following.  #3 acute traumatic T11 compression fracture per CT chest Secondary to mechanical fall. Spoke with Dr. Arnoldo Morale of neurosurgery's nurse who stated that Dr. Arnoldo Morale had reviewed films and have  spoken with the ED physician on recommendations. Per Dr. Arnoldo Morale nurse his recommendations are to place patient in the lumbar corset and have patient follow-up in the outpatient setting. Pain management. PT/OT. He also has chronic lumbar issues for which he is followed by an orthopedist. -Patient has follow-up appointment with Dr. Arnoldo Morale 03/07/2015  #4 Bacteruria/Question urinary tract infection -Patient received 3 days intravenous ceftriaxone -Question whether patient truly had urinary tract infection as there is no significant pyuria -will not continue antibiotics at the time of discharge  #5 possible recurrence of hepatocellular carcinoma Being followed by Dr. Crisoforo Oxford, oncology at [facility]. Outpatient follow-up.  #6 history of renal cell cancer status post left nephrectomy in the 1980s Stable. Outpatient follow-up.  #7 hyperlipidemia Continue statin.  #8 prophylaxis Lovenox for DVT prophylaxis.  Discharge Condition: stable  Disposition: home Follow-up Information    Follow up with Ophelia Charter, MD On 03/07/2015.   Specialty:  Neurosurgery   Why:  f/u at 10:30am,. Please be at office by 10AM.   Contact information:   [address] [place] [address] [phone]       Follow up with [facility].   Why:  [facility]   Contact information:   [place] [address] [phone]       Diet:regular Wt Readings from Last 3 Encounters:  02/13/15 68.04 kg (150 lb)  01/04/15 70.126 kg (154 lb 9.6 oz)  10/08/14 68.04 kg (150 lb)    History of present illness:  81 year old male with history of hepatocellular carcinoma with partial resection in 2008 and 2012 (suspected to have recurrence lately but patient refused to have further aggressive treatment done and recommended for 3 monthly  follow-up), dyslipidemia, low back pain with balance issue for the past 4 months who presented to [facility] after  sustaining a fall in the bathroom during the night. He reports landing on his back and his left chest. Also reports that he may have hit his head did not lose consciousness. He had severe pain in his mid and low back and his left chest. Patient was admitted in March of this year after he sustained a fall. He reports that since then he has been having balance issues and for the past 2 months having low back pain. He is seeing an orthopedic surgeon at [facility] for this and had a recent MRI which showed multiple disc is in relation with spinal stenosis but no fracture or cord compression.  MRI brain on 01/14/2015 was negative for any acute findings showing only ventricular dilatation. MRI lumbar spine 01/28/2015 showed multilevel disc bulging with DJD.  A chest CT with contrast was done which again showed little pulmonary nodules suspicious for hematogenous metastases which is unchanged from his imaging in March. Also showed an acute T11 compression fracture with approximate 50% loss of height with no central canal compromise. Neurosurgery, Dr. Arnoldo Morale was consulted by ED physician.  He did not feel the patient needed any surgical intervention. Lumbar corset was provided. Physical therapy was consulted. The patient was set up for home health physical therapy. He has follow-up with neurosurgery 03/07/15.   Discharge Exam: Filed Vitals:   02/16/15 0629  BP: 153/63  Pulse: 70  Temp: 98.2 F (36.8 C)  Resp: 18   Filed Vitals:   02/15/15 0458 02/15/15 1610 02/15/15 2210 02/16/15 0629  BP: 136/69 111/73 113/49 153/63  Pulse: 70 75 60 70  Temp: 98.2 F (36.8 C) 99.5 F (37.5 C) 98.8 F (37.1 C) 98.2 F (36.8 C)  TempSrc: Oral Oral Oral Oral  Resp: 18 18 18 18   Height:      Weight:      SpO2: 94% 97% 93% 91%   General: A&O x 3, NAD, pleasant, cooperative Cardiovascular: RRR, no rub, no gallop, no S3 Respiratory: Scattered basilar rales. No wheeze.  Abdomen:soft, nontender, nondistended, positive  bowel sounds  Discharge Instructions      Discharge Instructions    Diet - low sodium heart healthy    Complete by:  As directed      Increase activity slowly    Complete by:  As directed             Medication List    STOP taking these medications        desmopressin 0.2 MG tablet  Commonly known as:  DDAVP      TAKE these medications        aspirin 81 MG tablet  Take 81 mg by mouth daily.     Calcium Citrate 200 MG Tabs  Take 1 tablet by mouth daily.     CALCIUM CITRATE PLUS/MAGNESIUM PO  Take 1 tablet by mouth daily.     Calcium-Vit D3-Phytosterols [phone] MG-UNIT-MG Tabs  Take 1 tablet by mouth daily.     CRESTOR 20 MG tablet  Generic drug:  rosuvastatin  Take 1 tablet by mouth every other day.     docusate sodium 100 MG capsule  Commonly known as:  COLACE  Take 100 mg by mouth daily.     finasteride 5 MG tablet  Commonly known as:  PROSCAR  Take 1 tablet by mouth daily.     Glucosamine-Chondroitin 250-200  MG Tabs  Take 1 tablet by mouth daily. Regular strength tabs.     LYCOPENE PO  Take 1 tablet by mouth daily.     meclizine 12.5 MG tablet  Commonly known as:  ANTIVERT  Take by mouth 3 (three) times daily as needed for dizziness.     OSTEO BI-FLEX JOINT SHIELD PO  Take 1 tablet by mouth 2 (two) times daily.     traMADol 50 MG tablet  Commonly known as:  ULTRAM  Take 1 tablet (50 mg total) by mouth every 6 (six) hours as needed.     vitamin B-12 250 MCG tablet  Commonly known as:  CYANOCOBALAMIN  Take 250 mcg by mouth daily.         The results of significant diagnostics from this hospitalization (including imaging, microbiology, ancillary and laboratory) are listed below for reference.    Significant Diagnostic Studies: Dg Ribs Unilateral W/chest Left  02/13/2015   CLINICAL DATA:  [address] after losing is balance, striking is posterior head on bathroom cabinets.  EXAM: LEFT RIBS AND CHEST - 3+ VIEW  COMPARISON:  10/08/2014  FINDINGS:  There are old or subacute fractures of the left fifth through ninth ribs. No acute displaced fracture is evident. Mediastinal contours and normal. There is no pneumothorax. The lungs are clear.  IMPRESSION: No acute findings   Electronically Signed   By: Andreas Newport M.D.   On: 02/13/2015 03:58   Ct Head Wo Contrast  02/13/2015   CLINICAL DATA:  Lost balance in the bathroom and fell, striking the back of his head on cabinets  EXAM: CT HEAD WITHOUT CONTRAST  TECHNIQUE: Contiguous axial images were obtained from the base of the skull through the vertex without intravenous contrast.  COMPARISON:  03/30/2008  FINDINGS: There is prominent calcification of the interhemispheric falx. There also is mild calcification of the tentorium. No intracranial hemorrhage is evident. There is moderately severe generalized atrophy. There is mild white matter hypodensity consistent with chronic small vessel ischemic disease. The calvarium and skullbase are intact.  IMPRESSION: Negative for acute intracranial traumatic injury. Moderately severe generalized atrophy and chronic microvascular changes are present.   Electronically Signed   By: Andreas Newport M.D.   On: 02/13/2015 04:03   Ct Chest W Contrast  02/13/2015   CLINICAL DATA:  [place] [address] in bathroom.  Left mid to upper back pain.  EXAM: CT CHEST WITH CONTRAST  TECHNIQUE: Multidetector CT imaging of the chest was performed during intravenous contrast administration.  CONTRAST:  152mL OMNIPAQUE IOHEXOL 300 MG/ML  SOLN  COMPARISON:  10/08/2014  FINDINGS: There is acute anterior compression fracture of T11 with approximately 50% loss of height anteriorly. There is no bony retropulsion into the central canal. The pedicles are intact. Facet articulations are intact. No other acute fractures are evident. There are remote fractures of the left fifth through tenth ribs. Multiple pulmonary nodules are again evident without significant interval change from 10/08/2014. These are  suspicious for hematogenous metastases. There is no adenopathy. There are no effusions. The airways are patent.  IMPRESSION: 1. Acute T11 compression fracture with approximately 50% loss of height anteriorly. No central canal compromise. Posterior elements are intact. 2. Multiple pulmonary nodules, suspicious for hematogenous metastases. Unchanged from 10/08/2014.   Electronically Signed   By: Andreas Newport M.D.   On: 02/13/2015 06:31   Mr Lumbar Spine Wo Contrast  01/28/2015   CLINICAL DATA:  Status post fall May 2016.  Low back pain.  EXAM: MRI LUMBAR  SPINE WITHOUT CONTRAST  TECHNIQUE: Multiplanar, multisequence MR imaging of the lumbar spine was performed. No intravenous contrast was administered.  COMPARISON:  04/16/2014  FINDINGS: The vertebral bodies of the lumbar spine are normal in size. The vertebral bodies of the lumbar spine are normal in alignment. There is normal bone marrow signal demonstrated throughout the vertebra. There is degenerative disc disease with disc height loss from L1-2 through L5-S1 with reactive endplate changes. There is a levoscoliosis of the lumbar spine.  The spinal cord is normal in signal and contour. The cord terminates normally at L1 . The nerve roots of the cauda equina and the filum terminale are normal.  The visualized portions of the SI joints are unremarkable.  The imaged intra-abdominal contents are unremarkable.  T12-L1: Mild broad-based disc bulge. Mild bilateral facet arthropathy. No evidence of neural foraminal stenosis. No central canal stenosis.  L1-L2: Mild eccentric left disc osteophyte complex. Moderate bilateral facet arthropathy. Moderate left lateral recess stenosis. Mild left foraminal stenosis. Mild spinal stenosis.  L2-L3: Mild broad-based disc bulge. Moderate -severe bilateral facet arthropathy with ligamentum flavum infolding resulting in moderate spinal stenosis and mild lateral recess narrowing. Mild bilateral foraminal stenosis.  L3-L4: Mild  broad-based disc osteophyte complex. Severe right and moderate left facet arthropathy with ligamentum flavum infolding. Mild spinal stenosis. Moderate right lateral recess stenosis. No left foraminal stenosis. Severe right foraminal stenosis.  L4-L5: Mild broad-based disc bulge. Severe right facet arthropathy and mild left facet arthropathy. Severe right foraminal stenosis. No left foraminal stenosis. Mild right lateral recess stenosis.  L5-S1: Mild broad-based disc bulge. Severe right foraminal stenosis. No left foraminal stenosis. Severe right facet arthropathy. Mild left facet arthropathy. No central canal stenosis.  IMPRESSION: 1. At L2-3 there is a mild broad-based disc bulge. Moderate -severe bilateral facet arthropathy with ligamentum flavum infolding resulting in moderate spinal stenosis and mild lateral recess narrowing. Mild bilateral foraminal stenosis. 2. At L3-4 there is a mild broad-based disc osteophyte complex. Severe right and moderate left facet arthropathy with ligamentum flavum infolding. Mild spinal stenosis. Moderate right lateral recess stenosis. Severe right foraminal stenosis. 3. At L4-5 there is a mild broad-based disc bulge. Severe right facet arthropathy and mild left facet arthropathy. Severe right foraminal stenosis. Mild right lateral recess stenosis. 4. At L5-S1 there is a mild broad-based disc bulge. Severe right foraminal stenosis. Severe right facet arthropathy. Mild left facet arthropathy. 5. At L1-2 there is a mild eccentric left disc osteophyte complex. Moderate bilateral facet arthropathy. Moderate left lateral recess stenosis. Mild left foraminal stenosis. Mild spinal stenosis. 6. At L5-S1 there is a mild broad-based disc bulge. Severe right foraminal stenosis. No left foraminal stenosis. Severe right facet arthropathy. Mild left facet arthropathy.   Electronically Signed   By: Kathreen Devoid   On: 01/28/2015 11:10     Microbiology: Recent Results (from the past 240  hour(s))  Urine culture     Status: None (Preliminary result)   Collection Time: 02/13/15  6:01 AM  Result Value Ref Range Status   Specimen Description URINE, RANDOM  Final   Special Requests NONE  Final   Culture   Final    CULTURE REINCUBATED FOR BETTER GROWTH Performed at [facility]    Report Status PENDING  Incomplete     Labs: Basic Metabolic Panel:  Recent Labs Lab 02/14/15 0549 02/14/15 1112 02/14/15 1536 02/14/15 2008 02/15/15 0514 02/16/15 0548  NA 120*  --  122* 123* 127* 133*  K 4.0  --  4.2 4.1 3.8 3.3*  CL 88*  --  92* 90* 96* 100*  CO2 23  --  25 26 23 24   GLUCOSE 114*  --  133* 123* 103* 97  BUN 12  --  12 12 7 8   CREATININE 0.90  --  1.04 1.01 0.80 0.88  CALCIUM 9.3  --  8.9 8.9 8.8* 8.5*  MG  --  1.8  --   --   --   --    Liver Function Tests: No results for input(s): AST, ALT, ALKPHOS, BILITOT, PROT, ALBUMIN in the last 168 hours. No results for input(s): LIPASE, AMYLASE in the last 168 hours. No results for input(s): AMMONIA in the last 168 hours. CBC:  Recent Labs Lab 02/13/15 0506 02/13/15 0517 02/14/15 1112 02/15/15 0514 02/16/15 0548  WBC 9.4  --  6.1 5.6 4.9  NEUTROABS 7.3  --   --   --   --   HGB 13.1 14.6 11.7* 11.8* 11.5*  HCT 37.6* 43.0 33.6* 33.9* 33.6*  MCV 94.5  --  94.1 95.5 94.9  PLT 207  --  183 173 179   Cardiac Enzymes: No results for input(s): CKTOTAL, CKMB, CKMBINDEX, TROPONINI in the last 168 hours. BNP: Invalid input(s): POCBNP CBG: No results for input(s): GLUCAP in the last 168 hours.  Time coordinating discharge:  Greater than 30 minutes  Signed:  Ceana Fiala, DO [facility] Pager: [phone] 02/16/2015, 9:13 AM

## 2015-02-16 NOTE — Progress Notes (Signed)
02/16/15 Patient to be discharged home today, IV site removed, and discharge instructions reviewed with patient and family.

## 2015-02-17 LAB — URINE CULTURE: Culture: >=100000

## 2015-02-19 DIAGNOSIS — S22089D Unspecified fracture of T11-T12 vertebra, subsequent encounter for fracture with routine healing: Secondary | ICD-10-CM | POA: Diagnosis not present

## 2015-02-19 DIAGNOSIS — Z85528 Personal history of other malignant neoplasm of kidney: Secondary | ICD-10-CM | POA: Diagnosis not present

## 2015-02-19 DIAGNOSIS — Z87440 Personal history of urinary (tract) infections: Secondary | ICD-10-CM | POA: Diagnosis not present

## 2015-02-19 DIAGNOSIS — Z8505 Personal history of malignant neoplasm of liver: Secondary | ICD-10-CM | POA: Diagnosis not present

## 2015-02-19 DIAGNOSIS — E785 Hyperlipidemia, unspecified: Secondary | ICD-10-CM | POA: Diagnosis not present

## 2015-02-19 DIAGNOSIS — Z9181 History of falling: Secondary | ICD-10-CM | POA: Diagnosis not present

## 2015-02-19 DIAGNOSIS — M150 Primary generalized (osteo)arthritis: Secondary | ICD-10-CM | POA: Diagnosis not present

## 2015-02-20 DIAGNOSIS — R42 Dizziness and giddiness: Secondary | ICD-10-CM | POA: Diagnosis not present

## 2015-02-20 DIAGNOSIS — Z8505 Personal history of malignant neoplasm of liver: Secondary | ICD-10-CM | POA: Diagnosis not present

## 2015-02-20 DIAGNOSIS — E871 Hypo-osmolality and hyponatremia: Secondary | ICD-10-CM | POA: Diagnosis not present

## 2015-02-20 DIAGNOSIS — M545 Low back pain: Secondary | ICD-10-CM | POA: Diagnosis not present

## 2015-02-20 DIAGNOSIS — S22080D Wedge compression fracture of T11-T12 vertebra, subsequent encounter for fracture with routine healing: Secondary | ICD-10-CM | POA: Diagnosis not present

## 2015-02-20 DIAGNOSIS — S2242XD Multiple fractures of ribs, left side, subsequent encounter for fracture with routine healing: Secondary | ICD-10-CM | POA: Diagnosis not present

## 2015-02-20 DIAGNOSIS — R05 Cough: Secondary | ICD-10-CM | POA: Diagnosis not present

## 2015-02-20 DIAGNOSIS — R350 Frequency of micturition: Secondary | ICD-10-CM | POA: Diagnosis not present

## 2015-02-20 DIAGNOSIS — N400 Enlarged prostate without lower urinary tract symptoms: Secondary | ICD-10-CM | POA: Diagnosis not present

## 2015-02-21 DIAGNOSIS — Z85528 Personal history of other malignant neoplasm of kidney: Secondary | ICD-10-CM | POA: Diagnosis not present

## 2015-02-21 DIAGNOSIS — Z9181 History of falling: Secondary | ICD-10-CM | POA: Diagnosis not present

## 2015-02-21 DIAGNOSIS — Z87440 Personal history of urinary (tract) infections: Secondary | ICD-10-CM | POA: Diagnosis not present

## 2015-02-21 DIAGNOSIS — E785 Hyperlipidemia, unspecified: Secondary | ICD-10-CM | POA: Diagnosis not present

## 2015-02-21 DIAGNOSIS — M150 Primary generalized (osteo)arthritis: Secondary | ICD-10-CM | POA: Diagnosis not present

## 2015-02-21 DIAGNOSIS — S22089D Unspecified fracture of T11-T12 vertebra, subsequent encounter for fracture with routine healing: Secondary | ICD-10-CM | POA: Diagnosis not present

## 2015-02-23 DIAGNOSIS — M47817 Spondylosis without myelopathy or radiculopathy, lumbosacral region: Secondary | ICD-10-CM | POA: Insufficient documentation

## 2015-02-23 DIAGNOSIS — M5136 Other intervertebral disc degeneration, lumbar region: Secondary | ICD-10-CM | POA: Insufficient documentation

## 2015-02-23 DIAGNOSIS — M4696 Unspecified inflammatory spondylopathy, lumbar region: Secondary | ICD-10-CM | POA: Diagnosis not present

## 2015-02-23 DIAGNOSIS — S22000A Wedge compression fracture of unspecified thoracic vertebra, initial encounter for closed fracture: Secondary | ICD-10-CM | POA: Diagnosis not present

## 2015-02-24 DIAGNOSIS — S22089D Unspecified fracture of T11-T12 vertebra, subsequent encounter for fracture with routine healing: Secondary | ICD-10-CM | POA: Diagnosis not present

## 2015-02-24 DIAGNOSIS — Z87440 Personal history of urinary (tract) infections: Secondary | ICD-10-CM | POA: Diagnosis not present

## 2015-02-24 DIAGNOSIS — Z9181 History of falling: Secondary | ICD-10-CM | POA: Diagnosis not present

## 2015-02-24 DIAGNOSIS — M150 Primary generalized (osteo)arthritis: Secondary | ICD-10-CM | POA: Diagnosis not present

## 2015-02-24 DIAGNOSIS — E785 Hyperlipidemia, unspecified: Secondary | ICD-10-CM | POA: Diagnosis not present

## 2015-02-24 DIAGNOSIS — Z85528 Personal history of other malignant neoplasm of kidney: Secondary | ICD-10-CM | POA: Diagnosis not present

## 2015-02-27 DIAGNOSIS — S22089D Unspecified fracture of T11-T12 vertebra, subsequent encounter for fracture with routine healing: Secondary | ICD-10-CM | POA: Diagnosis not present

## 2015-02-27 DIAGNOSIS — M150 Primary generalized (osteo)arthritis: Secondary | ICD-10-CM | POA: Diagnosis not present

## 2015-02-27 DIAGNOSIS — Z9181 History of falling: Secondary | ICD-10-CM | POA: Diagnosis not present

## 2015-02-27 DIAGNOSIS — E785 Hyperlipidemia, unspecified: Secondary | ICD-10-CM | POA: Diagnosis not present

## 2015-02-27 DIAGNOSIS — Z87440 Personal history of urinary (tract) infections: Secondary | ICD-10-CM | POA: Diagnosis not present

## 2015-02-27 DIAGNOSIS — Z85528 Personal history of other malignant neoplasm of kidney: Secondary | ICD-10-CM | POA: Diagnosis not present

## 2015-02-28 DIAGNOSIS — N390 Urinary tract infection, site not specified: Secondary | ICD-10-CM | POA: Diagnosis not present

## 2015-02-28 DIAGNOSIS — R351 Nocturia: Secondary | ICD-10-CM | POA: Diagnosis not present

## 2015-03-01 DIAGNOSIS — Z9181 History of falling: Secondary | ICD-10-CM | POA: Diagnosis not present

## 2015-03-01 DIAGNOSIS — S22089D Unspecified fracture of T11-T12 vertebra, subsequent encounter for fracture with routine healing: Secondary | ICD-10-CM | POA: Diagnosis not present

## 2015-03-01 DIAGNOSIS — Z85528 Personal history of other malignant neoplasm of kidney: Secondary | ICD-10-CM | POA: Diagnosis not present

## 2015-03-01 DIAGNOSIS — M150 Primary generalized (osteo)arthritis: Secondary | ICD-10-CM | POA: Diagnosis not present

## 2015-03-01 DIAGNOSIS — E785 Hyperlipidemia, unspecified: Secondary | ICD-10-CM | POA: Diagnosis not present

## 2015-03-01 DIAGNOSIS — Z87440 Personal history of urinary (tract) infections: Secondary | ICD-10-CM | POA: Diagnosis not present

## 2015-03-03 DIAGNOSIS — Z87440 Personal history of urinary (tract) infections: Secondary | ICD-10-CM | POA: Diagnosis not present

## 2015-03-03 DIAGNOSIS — S22089D Unspecified fracture of T11-T12 vertebra, subsequent encounter for fracture with routine healing: Secondary | ICD-10-CM | POA: Diagnosis not present

## 2015-03-03 DIAGNOSIS — Z85528 Personal history of other malignant neoplasm of kidney: Secondary | ICD-10-CM | POA: Diagnosis not present

## 2015-03-03 DIAGNOSIS — Z9181 History of falling: Secondary | ICD-10-CM | POA: Diagnosis not present

## 2015-03-03 DIAGNOSIS — E785 Hyperlipidemia, unspecified: Secondary | ICD-10-CM | POA: Diagnosis not present

## 2015-03-03 DIAGNOSIS — M150 Primary generalized (osteo)arthritis: Secondary | ICD-10-CM | POA: Diagnosis not present

## 2015-03-05 DIAGNOSIS — Z85528 Personal history of other malignant neoplasm of kidney: Secondary | ICD-10-CM | POA: Diagnosis not present

## 2015-03-05 DIAGNOSIS — M150 Primary generalized (osteo)arthritis: Secondary | ICD-10-CM | POA: Diagnosis not present

## 2015-03-05 DIAGNOSIS — Z9181 History of falling: Secondary | ICD-10-CM | POA: Diagnosis not present

## 2015-03-05 DIAGNOSIS — S22089D Unspecified fracture of T11-T12 vertebra, subsequent encounter for fracture with routine healing: Secondary | ICD-10-CM | POA: Diagnosis not present

## 2015-03-05 DIAGNOSIS — E785 Hyperlipidemia, unspecified: Secondary | ICD-10-CM | POA: Diagnosis not present

## 2015-03-05 DIAGNOSIS — Z87440 Personal history of urinary (tract) infections: Secondary | ICD-10-CM | POA: Diagnosis not present

## 2015-03-07 DIAGNOSIS — Z85528 Personal history of other malignant neoplasm of kidney: Secondary | ICD-10-CM | POA: Diagnosis not present

## 2015-03-07 DIAGNOSIS — Z87440 Personal history of urinary (tract) infections: Secondary | ICD-10-CM | POA: Diagnosis not present

## 2015-03-07 DIAGNOSIS — Z9181 History of falling: Secondary | ICD-10-CM | POA: Diagnosis not present

## 2015-03-07 DIAGNOSIS — S22089D Unspecified fracture of T11-T12 vertebra, subsequent encounter for fracture with routine healing: Secondary | ICD-10-CM | POA: Diagnosis not present

## 2015-03-07 DIAGNOSIS — E785 Hyperlipidemia, unspecified: Secondary | ICD-10-CM | POA: Diagnosis not present

## 2015-03-07 DIAGNOSIS — M150 Primary generalized (osteo)arthritis: Secondary | ICD-10-CM | POA: Diagnosis not present

## 2015-03-13 DIAGNOSIS — S22089D Unspecified fracture of T11-T12 vertebra, subsequent encounter for fracture with routine healing: Secondary | ICD-10-CM | POA: Diagnosis not present

## 2015-03-13 DIAGNOSIS — E785 Hyperlipidemia, unspecified: Secondary | ICD-10-CM | POA: Diagnosis not present

## 2015-03-13 DIAGNOSIS — Z87440 Personal history of urinary (tract) infections: Secondary | ICD-10-CM | POA: Diagnosis not present

## 2015-03-13 DIAGNOSIS — M150 Primary generalized (osteo)arthritis: Secondary | ICD-10-CM | POA: Diagnosis not present

## 2015-03-13 DIAGNOSIS — Z85528 Personal history of other malignant neoplasm of kidney: Secondary | ICD-10-CM | POA: Diagnosis not present

## 2015-03-13 DIAGNOSIS — Z9181 History of falling: Secondary | ICD-10-CM | POA: Diagnosis not present

## 2015-03-15 DIAGNOSIS — Z87440 Personal history of urinary (tract) infections: Secondary | ICD-10-CM | POA: Diagnosis not present

## 2015-03-15 DIAGNOSIS — M150 Primary generalized (osteo)arthritis: Secondary | ICD-10-CM | POA: Diagnosis not present

## 2015-03-15 DIAGNOSIS — Z9181 History of falling: Secondary | ICD-10-CM | POA: Diagnosis not present

## 2015-03-15 DIAGNOSIS — S22089D Unspecified fracture of T11-T12 vertebra, subsequent encounter for fracture with routine healing: Secondary | ICD-10-CM | POA: Diagnosis not present

## 2015-03-15 DIAGNOSIS — E785 Hyperlipidemia, unspecified: Secondary | ICD-10-CM | POA: Diagnosis not present

## 2015-03-15 DIAGNOSIS — Z85528 Personal history of other malignant neoplasm of kidney: Secondary | ICD-10-CM | POA: Diagnosis not present

## 2015-03-17 DIAGNOSIS — S22000A Wedge compression fracture of unspecified thoracic vertebra, initial encounter for closed fracture: Secondary | ICD-10-CM | POA: Diagnosis not present

## 2015-03-17 DIAGNOSIS — S22000D Wedge compression fracture of unspecified thoracic vertebra, subsequent encounter for fracture with routine healing: Secondary | ICD-10-CM | POA: Diagnosis not present

## 2015-03-17 DIAGNOSIS — M5136 Other intervertebral disc degeneration, lumbar region: Secondary | ICD-10-CM | POA: Diagnosis not present

## 2015-03-17 DIAGNOSIS — M4696 Unspecified inflammatory spondylopathy, lumbar region: Secondary | ICD-10-CM | POA: Diagnosis not present

## 2015-03-20 DIAGNOSIS — Z87440 Personal history of urinary (tract) infections: Secondary | ICD-10-CM | POA: Diagnosis not present

## 2015-03-20 DIAGNOSIS — M150 Primary generalized (osteo)arthritis: Secondary | ICD-10-CM | POA: Diagnosis not present

## 2015-03-20 DIAGNOSIS — Z9181 History of falling: Secondary | ICD-10-CM | POA: Diagnosis not present

## 2015-03-20 DIAGNOSIS — Z85528 Personal history of other malignant neoplasm of kidney: Secondary | ICD-10-CM | POA: Diagnosis not present

## 2015-03-20 DIAGNOSIS — E785 Hyperlipidemia, unspecified: Secondary | ICD-10-CM | POA: Diagnosis not present

## 2015-03-20 DIAGNOSIS — S22089D Unspecified fracture of T11-T12 vertebra, subsequent encounter for fracture with routine healing: Secondary | ICD-10-CM | POA: Diagnosis not present

## 2015-03-22 DIAGNOSIS — E785 Hyperlipidemia, unspecified: Secondary | ICD-10-CM | POA: Diagnosis not present

## 2015-03-22 DIAGNOSIS — M150 Primary generalized (osteo)arthritis: Secondary | ICD-10-CM | POA: Diagnosis not present

## 2015-03-22 DIAGNOSIS — S22089D Unspecified fracture of T11-T12 vertebra, subsequent encounter for fracture with routine healing: Secondary | ICD-10-CM | POA: Diagnosis not present

## 2015-03-22 DIAGNOSIS — Z9181 History of falling: Secondary | ICD-10-CM | POA: Diagnosis not present

## 2015-03-22 DIAGNOSIS — Z85528 Personal history of other malignant neoplasm of kidney: Secondary | ICD-10-CM | POA: Diagnosis not present

## 2015-03-22 DIAGNOSIS — Z87440 Personal history of urinary (tract) infections: Secondary | ICD-10-CM | POA: Diagnosis not present

## 2015-03-24 DIAGNOSIS — R29890 Loss of height: Secondary | ICD-10-CM | POA: Diagnosis not present

## 2015-03-24 DIAGNOSIS — S22000A Wedge compression fracture of unspecified thoracic vertebra, initial encounter for closed fracture: Secondary | ICD-10-CM | POA: Diagnosis not present

## 2015-03-24 DIAGNOSIS — N281 Cyst of kidney, acquired: Secondary | ICD-10-CM | POA: Diagnosis not present

## 2015-03-24 DIAGNOSIS — M4854XA Collapsed vertebra, not elsewhere classified, thoracic region, initial encounter for fracture: Secondary | ICD-10-CM | POA: Diagnosis not present

## 2015-03-24 DIAGNOSIS — M4316 Spondylolisthesis, lumbar region: Secondary | ICD-10-CM | POA: Diagnosis not present

## 2015-03-24 DIAGNOSIS — M4802 Spinal stenosis, cervical region: Secondary | ICD-10-CM | POA: Diagnosis not present

## 2015-03-24 DIAGNOSIS — M47814 Spondylosis without myelopathy or radiculopathy, thoracic region: Secondary | ICD-10-CM | POA: Diagnosis not present

## 2015-03-27 DIAGNOSIS — S22089D Unspecified fracture of T11-T12 vertebra, subsequent encounter for fracture with routine healing: Secondary | ICD-10-CM | POA: Diagnosis not present

## 2015-03-27 DIAGNOSIS — Z85528 Personal history of other malignant neoplasm of kidney: Secondary | ICD-10-CM | POA: Diagnosis not present

## 2015-03-27 DIAGNOSIS — E785 Hyperlipidemia, unspecified: Secondary | ICD-10-CM | POA: Diagnosis not present

## 2015-03-27 DIAGNOSIS — M150 Primary generalized (osteo)arthritis: Secondary | ICD-10-CM | POA: Diagnosis not present

## 2015-03-27 DIAGNOSIS — Z87440 Personal history of urinary (tract) infections: Secondary | ICD-10-CM | POA: Diagnosis not present

## 2015-03-27 DIAGNOSIS — Z9181 History of falling: Secondary | ICD-10-CM | POA: Diagnosis not present

## 2015-03-30 DIAGNOSIS — Z9181 History of falling: Secondary | ICD-10-CM | POA: Diagnosis not present

## 2015-03-30 DIAGNOSIS — N390 Urinary tract infection, site not specified: Secondary | ICD-10-CM | POA: Diagnosis not present

## 2015-03-30 DIAGNOSIS — Z85528 Personal history of other malignant neoplasm of kidney: Secondary | ICD-10-CM | POA: Diagnosis not present

## 2015-03-30 DIAGNOSIS — S22089D Unspecified fracture of T11-T12 vertebra, subsequent encounter for fracture with routine healing: Secondary | ICD-10-CM | POA: Diagnosis not present

## 2015-03-30 DIAGNOSIS — M150 Primary generalized (osteo)arthritis: Secondary | ICD-10-CM | POA: Diagnosis not present

## 2015-03-30 DIAGNOSIS — E785 Hyperlipidemia, unspecified: Secondary | ICD-10-CM | POA: Diagnosis not present

## 2015-03-30 DIAGNOSIS — Z87440 Personal history of urinary (tract) infections: Secondary | ICD-10-CM | POA: Diagnosis not present

## 2015-03-30 DIAGNOSIS — R339 Retention of urine, unspecified: Secondary | ICD-10-CM | POA: Diagnosis not present

## 2015-03-30 DIAGNOSIS — R351 Nocturia: Secondary | ICD-10-CM | POA: Diagnosis not present

## 2015-03-31 ENCOUNTER — Other Ambulatory Visit: Payer: Self-pay | Admitting: Sports Medicine

## 2015-03-31 DIAGNOSIS — S22000B Wedge compression fracture of unspecified thoracic vertebra, initial encounter for open fracture: Secondary | ICD-10-CM

## 2015-04-03 DIAGNOSIS — Z87440 Personal history of urinary (tract) infections: Secondary | ICD-10-CM | POA: Diagnosis not present

## 2015-04-03 DIAGNOSIS — M150 Primary generalized (osteo)arthritis: Secondary | ICD-10-CM | POA: Diagnosis not present

## 2015-04-03 DIAGNOSIS — Z9181 History of falling: Secondary | ICD-10-CM | POA: Diagnosis not present

## 2015-04-03 DIAGNOSIS — Z85528 Personal history of other malignant neoplasm of kidney: Secondary | ICD-10-CM | POA: Diagnosis not present

## 2015-04-03 DIAGNOSIS — S22089D Unspecified fracture of T11-T12 vertebra, subsequent encounter for fracture with routine healing: Secondary | ICD-10-CM | POA: Diagnosis not present

## 2015-04-03 DIAGNOSIS — E785 Hyperlipidemia, unspecified: Secondary | ICD-10-CM | POA: Diagnosis not present

## 2015-04-06 ENCOUNTER — Ambulatory Visit
Admission: RE | Admit: 2015-04-06 | Discharge: 2015-04-06 | Disposition: A | Discharge status: Home / Self Care | Payer: Medicare Other | Source: Ambulatory Visit | Attending: Sports Medicine | Admitting: Sports Medicine

## 2015-04-06 DIAGNOSIS — S22000A Wedge compression fracture of unspecified thoracic vertebra, initial encounter for closed fracture: Secondary | ICD-10-CM | POA: Insufficient documentation

## 2015-04-06 DIAGNOSIS — S22080A Wedge compression fracture of T11-T12 vertebra, initial encounter for closed fracture: Secondary | ICD-10-CM | POA: Diagnosis not present

## 2015-04-06 DIAGNOSIS — S22000B Wedge compression fracture of unspecified thoracic vertebra, initial encounter for open fracture: Secondary | ICD-10-CM

## 2015-04-06 NOTE — Consult Note (Signed)
Chief Complaint: Patient was seen in consultation today for  Chief Complaint  Patient presents with  . Advice Only    Consult for Kyphoplasty   at the request of Begovich,John Emil  Referring Physician(s): Begovich,John Emil  History of Present Illness: Corey Davila is a 81 y.o. male with past medical history significant for renal cell carcinoma, hepatocellular carcinoma and sleep apnea who presents to the interventional radiology clinic for evaluation of percutaneous management of symptomatic T11 compression fracture. The patient is accompanied by his wife and daughter though serves as his own historian.   The patient originally suffered a fall in March of this year while doing yard work, experiencing an acute on chronic exacerbation of his low back pain at that time. CT of the chest, abdomen and pelvis was performed on 10/08/2014 and was negative for thoracic or lumbar vertebral body fracture though did demonstrate several left-sided rib fractures at that time.   The patient experienced an additional fall in the beginning of August with chest CT performed 02/13/2015 demonstrating development of an acute mild-to-moderate compression deformity involving the superior endplate of the O35 vertebral body.  At that time, the patient was treated with conservative management including placement of a back brace and non-steroidal anti-inflammatory agents. Note, given the patient's advanced age, he does not wish to take opioid analgesics. Unfortunately, the patient's back pain has persisted and thoracic spine MRI performed 03/24/2015 demonstrated continued progression of the now moderate from compression deformity involving the superior endplate of K09.  The patient states that his pain is 0/10 at rest though worsens to 2-3/10 with movement and activity and occasionally worsens to his much as 8/10 with strenuous activity, especially during his physical therapy appointments. The patient denies lower  extremity weakness or sensory loss. No change in bowel or bladder functions. No fever or chills. The patient is otherwise without complaint.  Past Medical History  Diagnosis Date  . Mixed hyperlipidemia   . Pneumonia ~ 2012  . Sleep apnea     "doesn't wear mask" (02/13/2015)  . Hearing loss of both ears   . Arthritis     "hands, back" (02/13/2015)  . Chronic lower back pain     "since 02/2014" (02/13/2015)  . Liver cancer     partial liver resection 06/18/2007; partial liver resection 08/29/2010, not renal  . Cancer of kidney 06/19/1979    L renal taken out     Past Surgical History  Procedure Laterality Date  . Nephrectomy Left 1980    [place] -cancer  . Open partial hepatectomy [83]  06/18/07    "75%", [facility]  R lobe-cancer  . Open partial hepatectomy [83]  08/29/2010    "20%", [facility]  L lobe  . Colonoscopy    . Carpal tunnel release Left 09/10/2013    Procedure: LEFT CARPAL TUNNEL RELEASE;  Surgeon: Cammie Sickle., MD;  Location: [facility];  Service: Orthopedics;  Laterality: Left;    Allergies: Tessalon; Tussicaps; Amoxicillin; Bee venom; Dilantin; Hornet venom; Hydrocodone-chlorpheniramine; Mevacor; Niacin and related; Prednisone; and Septra ds  Medications: Prior to Admission medications   Medication Sig Start Date End Date Taking? Authorizing Provider  aspirin 81 MG tablet Take 81 mg by mouth daily.   Yes Historical Provider, MD  Calcium Citrate 200 MG TABS Take 1 tablet by mouth daily. 08/21/10  Yes Historical Provider, MD  Calcium-Vit D3-Phytosterols [phone] MG-UNIT-MG TABS Take 1 tablet by mouth daily. 04/21/07  Yes Historical Provider, MD  CRESTOR 20 MG tablet Take 1 tablet by mouth every other day.   Yes Historical Provider, MD  docusate sodium (COLACE) 100 MG capsule Take 100 mg by mouth daily. 08/21/10  Yes Historical Provider, MD  finasteride (PROSCAR) 5 MG tablet Take 1 tablet by mouth daily.   Yes Historical Provider, MD    Glucosamine-Chondroitin 250-200 MG TABS Take 1 tablet by mouth daily. Regular strength tabs. 04/21/07  Yes Historical Provider, MD  ibuprofen (ADVIL,MOTRIN) 200 MG tablet Take 600 mg by mouth 2 (two) times daily.   Yes Historical Provider, MD  meclizine (ANTIVERT) 12.5 MG tablet Take by mouth 3 (three) times daily as needed for dizziness.  12/01/14  Yes Historical Provider, MD  Misc Natural Products (OSTEO BI-FLEX JOINT SHIELD PO) Take 1 tablet by mouth 2 (two) times daily.   Yes Historical Provider, MD  Multiple Minerals-Vitamins (CALCIUM CITRATE PLUS/MAGNESIUM PO) Take 1 tablet by mouth daily.   Yes Historical Provider, MD  vitamin B-12 (CYANOCOBALAMIN) 250 MCG tablet Take 250 mcg by mouth daily.   Yes Historical Provider, MD  LYCOPENE PO Take 1 tablet by mouth daily. 08/21/10   Historical Provider, MD  traMADol (ULTRAM) 50 MG tablet Take 1 tablet (50 mg total) by mouth every 6 (six) hours as needed. Patient not taking: Reported on 04/06/2015 10/09/14   Jonetta Osgood, MD     Family History  Problem Relation Age of Onset  . Stroke Father     Social History   Social History  . Marital Status: Married    Spouse Name: N/A  . Number of Children: N/A  . Years of Education: N/A   Occupational History  . 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 Smoking status: Never Smoker   . Smokeless tobacco: Never Used  . Alcohol Use: No  . Drug Use: No  . Sexual Activity: Not on file   Other Topics Concern  . Not on file   Social History Narrative   ECOG Status: 1 - Symptomatic but completely ambulatory  Review of Systems: A 12 point ROS discussed and pertinent positives are indicated in the HPI above.  All other systems are negative.  Review of Systems  Constitutional: Negative.   Respiratory: Negative.   Cardiovascular: Negative.   Genitourinary: Negative.   Musculoskeletal: Positive for back pain.  Neurological: Negative for weakness and numbness.   Psychiatric/Behavioral: Negative.     Vital Signs: BP 128/77 mmHg  Pulse 64  Temp(Src) 97.8 F (36.6 C) (Oral)  Resp 14  SpO2 98%  Physical Exam  Constitutional: He appears well-developed and well-nourished.  Appears younger than his stated age.  Cardiovascular: Normal rate and regular rhythm.   Pulmonary/Chest: Effort normal and breath sounds normal.  Abdominal: Soft. Bowel sounds are normal.  Musculoskeletal:       Arms: Pt reports 6/10 pain with direct palpation of the lower thoracic spine.  Neurological: He has normal strength.     Imaging:  CT of the chest, abdomen and pelvis - 10/08/2014; lumbar spine MRI - 01/27/2014; Chest CT - 02/13/2015; Thoracic spine MRI - 03/24/2015  Thoracic spine MRI performed 03/24/2015 demonstrates progression of now moderate compression deformity involving the superior endplate of the K93 vertebral body with persistent STIR signal abnormality. There is no significant retropulsion.  Labs:  CBC:  Recent Labs  02/13/15 0506 02/13/15 0517 02/14/15 1112 02/15/15 0514 02/16/15 0548  WBC 9.4  --  6.1 5.6 4.9  HGB 13.1 14.6 11.7* 11.8* 11.5*  HCT 37.6* 43.0 33.6* 33.9* 33.6*  PLT 207  --  183 173 179    COAGS: No results for input(s): INR, APTT in the last 8760 hours.  BMP:  Recent Labs  02/14/15 1536 02/14/15 2008 02/15/15 0514 02/16/15 0548  NA 122* 123* 127* 133*  K 4.2 4.1 3.8 3.3*  CL 92* 90* 96* 100*  CO2 25 26 23 24   GLUCOSE 133* 123* 103* 97  BUN 12 12 7 8   CALCIUM 8.9 8.9 8.8* 8.5*  CREATININE 1.04 1.01 0.80 0.88  GFRNONAA >60 >60 >60 >60  GFRAA >60 >60 >60 >60    Assessment and Plan:  Corey Davila is a 81 y.o. male with past medical history significant for renal cell carcinoma, hepatocellular carcinoma and sleep apnea who presents to the interventional radiology clinic for evaluation of percutaneous management of symptomatic T11 compression fracture.   After suffering a fall in the beginning of August, chest  CT performed 02/13/2015 demonstrated development of an acute mild-to-moderate compression deformity involving the superior endplate of the V67 vertebral body. At that time, the patient was treated with conservative management including placement of a back brace and non-steroidal anti-inflammatory agents, however unfortunately, the patient's back pain has persisted and thoracic spine MRI performed 03/24/2015 demonstrated continued progression of the now moderate from compression deformity involving the superior endplate of M09.  While the patient reports no pain at rest and minimal pain with activity, he is focally symptomatic with direct palpation involving the lower thoracic spine which reproduces his most severe back pain which he rates at 6-8/10. Again, the patient denies neurologic symptoms, specifically, no lower extremity weakness, paresthesias or change in bowel or bladder function.  Given the focally reproducible back pain with direct palpation at the level of the patient's known compression fracture, I feel this patient is a good candidate for fluoroscopic guided vertebral body augmentation.  Risks and Benefits discussed with the patient including, but not limited to education regarding the natural healing process of compression fractures without intervention, bleeding, infection, cement migration which may cause spinal cord damage, paralysis, pulmonary embolism or even death.  Prolonged conversations were held with the patient and the patient's family regarding appropriate expectations for vertebral body augmentation. Specifically, the vertebral body augmentation will be performed to help alleviate his fracture related back pain and in no way will address his known pre-existing degenerative back pain. All of the patient's questions were answered, patient is agreeable to proceed.  Pending insurance approval, this procedure will be performed at [facility] on 04/18/2015 on an outpatient  basis.  Thank you for this interesting consult.  I greatly enjoyed meeting Corey Davila and look forward to participating in his care.  A copy of this report was sent to the requesting provider on this date.  SignedSandi Mariscal 04/06/2015, 4:17 PM   I spent a total of 30 Minutes in face to face in clinical consultation, greater than 50% of which was counseling/coordinating care for percutaneous treatment options for symptomatic T11 compression fracture.

## 2015-04-07 ENCOUNTER — Other Ambulatory Visit: Payer: Self-pay | Admitting: Interventional Radiology

## 2015-04-07 DIAGNOSIS — S22000A Wedge compression fracture of unspecified thoracic vertebra, initial encounter for closed fracture: Secondary | ICD-10-CM

## 2015-04-07 DIAGNOSIS — Z87440 Personal history of urinary (tract) infections: Secondary | ICD-10-CM | POA: Diagnosis not present

## 2015-04-07 DIAGNOSIS — E785 Hyperlipidemia, unspecified: Secondary | ICD-10-CM | POA: Diagnosis not present

## 2015-04-07 DIAGNOSIS — Z85528 Personal history of other malignant neoplasm of kidney: Secondary | ICD-10-CM | POA: Diagnosis not present

## 2015-04-07 DIAGNOSIS — S22089D Unspecified fracture of T11-T12 vertebra, subsequent encounter for fracture with routine healing: Secondary | ICD-10-CM | POA: Diagnosis not present

## 2015-04-07 DIAGNOSIS — M150 Primary generalized (osteo)arthritis: Secondary | ICD-10-CM | POA: Diagnosis not present

## 2015-04-07 DIAGNOSIS — Z9181 History of falling: Secondary | ICD-10-CM | POA: Diagnosis not present

## 2015-04-10 DIAGNOSIS — Z9181 History of falling: Secondary | ICD-10-CM | POA: Diagnosis not present

## 2015-04-10 DIAGNOSIS — E785 Hyperlipidemia, unspecified: Secondary | ICD-10-CM | POA: Diagnosis not present

## 2015-04-10 DIAGNOSIS — M150 Primary generalized (osteo)arthritis: Secondary | ICD-10-CM | POA: Diagnosis not present

## 2015-04-10 DIAGNOSIS — Z87440 Personal history of urinary (tract) infections: Secondary | ICD-10-CM | POA: Diagnosis not present

## 2015-04-10 DIAGNOSIS — S22089D Unspecified fracture of T11-T12 vertebra, subsequent encounter for fracture with routine healing: Secondary | ICD-10-CM | POA: Diagnosis not present

## 2015-04-10 DIAGNOSIS — Z85528 Personal history of other malignant neoplasm of kidney: Secondary | ICD-10-CM | POA: Diagnosis not present

## 2015-04-12 DIAGNOSIS — R351 Nocturia: Secondary | ICD-10-CM | POA: Diagnosis not present

## 2015-04-12 DIAGNOSIS — R350 Frequency of micturition: Secondary | ICD-10-CM | POA: Diagnosis not present

## 2015-04-14 DIAGNOSIS — C220 Liver cell carcinoma: Secondary | ICD-10-CM | POA: Diagnosis not present

## 2015-04-14 DIAGNOSIS — Z0389 Encounter for observation for other suspected diseases and conditions ruled out: Secondary | ICD-10-CM | POA: Diagnosis not present

## 2015-04-14 DIAGNOSIS — C228 Malignant neoplasm of liver, primary, unspecified as to type: Secondary | ICD-10-CM | POA: Diagnosis not present

## 2015-04-14 DIAGNOSIS — M4854XA Collapsed vertebra, not elsewhere classified, thoracic region, initial encounter for fracture: Secondary | ICD-10-CM | POA: Diagnosis not present

## 2015-04-17 ENCOUNTER — Other Ambulatory Visit: Payer: Self-pay | Admitting: Radiology

## 2015-04-17 DIAGNOSIS — S22089D Unspecified fracture of T11-T12 vertebra, subsequent encounter for fracture with routine healing: Secondary | ICD-10-CM | POA: Diagnosis not present

## 2015-04-17 DIAGNOSIS — E785 Hyperlipidemia, unspecified: Secondary | ICD-10-CM | POA: Diagnosis not present

## 2015-04-17 DIAGNOSIS — M150 Primary generalized (osteo)arthritis: Secondary | ICD-10-CM | POA: Diagnosis not present

## 2015-04-17 DIAGNOSIS — Z87440 Personal history of urinary (tract) infections: Secondary | ICD-10-CM | POA: Diagnosis not present

## 2015-04-17 DIAGNOSIS — Z85528 Personal history of other malignant neoplasm of kidney: Secondary | ICD-10-CM | POA: Diagnosis not present

## 2015-04-17 DIAGNOSIS — Z9181 History of falling: Secondary | ICD-10-CM | POA: Diagnosis not present

## 2015-04-18 ENCOUNTER — Ambulatory Visit (HOSPITAL_COMMUNITY)
Admission: RE | Admit: 2015-04-18 | Discharge: 2015-04-18 | Disposition: A | Discharge status: Home / Self Care | Payer: Medicare Other | Source: Ambulatory Visit | Attending: Interventional Radiology | Admitting: Interventional Radiology

## 2015-04-18 ENCOUNTER — Encounter (HOSPITAL_COMMUNITY): Payer: Self-pay

## 2015-04-18 DIAGNOSIS — Z7982 Long term (current) use of aspirin: Secondary | ICD-10-CM | POA: Insufficient documentation

## 2015-04-18 DIAGNOSIS — Z8553 Personal history of malignant neoplasm of renal pelvis: Secondary | ICD-10-CM | POA: Insufficient documentation

## 2015-04-18 DIAGNOSIS — M4854XA Collapsed vertebra, not elsewhere classified, thoracic region, initial encounter for fracture: Secondary | ICD-10-CM | POA: Insufficient documentation

## 2015-04-18 DIAGNOSIS — W19XXXA Unspecified fall, initial encounter: Secondary | ICD-10-CM | POA: Diagnosis not present

## 2015-04-18 DIAGNOSIS — S22000A Wedge compression fracture of unspecified thoracic vertebra, initial encounter for closed fracture: Secondary | ICD-10-CM

## 2015-04-18 DIAGNOSIS — Z8505 Personal history of malignant neoplasm of liver: Secondary | ICD-10-CM | POA: Insufficient documentation

## 2015-04-18 DIAGNOSIS — S22080A Wedge compression fracture of T11-T12 vertebra, initial encounter for closed fracture: Secondary | ICD-10-CM | POA: Diagnosis not present

## 2015-04-18 DIAGNOSIS — M4624 Osteomyelitis of vertebra, thoracic region: Secondary | ICD-10-CM | POA: Diagnosis not present

## 2015-04-18 LAB — CBC WITH DIFFERENTIAL/PLATELET
BASOS PCT: 1 %
Basophils Absolute: 0 10*3/uL (ref 0.0–0.1)
EOS ABS: 0.1 10*3/uL (ref 0.0–0.7)
Eosinophils Relative: 2 %
HCT: 43 % (ref 39.0–52.0)
HEMOGLOBIN: 14.1 g/dL (ref 13.0–17.0)
Lymphocytes Relative: 39 %
Lymphs Abs: 1.6 10*3/uL (ref 0.7–4.0)
MCH: 32.6 pg (ref 26.0–34.0)
MCHC: 32.8 g/dL (ref 30.0–36.0)
MCV: 99.5 fL (ref 78.0–100.0)
MONOS PCT: 13 %
Monocytes Absolute: 0.5 10*3/uL (ref 0.1–1.0)
NEUTROS PCT: 45 %
Neutro Abs: 1.9 10*3/uL (ref 1.7–7.7)
PLATELETS: 212 10*3/uL (ref 150–400)
RBC: 4.32 MIL/uL (ref 4.22–5.81)
RDW: 13.4 % (ref 11.5–15.5)
WBC: 4.1 10*3/uL (ref 4.0–10.5)

## 2015-04-18 LAB — BASIC METABOLIC PANEL
Anion gap: 4 — ABNORMAL LOW (ref 5–15)
BUN: 18 mg/dL (ref 6–20)
CALCIUM: 9.2 mg/dL (ref 8.9–10.3)
CO2: 28 mmol/L (ref 22–32)
CREATININE: 1.04 mg/dL (ref 0.61–1.24)
Chloride: 105 mmol/L (ref 101–111)
GFR calc non Af Amer: >60 mL/min (ref >60)
Glucose, Bld: 111 mg/dL — ABNORMAL HIGH (ref 65–99)
Potassium: 4.1 mmol/L (ref 3.5–5.1)
SODIUM: 137 mmol/L (ref 135–145)

## 2015-04-18 LAB — PROTIME-INR
INR: 1.1 (ref 0.00–1.49)
PROTHROMBIN TIME: 14.4 s (ref 11.6–15.2)

## 2015-04-18 LAB — APTT: aPTT: 31 seconds (ref 24–37)

## 2015-04-18 MED ORDER — FENTANYL CITRATE (PF) 100 MCG/2ML IJ SOLN
INTRAMUSCULAR | Status: AC | PRN
  Administered 2015-04-18: 50 ug via INTRAVENOUS
  Administered 2015-04-18: 25 ug via INTRAVENOUS

## 2015-04-18 MED ORDER — FENTANYL CITRATE (PF) 100 MCG/2ML IJ SOLN
INTRAMUSCULAR | Status: AC
  Filled 2015-04-18: qty 4

## 2015-04-18 MED ORDER — MIDAZOLAM HCL 2 MG/2ML IJ SOLN
INTRAMUSCULAR | Status: AC | PRN
  Administered 2015-04-18 (×5): 1 mg via INTRAVENOUS

## 2015-04-18 MED ORDER — CLINDAMYCIN PHOSPHATE 600 MG/50ML IV SOLN
600.0000 mg | Freq: Once | INTRAVENOUS | Status: AC
  Administered 2015-04-18: 600 mg via INTRAVENOUS
  Filled 2015-04-18: qty 50

## 2015-04-18 MED ORDER — IOHEXOL 300 MG/ML  SOLN: 20.0000 mL | Freq: Once | INTRAMUSCULAR | Status: DC | PRN

## 2015-04-18 MED ORDER — MIDAZOLAM HCL 2 MG/2ML IJ SOLN
INTRAMUSCULAR | Status: AC
  Filled 2015-04-18: qty 6

## 2015-04-18 MED ORDER — LIDOCAINE HCL (PF) 1 % IJ SOLN
INTRAMUSCULAR | Status: AC
  Filled 2015-04-18: qty 30

## 2015-04-18 MED ORDER — SODIUM CHLORIDE 0.9 % IV SOLN
INTRAVENOUS | Status: DC
  Administered 2015-04-18: 10:00:00 via INTRAVENOUS

## 2015-04-18 NOTE — Procedures (Signed)
Technically successful cement augmentation of the T11 vertebral body. Patient is to lie flat for 3 hours. No immediate post procedural complications.

## 2015-04-18 NOTE — H&P (Signed)
HPI: Patient with acute T11 compression fracture s/p fall and complaints of ongoing back pain despite brace and pain medication trial. He has been seen in full consult on 04/06/15 and scheduled today for Thoracic 11 VP/KP   The patient has had a H&P performed within the last 30 days, all history, medications, and exam have been reviewed. The patient denies any interval changes since the H&P.  Medications: Prior to Admission medications   Medication Sig Start Date End Date Taking? Authorizing Provider  aspirin 81 MG tablet Take 81 mg by mouth daily.   Yes Historical Provider, MD  Calcium Citrate 200 MG TABS Take 1 tablet by mouth daily. 08/21/10  Yes Historical Provider, MD  Calcium-Vit D3-Phytosterols [phone] MG-UNIT-MG TABS Take 1 tablet by mouth daily. 04/21/07  Yes Historical Provider, MD  CRESTOR 20 MG tablet Take 1 tablet by mouth every other day.   Yes Historical Provider, MD  docusate sodium (COLACE) 100 MG capsule Take 100 mg by mouth daily. 08/21/10  Yes Historical Provider, MD  finasteride (PROSCAR) 5 MG tablet Take 1 tablet by mouth daily.   Yes Historical Provider, MD  Glucosamine-Chondroitin 250-200 MG TABS Take 1 tablet by mouth daily. Regular strength tabs. 04/21/07  Yes Historical Provider, MD  ibuprofen (ADVIL,MOTRIN) 200 MG tablet Take 200 mg by mouth every 6 (six) hours as needed.   Yes Historical Provider, MD  Multiple Vitamin (MULTIVITAMIN WITH MINERALS) TABS tablet Take 1 tablet by mouth daily.   Yes Historical Provider, MD  vitamin B-12 (CYANOCOBALAMIN) 250 MCG tablet Take 250 mcg by mouth daily.   Yes Historical Provider, MD  meclizine (ANTIVERT) 12.5 MG tablet Take by mouth 3 (three) times daily as needed for dizziness.  12/01/14   Historical Provider, MD  traMADol (ULTRAM) 50 MG tablet Take 1 tablet (50 mg total) by mouth every 6 (six) hours as needed. Patient not taking: Reported on 04/13/2015 10/09/14   Jonetta Osgood, MD    Vital Signs: BP 139/76 mmHg  Pulse 66   Temp(Src) 98 F (36.7 C) (Oral)  Resp 16  SpO2 100%  Physical Exam  Constitutional: He is oriented to person, place, and time. No distress.  HENT:  Head: Normocephalic and atraumatic.  Cardiovascular: Normal rate and regular rhythm.  Exam reveals no gallop and no friction rub.   No murmur heard. Pulmonary/Chest: Effort normal and breath sounds normal. No respiratory distress. He has no wheezes. He has no rales.  Abdominal: Soft. Bowel sounds are normal. He exhibits no distension. There is no tenderness.  Neurological: He is alert and oriented to person, place, and time.  Skin: Skin is warm. He is not diaphoretic.    Mallampati Score:  MD Evaluation Airway: WNL Heart: WNL Abdomen: WNL Chest/ Lungs: WNL ASA  Classification: 2 Mallampati/Airway Score: One  Labs:  CBC:  Recent Labs  02/14/15 1112 02/15/15 0514 02/16/15 0548 04/18/15 1025  WBC 6.1 5.6 4.9 4.1  HGB 11.7* 11.8* 11.5* 14.1  HCT 33.6* 33.9* 33.6* 43.0  PLT 183 173 179 212    COAGS: No results for input(s): INR, APTT in the last 8760 hours.  BMP:  Recent Labs  02/14/15 2008 02/15/15 0514 02/16/15 0548 04/18/15 1025  NA 123* 127* 133* 137  K 4.1 3.8 3.3* 4.1  CL 90* 96* 100* 105  CO2 26 23 24 28   GLUCOSE 123* 103* 97 111*  BUN 12 7 8 18   CALCIUM 8.9 8.8* 8.5* 9.2  CREATININE 1.01 0.80 0.88 1.04  GFRNONAA >60 >  60 >60 >60  GFRAA >60 >60 >60 >60    Assessment/Plan:  Thoracic 11 compression fracture s/p fall Back pain uncontrolled with brace and medication trial Seen in full consult on 04/06/15, imaging reviewed and deemed a candidate for T11 Vertebroplasty/Kyphoplasty with sedation The patient has been NPO, no blood thinners taken, labs and vitals have been reviewed. Risks and Benefits discussed with the patient including, but not limited to education regarding the natural healing process of compression fractures without intervention, bleeding, infection, cement migration which may cause spinal  cord damage, paralysis, pulmonary embolism or even death. All of the patient's questions were answered, patient is agreeable to proceed. Consent signed and in chart.    SignedHedy Jacob 04/18/2015, 11:03 AM

## 2015-04-18 NOTE — Discharge Instructions (Signed)
Conscious Sedation, Adult, Care After Refer to this sheet in the next few weeks. These instructions provide you with information on caring for yourself after your procedure. Your health care provider may also give you more specific instructions. Your treatment has been planned according to current medical practices, but problems sometimes occur. Call your health care provider if you have any problems or questions after your procedure. WHAT TO EXPECT AFTER THE PROCEDURE  After your procedure:  You may feel sleepy, clumsy, and have poor balance for several hours.  Vomiting may occur if you eat too soon after the procedure. HOME CARE INSTRUCTIONS  Do not participate in any activities where you could become injured for at least 24 hours. Do not:  Drive.  Swim.  Ride a bicycle.  Operate heavy machinery.  Cook.  Use power tools.  Climb ladders.  Work from a high place.  Do not make important decisions or sign legal documents until you are improved.  If you vomit, drink water, juice, or soup when you can drink without vomiting. Make sure you have little or no nausea before eating solid foods.  Only take over-the-counter or prescription medicines for pain, discomfort, or fever as directed by your health care provider.  Make sure you and your family fully understand everything about the medicines given to you, including what side effects may occur.  You should not drink alcohol, take sleeping pills, or take medicines that cause drowsiness for at least 24 hours.  If you smoke, do not smoke without supervision.  If you are feeling better, you may resume normal activities 24 hours after you were sedated.  Keep all appointments with your health care provider. SEEK MEDICAL CARE IF:  Your skin is pale or bluish in color.  You continue to feel nauseous or vomit.  Your pain is getting worse and is not helped by medicine.  You have bleeding or swelling.  You are still sleepy or  feeling clumsy after 24 hours. SEEK IMMEDIATE MEDICAL CARE IF:  You develop a rash.  You have difficulty breathing.  You develop any type of allergic problem.  You have a fever. MAKE SURE YOU:  Understand these instructions.  Will watch your condition.  Will get help right away if you are not doing well or get worse. Document Released: 04/21/2013 Document Reviewed: 04/21/2013 [facility] Patient Information 2015 [place], [place]. This information is not intended to replace advice given to you by your health care provider. Make sure you discuss any questions you have with your health care provider.                                        Percutaneous Vertebroplasty, Care After These instructions give you information on caring for yourself after your procedure. Your doctor may also give you more specific instructions. Call your doctor if you have any problems or questions after your procedure. HOME CARE  Take medicine as told by your doctor.  Keep your wound dry and covered for 24 hours or as told by your doctor.  Put an ice pack on your wound.  Put ice in a plastic bag.  Place a towel between your skin and the bag.  Leave the ice on for 15-20 minutes, 3-4 times a day.  Rest in your bed for 24 hours or as told by your doctor.  Return to normal activities as told by your doctor.  Ask your doctor what stretches and exercises you can do.  Do not bend or lift anything heavy as told by your doctor. GET HELP IF:  Your wound becomes red, puffy (swollen), or tender to the touch.  You are bleeding or leaking fluid from the wound.  You are sick to your stomach (nauseous) or throw up (vomit) for more than 24 hours after the procedure.  Your back pain does not get better.  You have a fever. GET HELP RIGHT AWAY IF:   You have bad back pain that comes on suddenly.  You cannot control when you pee (urinate) or poop (bowel movement).  You lose feeling (numbness) or have  tingling in your legs or feet, or they become weak.  You have sudden weakness in your arms or legs.  You have shooting pain down your legs.  You have chest pain or a hard time breathing.  You feel dizzy or pass out (faint).  Your vision changes or you cannot talk as you normally do. Document Released: 09/25/2009 Document Revised: 07/06/2013 Document Reviewed: 03/09/2013 [facility] Patient Information 2015 [place], [place]. This information is not intended to replace advice given to you by your health care provider. Make sure you discuss any questions you have with your health care provider.

## 2015-04-19 ENCOUNTER — Telehealth: Payer: Self-pay | Admitting: Radiology

## 2015-04-19 NOTE — Progress Notes (Signed)
Patient called today with complaints of worsening back pain after his procedure yesterday. He denies any radiation of pain down his extremities. He denies any fever or chills. He denies any incontinence or loss of sensation in his extremities. He denies any weakness in his extremities, but has difficulty walking secondary to pain. He is s/p Successful T11 Kyphoplasty on 04/18/15. The patient was discharged from the hospital with controlled back pain of 5/10, he took (3) Advil at 4 pm and states his back pain was 8/10 by 6 pm. Per family around 9 pm he took 1/2 a 50 mg Tramadol with c/o 15/10 back pain and 30 minutes later took the other 1/2. This morning when he called around 9:00 am he rated his back pain 9/10 and was going to take another 50 mg Tramadol, I offered to prescribe a stronger pain medication for the next 24-48 hours and the patient declined. I called the patient at 1:00pm today and he slept for 3 hours with recent pain improved and rated at 6/10. I instructed the daughter to continue Advil or Tramadol as needed every 4-6 hours with hydration and I will call the patient tomorrow to check up. She was appreciative and agrees with the above plan. I have discussed the plan today with Dr. Pascal Lux who agrees   Tsosie Billing PA-C Interventional Radiology  04/19/2015 1:44 PM

## 2015-04-20 ENCOUNTER — Telehealth: Payer: Self-pay | Admitting: Radiology

## 2015-04-20 DIAGNOSIS — Z8505 Personal history of malignant neoplasm of liver: Secondary | ICD-10-CM | POA: Diagnosis not present

## 2015-04-20 DIAGNOSIS — Z87440 Personal history of urinary (tract) infections: Secondary | ICD-10-CM | POA: Diagnosis not present

## 2015-04-20 DIAGNOSIS — Z9181 History of falling: Secondary | ICD-10-CM | POA: Diagnosis not present

## 2015-04-20 DIAGNOSIS — E785 Hyperlipidemia, unspecified: Secondary | ICD-10-CM | POA: Diagnosis not present

## 2015-04-20 DIAGNOSIS — S22089D Unspecified fracture of T11-T12 vertebra, subsequent encounter for fracture with routine healing: Secondary | ICD-10-CM | POA: Diagnosis not present

## 2015-04-20 DIAGNOSIS — M150 Primary generalized (osteo)arthritis: Secondary | ICD-10-CM | POA: Diagnosis not present

## 2015-04-20 DIAGNOSIS — Z85528 Personal history of other malignant neoplasm of kidney: Secondary | ICD-10-CM | POA: Diagnosis not present

## 2015-04-20 NOTE — Progress Notes (Signed)
Patient called with complaints of continued back today not controlled with Tramadol post T11 Kyphoplasty. He was given a Rx today for Oxycodone IR 5mg  to take every 4 hours PRN pain, dispensed #20, no refills. Dr. Pascal Lux has talked with the patient's family today and will see the patient next week in clinic for follow-up.   Tsosie Billing PA-C Interventional Radiology  04/20/2015 12:15 PM

## 2015-04-24 ENCOUNTER — Telehealth: Payer: Self-pay | Admitting: Radiology

## 2015-04-24 ENCOUNTER — Other Ambulatory Visit (HOSPITAL_COMMUNITY): Payer: Self-pay | Admitting: Interventional Radiology

## 2015-04-24 DIAGNOSIS — S22000B Wedge compression fracture of unspecified thoracic vertebra, initial encounter for open fracture: Secondary | ICD-10-CM

## 2015-04-24 NOTE — Progress Notes (Signed)
Patient's daughter called on behalf of Corey Davila Dr. Pascal Lux was notified today with ongoing complaints of continued back pain post kyphoplasty. He states Oxycodone was not helping his pain and his previously prescribed Tramadol worked to control the pain best. He denies any fever or chills. He denies any weakness in extremities or loss of bowel or bladder function. He has a scheduled follow-up appointment with Dr. Pascal Lux this Wednesday at [facility]. Tramadol 50 mg Rx called in to take 1/2 to 1 tablet q 6 hrs PRN pain dispense #20, no refills. He was instructed to go to ED if symptoms worsen or not controlled with pain medication and stated would wait for follow up appointment. Dr. Pascal Lux is aware and agrees with the above plan   Tsosie Billing PA-C Interventional Radiology  04/24/2015 10:43 AM

## 2015-04-26 ENCOUNTER — Other Ambulatory Visit: Payer: Self-pay | Admitting: Family Medicine

## 2015-04-26 ENCOUNTER — Other Ambulatory Visit: Payer: Self-pay | Admitting: Radiology

## 2015-04-26 ENCOUNTER — Ambulatory Visit
Admission: RE | Admit: 2015-04-26 | Discharge: 2015-04-26 | Disposition: A | Discharge status: Home / Self Care | Payer: Medicare Other | Source: Ambulatory Visit | Attending: Radiology | Admitting: Radiology

## 2015-04-26 ENCOUNTER — Ambulatory Visit
Admission: RE | Admit: 2015-04-26 | Discharge: 2015-04-26 | Disposition: A | Discharge status: Home / Self Care | Payer: Medicare Other | Source: Ambulatory Visit | Attending: Interventional Radiology | Admitting: Interventional Radiology

## 2015-04-26 DIAGNOSIS — M545 Low back pain, unspecified: Secondary | ICD-10-CM

## 2015-04-26 DIAGNOSIS — M546 Pain in thoracic spine: Secondary | ICD-10-CM

## 2015-04-26 DIAGNOSIS — M549 Dorsalgia, unspecified: Secondary | ICD-10-CM

## 2015-04-26 DIAGNOSIS — S22000B Wedge compression fracture of unspecified thoracic vertebra, initial encounter for open fracture: Secondary | ICD-10-CM

## 2015-04-26 DIAGNOSIS — G8929 Other chronic pain: Secondary | ICD-10-CM

## 2015-04-26 NOTE — Progress Notes (Signed)
Patient ID: Corey Davila, male   DOB: [birth year], 81 y.o.   MRN: [id number]       Chief Complaint: Patient was seen in consultation today for  Chief Complaint  Patient presents with  . Follow-up    1 wk follow up Kyphoplasty   Referring Physician(s): Begovich  History of Present Illness: Corey Davila is a 81 y.o. male with past history significant for renal cell carcinoma and hepatocellular carcinoma who returns to the interventional radiology clinic following tendon successful T11 kyphoplasty performed on 04/18/2015.   Patient recovered uneventfully following the procedure though has experienced severe persistent back pain beginning the night of the procedure. Patient has been called in prescriptions for both oxycodone as well as Tramadol however these have done little to alleviate the patient's persistent back pain.   The patient states his pain is now at 8-10 at rest and worse at the evenings. Patient states that his back pain is lower than the intervened upon T11 vertebral body and centralized in his low back and posterior hips bilaterally.  The patient denies change in bowel or bladder function. No fever or chills.  Past Medical History  Diagnosis Date  . Mixed hyperlipidemia   . Pneumonia ~ 2012  . Sleep apnea     "doesn't wear mask" (02/13/2015)  . Hearing loss of both ears   . Arthritis     "hands, back" (02/13/2015)  . Chronic lower back pain     "since 02/2014" (02/13/2015)  . Liver cancer ([place])     partial liver resection 06/18/2007; partial liver resection 08/29/2010, not renal  . Cancer of kidney ([place]) 06/19/1979    L renal taken out     Past Surgical History  Procedure Laterality Date  . Nephrectomy Left 1980    [place] -cancer  . Open partial hepatectomy [83]  06/18/07    "75%", [facility]  R lobe-cancer  . Open partial hepatectomy [83]  08/29/2010    "20%", [facility]  L lobe  . Colonoscopy    . Carpal tunnel release Left 09/10/2013    Procedure: LEFT CARPAL  TUNNEL RELEASE;  Surgeon: Cammie Sickle., MD;  Location: [facility];  Service: Orthopedics;  Laterality: Left;    Allergies: Bee venom; Hornet venom; Tessalon; Tussicaps; Amoxicillin; Dilantin; Hydrocodone-chlorpheniramine; Mevacor; Niacin and related; Prednisone; and Septra ds  Medications: Prior to Admission medications   Medication Sig Start Date End Date Taking? Authorizing Provider  aspirin 81 MG tablet Take 81 mg by mouth daily.    Historical Provider, MD  Calcium Citrate 200 MG TABS Take 1 tablet by mouth daily. 08/21/10   Historical Provider, MD  Calcium-Vit D3-Phytosterols [phone] MG-UNIT-MG TABS Take 1 tablet by mouth daily. 04/21/07   Historical Provider, MD  CRESTOR 20 MG tablet Take 1 tablet by mouth every other day.    Historical Provider, MD  docusate sodium (COLACE) 100 MG capsule Take 100 mg by mouth daily. 08/21/10   Historical Provider, MD  finasteride (PROSCAR) 5 MG tablet Take 1 tablet by mouth daily.    Historical Provider, MD  Glucosamine-Chondroitin 250-200 MG TABS Take 1 tablet by mouth daily. Regular strength tabs. 04/21/07   Historical Provider, MD  ibuprofen (ADVIL,MOTRIN) 200 MG tablet Take 200 mg by mouth every 6 (six) hours as needed.    Historical Provider, MD  meclizine (ANTIVERT) 12.5 MG tablet Take by mouth 3 (three) times daily as needed for dizziness.  12/01/14   Historical Provider, MD  Multiple  Vitamin (MULTIVITAMIN WITH MINERALS) TABS tablet Take 1 tablet by mouth daily.    Historical Provider, MD  vitamin B-12 (CYANOCOBALAMIN) 250 MCG tablet Take 250 mcg by mouth daily.    Historical Provider, MD     Family History  Problem Relation Age of Onset  . Stroke Father     Social History   Social History  . Marital Status: Married    Spouse Name: N/A  . Number of Children: N/A  . Years of Education: N/A   Occupational History  . 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 Smoking status: Never  Smoker   . Smokeless tobacco: Never Used  . Alcohol Use: No  . Drug Use: No  . Sexual Activity: Not on file   Other Topics Concern  . Not on file   Social History Narrative    ECOG Status: 2 - Symptomatic, <50% confined to bed  Review of Systems: A 12 point ROS discussed and pertinent positives are indicated in the HPI above.  All other systems are negative.  Review of Systems  Constitutional: Positive for activity change and fatigue. Negative for chills.  Respiratory: Negative.   Cardiovascular: Negative.   Gastrointestinal: Negative.   Genitourinary: Negative.   Musculoskeletal: Positive for back pain and gait problem.  Psychiatric/Behavioral: Negative.     Vital Signs: BP 145/76 mmHg  Pulse 62  Temp(Src) 97.5 F (36.4 C) (Oral)  Resp 14  SpO2 94%  Physical Exam  Constitutional: He is oriented to person, place, and time.  Musculoskeletal: He exhibits tenderness.  Diffusely tender with palpation of the lower back bilaterally, approximately 6 inches below the T11 KP operative site.    Operative site appears well healed.  Neurological: He is alert and oriented to person, place, and time.    Imaging: Dg Thoracic Spine W/swimmers  04/26/2015  CLINICAL DATA:  Post T11 kyphoplasty with persistent back pain. EXAM: THORACIC SPINE - 3 VIEWS COMPARISON:  Chest CT- 02/13/2015; 10/08/2014; thoracic spine MRI - 03/24/2015; fluoroscopic guided T11 kyphoplasty -04/18/2015 FINDINGS: Post cement augmentation involving the anterior aspect of the superior endplate of the Corey Davila vertebral body. No definitive cement extravasation. No change to minimal restoration in mild-to-moderate T11 vertebral body compression fracture. Remaining thoracic vertebral body heights appear preserved. Thoracic intervertebral disc spaces appear preserved. Limited visualization adjacent thorax demonstrates atherosclerotic plaque within the thoracic aorta. Limited visualization adjacent thorax is normal. Surgical  clips overlie the expected location of the gastroesophageal junction. Mild scoliotic curvature of the thoracolumbar spine centered about the cranial aspect of the lumbar spine. No anterolisthesis or retrolisthesis. IMPRESSION: No evidence of complication following Corey Davila vertebral body augmentation. Electronically Signed   By: Sandi Mariscal M.D.   On: 04/26/2015 12:35   Ir Kypho Vertebral Thoracic Augmentation  04/18/2015  CLINICAL DATA:  History of liver cancer and renal cell carcinoma, now with symptomatic T11 compression fracture. Patient presents today for fluoroscopic guided biopsy and vertebral body cement augmentation. EXAM: FLUOROSCOPIC GUIDED KYPHOPLASTY OF THE T11 VERTEBRAL BODY COMPARISON:  Thoracic spine MRI - 03/24/2015; chest CT - 02/13/2015 MEDICATIONS: As antibiotic prophylaxis, clindamycin 600 mg IV was ordered pre-procedure and administered intravenously within 1 hour of incision. ANESTHESIA/SEDATION: Versed 5 mg IV; Fentanyl 75 mcg IV Sedation time: 40 min FLUOROSCOPY TIME:  8 min, 30 seconds (086 mGy) COMPLICATIONS: None immediate TECHNIQUE: The procedure, risks (including but not limited to bleeding, infection, organ damage), benefits, and alternatives were explained to the patient.  Questions regarding the procedure were encouraged and answered. The patient understands and consents to the procedure. The patient was placed prone on the fluoroscopic table. The skin overlying the upper thoracic region was then prepped and draped in the usual sterile fashion. Maximal barrier sterile technique was utilized including caps, mask, sterile gowns, sterile gloves, sterile drape, hand hygiene and skin antiseptic. Intravenous Fentanyl and Versed were administered as conscious sedation during continuous cardiorespiratory monitoring by the radiology RN. The left pedicle at T11 was then infiltrated with 1% lidocaine followed by the advancement of a Kyphon trocar needle through the left pedicle into the posterior  one-third of the vertebral body. The trocar was removed and a bone biopsy was obtained at this location. Subsequently, the osteo drill was advanced to the anterior third of the vertebral body. The osteo drill was retracted. Through the working cannula, a Kyphon inflatable bone tamp 20 x 3 was advanced and positioned with the distal marker approximately 5 mm from the anterior aspect of the cortex. Appropriate positioning was confirmed on the AP projection. At this time, the balloon was expanded using contrast via a Kyphon inflation syringe device via micro tubing. Inflations were continued until there was near apposition with the superior end plate. At this time, methylmethacrylate mixture was reconstituted in the Kyphon bone mixing device system. This was then loaded into the delivery mechanism and attached to Kyphon bone fillers. The balloon was deflated and removed followed by the instillation of methylmethacrylate mixture with excellent filling in the AP and lateral projections. No extravasation was noted in the disk spaces or posteriorly into the spinal canal. No epidural venous contamination was seen. The working cannulae and the bone filler were then retrieved and removed. Hemostasis was achieved with manual compression. The patient tolerated the procedure well without immediate postprocedural complication. IMPRESSION: 1. Technically successful T11 vertebral body augmentation using balloon kyphoplasty via a unilateral (left-sided) transpedicular approach. 2. Technically successful T11 vertebral body biopsy. 3. Per CMS PQRS reporting requirements (PQRS Measure 24): Given the patient's age of greater than 80 and the fracture site (hip, distal radius, or spine), the patient should be tested for osteoporosis using DXA, and the appropriate treatment considered based on the DXA results. Electronically Signed   By: Sandi Mariscal M.D.   On: 04/18/2015 16:27    Labs:  CBC:  Recent Labs  02/14/15 1112  02/15/15 0514 02/16/15 0548 04/18/15 1025  WBC 6.1 5.6 4.9 4.1  HGB 11.7* 11.8* 11.5* 14.1  HCT 33.6* 33.9* 33.6* 43.0  PLT 183 173 179 212    COAGS:  Recent Labs  04/18/15 1025  INR 1.10  APTT 31    BMP:  Recent Labs  02/14/15 2008 02/15/15 0514 02/16/15 0548 04/18/15 1025  NA 123* 127* 133* 137  K 4.1 3.8 3.3* 4.1  CL 90* 96* 100* 105  CO2 26 23 24 28   GLUCOSE 123* 103* 97 111*  BUN 12 7 8 18   CALCIUM 8.9 8.8* 8.5* 9.2  CREATININE 1.01 0.80 0.88 1.04  GFRNONAA >60 >60 >60 >60  GFRAA >60 >60 >60 >60    LIVER FUNCTION TESTS: No results for input(s): BILITOT, AST, ALT, ALKPHOS, PROT, ALBUMIN in the last 8760 hours.  TUMOR MARKERS: No results for input(s): AFPTM, CEA, CA199, CHROMGRNA in the last 8760 hours.  Assessment and Plan:   Corey Davila is a 81 y.o. male with past history significant for renal cell carcinoma and hepatocellular carcinoma who returns to the interventional radiology clinic following tendon  successful T11 kyphoplasty performed on 04/18/2015.   Patient recovered uneventfully following the procedure though has experienced severe persistent back pain beginning the night of the procedure. Patient has been called in prescriptions for both oxycodone as well as Tramadol however these have done little to alleviate the patient's persistent back pain.  The patient states his pain is now at 8-10 at rest and worse at the evenings. Patient states that his back pain is lower than the intervened upon T11 vertebral body and centralized in his low back and posterior hips bilaterally.  The patient denies change in bowel or bladder function. No fever or chills.  Selected images from chest CT performed 10/08/2014, 02/13/2015; thoracic spine MRI performed 03/23/05/2016, images from fluoroscopic guided T11 kyphoplasty performed 04/18/2015 as well as thoracic spine radiographs obtained today at clinic reviewed in detail with the patient and the patient's family. Review  of thoracic spine regressed performed today demonstrates unchanged appearance of tendon successful T11 kyphoplasty without evidence of cement extravasation.  On physical examination, it is evident that the patient's low back pain is remote from the T11 operative site, approximally 6 inches below the well-healed T11 kyphoplasty puncture site. As such, I feel the patient's back pain is likely attributable to acute exacerbation of his known multilevel lumbar spine degenerative change seen on prior lumbar spine MRI performed 01/28/2015.  Selected images from this lumbar spine MRI were also reviewed with the patient and the patient's family.   As such, I will arrange for the patient to undergo a fluoroscopic guided epidural spinal injection later this week (at the time of this dictation, I have contacted Dr. Moreen Fowler regarding the acquisition of this order).  In the meantime, I have advised the patient continued to take his medication as prescribed and to forego physical therapy sessions until his pain is more manageable.  The patient and the patient's family are in agreement with this proposed plan of care.  SignedSandi Mariscal 04/26/2015, 1:39 PM   I spent a total of 25 Minutes in face to face in clinical consultation, greater than 50% of which was counseling/coordinating care for persistent back pain despite technically successful T11 kyphoplasty.

## 2015-04-27 ENCOUNTER — Other Ambulatory Visit: Payer: Medicare Other

## 2015-04-27 DIAGNOSIS — M545 Low back pain: Secondary | ICD-10-CM | POA: Diagnosis not present

## 2015-04-27 DIAGNOSIS — S2242XD Multiple fractures of ribs, left side, subsequent encounter for fracture with routine healing: Secondary | ICD-10-CM | POA: Diagnosis not present

## 2015-04-27 DIAGNOSIS — Z8505 Personal history of malignant neoplasm of liver: Secondary | ICD-10-CM | POA: Diagnosis not present

## 2015-04-27 DIAGNOSIS — E871 Hypo-osmolality and hyponatremia: Secondary | ICD-10-CM | POA: Diagnosis not present

## 2015-04-27 DIAGNOSIS — D649 Anemia, unspecified: Secondary | ICD-10-CM | POA: Diagnosis not present

## 2015-04-27 DIAGNOSIS — R42 Dizziness and giddiness: Secondary | ICD-10-CM | POA: Diagnosis not present

## 2015-04-27 DIAGNOSIS — R7303 Prediabetes: Secondary | ICD-10-CM | POA: Diagnosis not present

## 2015-04-27 DIAGNOSIS — R7309 Other abnormal glucose: Secondary | ICD-10-CM | POA: Diagnosis not present

## 2015-04-27 DIAGNOSIS — Z23 Encounter for immunization: Secondary | ICD-10-CM | POA: Diagnosis not present

## 2015-04-27 DIAGNOSIS — Z1389 Encounter for screening for other disorder: Secondary | ICD-10-CM | POA: Diagnosis not present

## 2015-04-27 DIAGNOSIS — S22080D Wedge compression fracture of T11-T12 vertebra, subsequent encounter for fracture with routine healing: Secondary | ICD-10-CM | POA: Diagnosis not present

## 2015-04-27 DIAGNOSIS — R350 Frequency of micturition: Secondary | ICD-10-CM | POA: Diagnosis not present

## 2015-04-27 DIAGNOSIS — E782 Mixed hyperlipidemia: Secondary | ICD-10-CM | POA: Diagnosis not present

## 2015-04-28 ENCOUNTER — Ambulatory Visit
Admission: RE | Admit: 2015-04-28 | Discharge: 2015-04-28 | Disposition: A | Discharge status: Home / Self Care | Payer: Medicare Other | Source: Ambulatory Visit | Attending: Family Medicine | Admitting: Family Medicine

## 2015-04-28 DIAGNOSIS — G8929 Other chronic pain: Secondary | ICD-10-CM

## 2015-04-28 DIAGNOSIS — M545 Low back pain, unspecified: Secondary | ICD-10-CM

## 2015-04-28 MED ORDER — METHYLPREDNISOLONE ACETATE 40 MG/ML INJ SUSP (RADIOLOG
120.0000 mg | Freq: Once | INTRAMUSCULAR | Status: AC
  Administered 2015-04-28: 120 mg via EPIDURAL

## 2015-04-28 MED ORDER — IOHEXOL 180 MG/ML  SOLN
1.0000 mL | Freq: Once | INTRAMUSCULAR | Status: DC | PRN
  Administered 2015-04-28: 1 mL via EPIDURAL

## 2015-04-28 NOTE — Discharge Instructions (Signed)

## 2015-05-02 DIAGNOSIS — Z9181 History of falling: Secondary | ICD-10-CM | POA: Diagnosis not present

## 2015-05-02 DIAGNOSIS — M150 Primary generalized (osteo)arthritis: Secondary | ICD-10-CM | POA: Diagnosis not present

## 2015-05-02 DIAGNOSIS — S22089D Unspecified fracture of T11-T12 vertebra, subsequent encounter for fracture with routine healing: Secondary | ICD-10-CM | POA: Diagnosis not present

## 2015-05-02 DIAGNOSIS — E785 Hyperlipidemia, unspecified: Secondary | ICD-10-CM | POA: Diagnosis not present

## 2015-05-02 DIAGNOSIS — Z85528 Personal history of other malignant neoplasm of kidney: Secondary | ICD-10-CM | POA: Diagnosis not present

## 2015-05-02 DIAGNOSIS — Z87440 Personal history of urinary (tract) infections: Secondary | ICD-10-CM | POA: Diagnosis not present

## 2015-05-04 DIAGNOSIS — S22089D Unspecified fracture of T11-T12 vertebra, subsequent encounter for fracture with routine healing: Secondary | ICD-10-CM | POA: Diagnosis not present

## 2015-05-04 DIAGNOSIS — E785 Hyperlipidemia, unspecified: Secondary | ICD-10-CM | POA: Diagnosis not present

## 2015-05-04 DIAGNOSIS — Z85528 Personal history of other malignant neoplasm of kidney: Secondary | ICD-10-CM | POA: Diagnosis not present

## 2015-05-04 DIAGNOSIS — Z9181 History of falling: Secondary | ICD-10-CM | POA: Diagnosis not present

## 2015-05-04 DIAGNOSIS — M150 Primary generalized (osteo)arthritis: Secondary | ICD-10-CM | POA: Diagnosis not present

## 2015-05-04 DIAGNOSIS — Z87440 Personal history of urinary (tract) infections: Secondary | ICD-10-CM | POA: Diagnosis not present

## 2015-05-05 ENCOUNTER — Other Ambulatory Visit: Payer: Medicare Other

## 2015-05-09 DIAGNOSIS — Z87440 Personal history of urinary (tract) infections: Secondary | ICD-10-CM | POA: Diagnosis not present

## 2015-05-09 DIAGNOSIS — Z85528 Personal history of other malignant neoplasm of kidney: Secondary | ICD-10-CM | POA: Diagnosis not present

## 2015-05-09 DIAGNOSIS — S22089D Unspecified fracture of T11-T12 vertebra, subsequent encounter for fracture with routine healing: Secondary | ICD-10-CM | POA: Diagnosis not present

## 2015-05-09 DIAGNOSIS — E785 Hyperlipidemia, unspecified: Secondary | ICD-10-CM | POA: Diagnosis not present

## 2015-05-09 DIAGNOSIS — M150 Primary generalized (osteo)arthritis: Secondary | ICD-10-CM | POA: Diagnosis not present

## 2015-05-09 DIAGNOSIS — Z9181 History of falling: Secondary | ICD-10-CM | POA: Diagnosis not present

## 2015-05-12 DIAGNOSIS — S22089D Unspecified fracture of T11-T12 vertebra, subsequent encounter for fracture with routine healing: Secondary | ICD-10-CM | POA: Diagnosis not present

## 2015-05-12 DIAGNOSIS — M150 Primary generalized (osteo)arthritis: Secondary | ICD-10-CM | POA: Diagnosis not present

## 2015-05-12 DIAGNOSIS — E785 Hyperlipidemia, unspecified: Secondary | ICD-10-CM | POA: Diagnosis not present

## 2015-05-12 DIAGNOSIS — Z9181 History of falling: Secondary | ICD-10-CM | POA: Diagnosis not present

## 2015-05-12 DIAGNOSIS — Z87440 Personal history of urinary (tract) infections: Secondary | ICD-10-CM | POA: Diagnosis not present

## 2015-05-12 DIAGNOSIS — Z85528 Personal history of other malignant neoplasm of kidney: Secondary | ICD-10-CM | POA: Diagnosis not present

## 2015-05-16 DIAGNOSIS — Z87440 Personal history of urinary (tract) infections: Secondary | ICD-10-CM | POA: Diagnosis not present

## 2015-05-16 DIAGNOSIS — E785 Hyperlipidemia, unspecified: Secondary | ICD-10-CM | POA: Diagnosis not present

## 2015-05-16 DIAGNOSIS — Z85528 Personal history of other malignant neoplasm of kidney: Secondary | ICD-10-CM | POA: Diagnosis not present

## 2015-05-16 DIAGNOSIS — Z9181 History of falling: Secondary | ICD-10-CM | POA: Diagnosis not present

## 2015-05-16 DIAGNOSIS — S22089D Unspecified fracture of T11-T12 vertebra, subsequent encounter for fracture with routine healing: Secondary | ICD-10-CM | POA: Diagnosis not present

## 2015-05-16 DIAGNOSIS — M150 Primary generalized (osteo)arthritis: Secondary | ICD-10-CM | POA: Diagnosis not present

## 2015-05-18 ENCOUNTER — Telehealth: Payer: Self-pay | Admitting: Interventional Radiology

## 2015-05-18 DIAGNOSIS — Z87440 Personal history of urinary (tract) infections: Secondary | ICD-10-CM | POA: Diagnosis not present

## 2015-05-18 DIAGNOSIS — Z85528 Personal history of other malignant neoplasm of kidney: Secondary | ICD-10-CM | POA: Diagnosis not present

## 2015-05-18 DIAGNOSIS — E785 Hyperlipidemia, unspecified: Secondary | ICD-10-CM | POA: Diagnosis not present

## 2015-05-18 DIAGNOSIS — Z9181 History of falling: Secondary | ICD-10-CM | POA: Diagnosis not present

## 2015-05-18 DIAGNOSIS — M150 Primary generalized (osteo)arthritis: Secondary | ICD-10-CM | POA: Diagnosis not present

## 2015-05-18 DIAGNOSIS — S22089D Unspecified fracture of T11-T12 vertebra, subsequent encounter for fracture with routine healing: Secondary | ICD-10-CM | POA: Diagnosis not present

## 2015-05-19 NOTE — Progress Notes (Signed)
Called pt on 11/3.  He reported that he feels much improved following the ESI performed by my partner, Dr. Vernard Gambles.  He feels fully recovered from both procedures and back to his pre injury activity levels.  Pt instructed to call the IR clinic with any future questions or concerns.

## 2015-05-23 DIAGNOSIS — M150 Primary generalized (osteo)arthritis: Secondary | ICD-10-CM | POA: Diagnosis not present

## 2015-05-23 DIAGNOSIS — S22089D Unspecified fracture of T11-T12 vertebra, subsequent encounter for fracture with routine healing: Secondary | ICD-10-CM | POA: Diagnosis not present

## 2015-05-23 DIAGNOSIS — E785 Hyperlipidemia, unspecified: Secondary | ICD-10-CM | POA: Diagnosis not present

## 2015-05-23 DIAGNOSIS — Z85528 Personal history of other malignant neoplasm of kidney: Secondary | ICD-10-CM | POA: Diagnosis not present

## 2015-05-23 DIAGNOSIS — Z9181 History of falling: Secondary | ICD-10-CM | POA: Diagnosis not present

## 2015-05-23 DIAGNOSIS — Z87440 Personal history of urinary (tract) infections: Secondary | ICD-10-CM | POA: Diagnosis not present

## 2015-05-27 DIAGNOSIS — Z9181 History of falling: Secondary | ICD-10-CM | POA: Diagnosis not present

## 2015-05-27 DIAGNOSIS — E785 Hyperlipidemia, unspecified: Secondary | ICD-10-CM | POA: Diagnosis not present

## 2015-05-27 DIAGNOSIS — S22089D Unspecified fracture of T11-T12 vertebra, subsequent encounter for fracture with routine healing: Secondary | ICD-10-CM | POA: Diagnosis not present

## 2015-05-27 DIAGNOSIS — Z87440 Personal history of urinary (tract) infections: Secondary | ICD-10-CM | POA: Diagnosis not present

## 2015-05-27 DIAGNOSIS — M150 Primary generalized (osteo)arthritis: Secondary | ICD-10-CM | POA: Diagnosis not present

## 2015-05-27 DIAGNOSIS — Z85528 Personal history of other malignant neoplasm of kidney: Secondary | ICD-10-CM | POA: Diagnosis not present

## 2015-06-07 DIAGNOSIS — H532 Diplopia: Secondary | ICD-10-CM | POA: Diagnosis not present

## 2015-07-28 DIAGNOSIS — E782 Mixed hyperlipidemia: Secondary | ICD-10-CM | POA: Diagnosis not present

## 2015-07-28 DIAGNOSIS — N3281 Overactive bladder: Secondary | ICD-10-CM | POA: Diagnosis not present

## 2015-07-28 DIAGNOSIS — R7303 Prediabetes: Secondary | ICD-10-CM | POA: Diagnosis not present

## 2015-07-28 DIAGNOSIS — M545 Low back pain: Secondary | ICD-10-CM | POA: Diagnosis not present

## 2015-07-28 DIAGNOSIS — S22080D Wedge compression fracture of T11-T12 vertebra, subsequent encounter for fracture with routine healing: Secondary | ICD-10-CM | POA: Diagnosis not present

## 2015-07-28 DIAGNOSIS — S2242XD Multiple fractures of ribs, left side, subsequent encounter for fracture with routine healing: Secondary | ICD-10-CM | POA: Diagnosis not present

## 2015-07-28 DIAGNOSIS — Z8505 Personal history of malignant neoplasm of liver: Secondary | ICD-10-CM | POA: Diagnosis not present

## 2015-07-28 DIAGNOSIS — E871 Hypo-osmolality and hyponatremia: Secondary | ICD-10-CM | POA: Diagnosis not present

## 2015-07-28 DIAGNOSIS — N400 Enlarged prostate without lower urinary tract symptoms: Secondary | ICD-10-CM | POA: Diagnosis not present

## 2015-07-28 DIAGNOSIS — R42 Dizziness and giddiness: Secondary | ICD-10-CM | POA: Diagnosis not present

## 2015-07-28 DIAGNOSIS — R739 Hyperglycemia, unspecified: Secondary | ICD-10-CM | POA: Diagnosis not present

## 2015-08-07 ENCOUNTER — Other Ambulatory Visit: Payer: Self-pay | Admitting: Family Medicine

## 2015-08-07 DIAGNOSIS — M545 Low back pain, unspecified: Secondary | ICD-10-CM

## 2015-08-07 DIAGNOSIS — G8929 Other chronic pain: Secondary | ICD-10-CM

## 2015-08-09 ENCOUNTER — Ambulatory Visit
Admission: RE | Admit: 2015-08-09 | Discharge: 2015-08-09 | Disposition: A | Discharge status: Home / Self Care | Payer: Medicare Other | Source: Ambulatory Visit | Attending: Family Medicine | Admitting: Family Medicine

## 2015-08-09 DIAGNOSIS — G8929 Other chronic pain: Secondary | ICD-10-CM

## 2015-08-09 DIAGNOSIS — M545 Low back pain: Secondary | ICD-10-CM | POA: Diagnosis not present

## 2015-08-09 MED ORDER — METHYLPREDNISOLONE ACETATE 40 MG/ML INJ SUSP (RADIOLOG
120.0000 mg | Freq: Once | INTRAMUSCULAR | Status: AC
  Administered 2015-08-09: 120 mg via EPIDURAL

## 2015-08-09 MED ORDER — IOHEXOL 180 MG/ML  SOLN
1.0000 mL | Freq: Once | INTRAMUSCULAR | Status: AC | PRN
  Administered 2015-08-09: 1 mL via EPIDURAL

## 2015-08-09 NOTE — Discharge Instructions (Signed)

## 2015-08-16 DIAGNOSIS — R269 Unspecified abnormalities of gait and mobility: Secondary | ICD-10-CM | POA: Diagnosis not present

## 2015-08-16 DIAGNOSIS — R279 Unspecified lack of coordination: Secondary | ICD-10-CM | POA: Diagnosis not present

## 2015-08-21 DIAGNOSIS — H5213 Myopia, bilateral: Secondary | ICD-10-CM | POA: Diagnosis not present

## 2015-08-21 DIAGNOSIS — H2513 Age-related nuclear cataract, bilateral: Secondary | ICD-10-CM | POA: Diagnosis not present

## 2015-09-14 DIAGNOSIS — H2512 Age-related nuclear cataract, left eye: Secondary | ICD-10-CM | POA: Diagnosis not present

## 2015-09-14 DIAGNOSIS — H25812 Combined forms of age-related cataract, left eye: Secondary | ICD-10-CM | POA: Diagnosis not present

## 2015-09-22 ENCOUNTER — Other Ambulatory Visit: Payer: Self-pay | Admitting: Family Medicine

## 2015-09-22 DIAGNOSIS — M545 Low back pain: Principal | ICD-10-CM

## 2015-09-22 DIAGNOSIS — G8929 Other chronic pain: Secondary | ICD-10-CM

## 2015-09-27 ENCOUNTER — Ambulatory Visit
Admission: RE | Admit: 2015-09-27 | Discharge: 2015-09-27 | Disposition: A | Discharge status: Home / Self Care | Payer: Medicare Other | Source: Ambulatory Visit | Attending: Family Medicine | Admitting: Family Medicine

## 2015-09-27 DIAGNOSIS — M545 Low back pain: Principal | ICD-10-CM

## 2015-09-27 DIAGNOSIS — G8929 Other chronic pain: Secondary | ICD-10-CM

## 2015-09-27 DIAGNOSIS — M47817 Spondylosis without myelopathy or radiculopathy, lumbosacral region: Secondary | ICD-10-CM | POA: Diagnosis not present

## 2015-09-27 MED ORDER — METHYLPREDNISOLONE ACETATE 40 MG/ML INJ SUSP (RADIOLOG
120.0000 mg | Freq: Once | INTRAMUSCULAR | Status: AC
  Administered 2015-09-27: 120 mg via EPIDURAL

## 2015-09-27 MED ORDER — IOHEXOL 180 MG/ML  SOLN
1.0000 mL | Freq: Once | INTRAMUSCULAR | Status: AC | PRN
  Administered 2015-09-27: 1 mL via EPIDURAL

## 2015-09-27 NOTE — Discharge Instructions (Signed)

## 2015-10-19 DIAGNOSIS — H2511 Age-related nuclear cataract, right eye: Secondary | ICD-10-CM | POA: Diagnosis not present

## 2015-10-19 DIAGNOSIS — H25811 Combined forms of age-related cataract, right eye: Secondary | ICD-10-CM | POA: Diagnosis not present

## 2015-11-21 DIAGNOSIS — C220 Liver cell carcinoma: Secondary | ICD-10-CM | POA: Diagnosis not present

## 2015-11-21 DIAGNOSIS — R918 Other nonspecific abnormal finding of lung field: Secondary | ICD-10-CM | POA: Diagnosis not present

## 2015-11-21 DIAGNOSIS — C228 Malignant neoplasm of liver, primary, unspecified as to type: Secondary | ICD-10-CM | POA: Diagnosis not present

## 2015-11-21 DIAGNOSIS — Z9089 Acquired absence of other organs: Secondary | ICD-10-CM | POA: Diagnosis not present

## 2015-11-30 ENCOUNTER — Other Ambulatory Visit: Payer: Self-pay | Admitting: Family Medicine

## 2015-11-30 DIAGNOSIS — M545 Low back pain: Secondary | ICD-10-CM

## 2015-12-06 ENCOUNTER — Ambulatory Visit
Admission: RE | Admit: 2015-12-06 | Discharge: 2015-12-06 | Disposition: A | Discharge status: Home / Self Care | Payer: Medicare Other | Source: Ambulatory Visit | Attending: Family Medicine | Admitting: Family Medicine

## 2015-12-06 DIAGNOSIS — M545 Low back pain: Secondary | ICD-10-CM

## 2015-12-06 MED ORDER — METHYLPREDNISOLONE ACETATE 40 MG/ML INJ SUSP (RADIOLOG
120.0000 mg | Freq: Once | INTRAMUSCULAR | Status: AC
  Administered 2015-12-06: 120 mg via EPIDURAL

## 2015-12-06 MED ORDER — IOPAMIDOL (ISOVUE-M 200) INJECTION 41%
1.0000 mL | Freq: Once | INTRAMUSCULAR | Status: AC
  Administered 2015-12-06: 1 mL via EPIDURAL

## 2015-12-06 NOTE — Discharge Instructions (Signed)

## 2015-12-26 DIAGNOSIS — M47896 Other spondylosis, lumbar region: Secondary | ICD-10-CM | POA: Diagnosis not present

## 2016-01-05 DIAGNOSIS — M5416 Radiculopathy, lumbar region: Secondary | ICD-10-CM | POA: Diagnosis not present

## 2016-01-05 DIAGNOSIS — M4806 Spinal stenosis, lumbar region: Secondary | ICD-10-CM | POA: Diagnosis not present

## 2016-01-05 DIAGNOSIS — Z905 Acquired absence of kidney: Secondary | ICD-10-CM | POA: Diagnosis not present

## 2016-01-05 DIAGNOSIS — Z9889 Other specified postprocedural states: Secondary | ICD-10-CM | POA: Diagnosis not present

## 2016-01-05 DIAGNOSIS — M47816 Spondylosis without myelopathy or radiculopathy, lumbar region: Secondary | ICD-10-CM | POA: Diagnosis not present

## 2016-01-05 DIAGNOSIS — M47815 Spondylosis without myelopathy or radiculopathy, thoracolumbar region: Secondary | ICD-10-CM | POA: Diagnosis not present

## 2016-01-05 DIAGNOSIS — D1809 Hemangioma of other sites: Secondary | ICD-10-CM | POA: Diagnosis not present

## 2016-01-05 DIAGNOSIS — M4316 Spondylolisthesis, lumbar region: Secondary | ICD-10-CM | POA: Diagnosis not present

## 2016-01-21 ENCOUNTER — Encounter (HOSPITAL_BASED_OUTPATIENT_CLINIC_OR_DEPARTMENT_OTHER): Payer: Self-pay | Admitting: Emergency Medicine

## 2016-01-21 ENCOUNTER — Emergency Department (HOSPITAL_BASED_OUTPATIENT_CLINIC_OR_DEPARTMENT_OTHER): Payer: Medicare Other

## 2016-01-21 ENCOUNTER — Emergency Department (HOSPITAL_BASED_OUTPATIENT_CLINIC_OR_DEPARTMENT_OTHER)
Admission: EM | Admit: 2016-01-21 | Discharge: 2016-01-21 | Disposition: A | Discharge status: Home / Self Care | Payer: Medicare Other | Attending: Emergency Medicine | Admitting: Emergency Medicine

## 2016-01-21 DIAGNOSIS — S0990XA Unspecified injury of head, initial encounter: Secondary | ICD-10-CM | POA: Diagnosis not present

## 2016-01-21 DIAGNOSIS — Z8505 Personal history of malignant neoplasm of liver: Secondary | ICD-10-CM | POA: Diagnosis not present

## 2016-01-21 DIAGNOSIS — W01198A Fall on same level from slipping, tripping and stumbling with subsequent striking against other object, initial encounter: Secondary | ICD-10-CM | POA: Diagnosis not present

## 2016-01-21 DIAGNOSIS — M1990 Unspecified osteoarthritis, unspecified site: Secondary | ICD-10-CM | POA: Insufficient documentation

## 2016-01-21 DIAGNOSIS — S199XXA Unspecified injury of neck, initial encounter: Secondary | ICD-10-CM | POA: Diagnosis not present

## 2016-01-21 DIAGNOSIS — Y929 Unspecified place or not applicable: Secondary | ICD-10-CM | POA: Insufficient documentation

## 2016-01-21 DIAGNOSIS — Y9301 Activity, walking, marching and hiking: Secondary | ICD-10-CM | POA: Insufficient documentation

## 2016-01-21 DIAGNOSIS — Y999 Unspecified external cause status: Secondary | ICD-10-CM | POA: Diagnosis not present

## 2016-01-21 DIAGNOSIS — W19XXXA Unspecified fall, initial encounter: Secondary | ICD-10-CM

## 2016-01-21 DIAGNOSIS — M542 Cervicalgia: Secondary | ICD-10-CM | POA: Diagnosis not present

## 2016-01-21 DIAGNOSIS — Z85528 Personal history of other malignant neoplasm of kidney: Secondary | ICD-10-CM | POA: Diagnosis not present

## 2016-01-21 LAB — CBC WITH DIFFERENTIAL/PLATELET
BASOS ABS: 0 10*3/uL (ref 0.0–0.1)
Band Neutrophils: 1 %
Basophils Relative: 0 %
EOS ABS: 0.1 10*3/uL (ref 0.0–0.7)
Eosinophils Relative: 3 %
HEMATOCRIT: 39.5 % (ref 39.0–52.0)
HEMOGLOBIN: 13.3 g/dL (ref 13.0–17.0)
LYMPHS PCT: 34 %
Lymphs Abs: 1.3 10*3/uL (ref 0.7–4.0)
MCH: 33.8 pg (ref 26.0–34.0)
MCHC: 33.7 g/dL (ref 30.0–36.0)
MCV: 100.3 fL — AB (ref 78.0–100.0)
MONOS PCT: 15 %
Monocytes Absolute: 0.6 10*3/uL (ref 0.1–1.0)
NEUTROS PCT: 47 %
Neutro Abs: 1.7 10*3/uL (ref 1.7–7.7)
Platelets: 163 10*3/uL (ref 150–400)
RBC: 3.94 MIL/uL — AB (ref 4.22–5.81)
RDW: 13.2 % (ref 11.5–15.5)
WBC: 3.7 10*3/uL — AB (ref 4.0–10.5)

## 2016-01-21 LAB — BASIC METABOLIC PANEL
ANION GAP: 6 (ref 5–15)
BUN: 17 mg/dL (ref 6–20)
CALCIUM: 9.1 mg/dL (ref 8.9–10.3)
CHLORIDE: 100 mmol/L — AB (ref 101–111)
CO2: 27 mmol/L (ref 22–32)
Creatinine, Ser: 1 mg/dL (ref 0.61–1.24)
GFR calc non Af Amer: >60 mL/min (ref >60)
Glucose, Bld: 115 mg/dL — ABNORMAL HIGH (ref 65–99)
POTASSIUM: 3.8 mmol/L (ref 3.5–5.1)
Sodium: 133 mmol/L — ABNORMAL LOW (ref 135–145)

## 2016-01-21 NOTE — ED Provider Notes (Signed)
Medical screening examination/treatment/procedure(s) were conducted as a shared visit with non-physician practitioner(s) and myself.  I personally evaluated the patient during the encounter.  82 year old male here after likely mechanical fall. Was wearing socks on a newly waxed floor without his walker and fell hitting the back with his neck. Exam still with some pain in his neck better with ice. Secondary to be an unwitnessed fall and him not being 100% clear on how happened we'll check some basic labs and EKG. Get a head CT and neck CT to ensure no abnormalities otherwise stable for discharge home.   EKG Interpretation   Date/Time:  Sunday January 21 2016 20:10:15 EDT Ventricular Rate:  63 PR Interval:    QRS Duration: 104 QT Interval:  397 QTC Calculation: 407 R Axis:   72 Text Interpretation:  Normal sinus rhythm st changes in anterior leads  smilar to previous Confirmed by [facility] MD, Bertin Inabinet [phone]) on 01/21/2016  11:24:21 PM        Merrily Pew, MD 01/21/16 2326

## 2016-01-21 NOTE — ED Notes (Signed)
Patient placed on cardiac monitor. Pt placed on auto vitals Q30.  

## 2016-01-21 NOTE — ED Notes (Signed)
Pt not in room, pt in CT.  

## 2016-01-21 NOTE — ED Notes (Signed)
Back from CT, pt/family concerned about R shoulder, EDPA notified and in to re-eval pt, no new orders. Pt alert, NAD, calm, interactive, resps e/u, speaking in clear complete sentences, skin W&D, VSS, no dyspnea noted. Pending imaging results.

## 2016-01-21 NOTE — ED Notes (Signed)
Pt in c/o falling and striking head PTA. Pt denies anticoagulants but does take 81 ASA a day. Pt A+O and in NAD.

## 2016-01-21 NOTE — ED Provider Notes (Signed)
CSN: [id number]     Arrival date & time 01/21/16  1919 History  By signing my name below, I, Randa Evens, attest that this documentation has been prepared under the direction and in the presence of [employer], PA-C. Electronically Signed: Randa Evens, ED Scribe. 01/21/2016. 7:44 PM.    Chief Complaint  Patient presents with  . Fall   Patient is a 82 y.o. male presenting with fall. The history is provided by the patient. No language interpreter was used.  Fall   HPI Comments: Corey Davila is a 82 y.o. male who presents to the Emergency Department complaining of fall onset today 1 hour PTA. Pt is complaining of posterior head pain, right sided neck pain, right sided shoulder pain and bilateral wrist pain. Pt states that he fell on his right side hitting his head and posterior right shoulder on the corner of the wall as he fell. Daughter states that he may have slipped on newly polished floor. Pt states that he is unsure how he fell. Pt has been applying ice PTA. Denies CP, SOB, dizziness, lightheadedness, LOC, vision changes n/v, numbness or weakness. Denies anticoagulant use other than 81mg  ASA.     Past Medical History  Diagnosis Date  . Mixed hyperlipidemia   . Pneumonia ~ 2012  . Sleep apnea     "doesn't wear mask" (02/13/2015)  . Hearing loss of both ears   . Arthritis     "hands, back" (02/13/2015)  . Chronic lower back pain     "since 02/2014" (02/13/2015)  . Liver cancer ([place])     partial liver resection 06/18/2007; partial liver resection 08/29/2010, not renal  . Cancer of kidney ([place]) 06/19/1979    L renal taken out    Past Surgical History  Procedure Laterality Date  . Nephrectomy Left 1980    [place] -cancer  . Open partial hepatectomy [83]  06/18/07    "75%", [facility]  R lobe-cancer  . Open partial hepatectomy [83]  08/29/2010    "20%", [facility]  L lobe  . Colonoscopy    . Carpal tunnel release Left 09/10/2013    Procedure: LEFT CARPAL TUNNEL RELEASE;   Surgeon: Cammie Sickle., MD;  Location: [facility];  Service: Orthopedics;  Laterality: Left;   Family History  Problem Relation Age of Onset  . Stroke Father    Social History  Substance Use Topics  . Smoking status: Never Smoker   . Smokeless tobacco: Never Used  . Alcohol Use: No    Review of Systems A complete 10 system review of systems was obtained and all systems are negative except as noted in the HPI and PMH.     Allergies  Bee venom; Hornet venom; Tessalon; Tussicaps; Amoxicillin; Dilantin; Hydrocodone-chlorpheniramine; Mevacor; Prednisone; Septra ds; and Niacin and related  Home Medications   Prior to Admission medications   Medication Sig Start Date End Date Taking? Authorizing Provider  aspirin 81 MG tablet Take 81 mg by mouth daily.    Historical Provider, MD  Calcium Citrate 200 MG TABS Take 1 tablet by mouth daily. 08/21/10   Historical Provider, MD  Calcium-Vit D3-Phytosterols [id number] MG-UNIT-MG TABS Take 1 tablet by mouth daily. 04/21/07   Historical Provider, MD  CRESTOR 20 MG tablet Take 1 tablet by mouth every other day.    Historical Provider, MD  docusate sodium (COLACE) 100 MG capsule Take 100 mg by mouth daily. 08/21/10   Historical Provider, MD  finasteride (PROSCAR) 5 MG  tablet Take 1 tablet by mouth daily.    Historical Provider, MD  Glucosamine-Chondroitin 250-200 MG TABS Take 1 tablet by mouth daily. Regular strength tabs. 04/21/07   Historical Provider, MD  ibuprofen (ADVIL,MOTRIN) 200 MG tablet Take 200 mg by mouth every 6 (six) hours as needed.    Historical Provider, MD  meclizine (ANTIVERT) 12.5 MG tablet Take by mouth 3 (three) times daily as needed for dizziness.  12/01/14   Historical Provider, MD  Multiple Vitamin (MULTIVITAMIN WITH MINERALS) TABS tablet Take 1 tablet by mouth daily.    Historical Provider, MD  traMADol Veatrice Bourbon) 50 MG tablet  01/24/15   Historical Provider, MD  vitamin B-12 (CYANOCOBALAMIN) 250 MCG tablet Take  250 mcg by mouth daily.    Historical Provider, MD   BP 134/71 mmHg  Pulse 65  Temp(Src) 98.2 F (36.8 C) (Oral)  Resp 19  Ht 5\' 9"  (1.753 m)  Wt 145 lb (65.772 kg)  BMI 21.40 kg/m2  SpO2 98%   Physical Exam  Constitutional: He is oriented to person, place, and time. He appears well-developed and well-nourished. No distress.  HENT:  Head: Normocephalic and atraumatic.  Mouth/Throat: Oropharynx is clear and moist.  Eyes: Conjunctivae and EOM are normal. Pupils are equal, round, and reactive to light.  Neck: Neck supple. No tracheal deviation present.  Cardiovascular: Normal rate, regular rhythm and normal heart sounds.   Pulmonary/Chest: Effort normal and breath sounds normal. No respiratory distress. He has no wheezes. He has no rales.  Musculoskeletal: Normal range of motion.       Right shoulder: He exhibits normal range of motion, no bony tenderness, no swelling and no deformity.       Right wrist: He exhibits normal range of motion, no tenderness, no bony tenderness and no swelling.       Left wrist: He exhibits normal range of motion, no tenderness, no bony tenderness and no swelling.  Full ROM of all extremities without pain  Neurological: He is alert and oriented to person, place, and time. He has normal strength. No cranial nerve deficit or sensory deficit. Gait normal.  cranial nerves intact.   Skin: Skin is warm and dry. No bruising and no ecchymosis noted.  Psychiatric: He has a normal mood and affect. His behavior is normal.  Nursing note and vitals reviewed.   ED Course  Procedures (including critical care time) DIAGNOSTIC STUDIES: Oxygen Saturation is 98% on RA, normal by my interpretation.    COORDINATION OF CARE: 7:55 PM-Discussed treatment plan which includes imaging with pt at bedside and pt agreed to plan.     Labs Review Labs Reviewed - No data to display  Imaging Review No results found.    EKG Interpretation   Date/Time:  Sunday January 21 2016  20:10:15 EDT Ventricular Rate:  63 PR Interval:    QRS Duration: 104 QT Interval:  397 QTC Calculation: 407 R Axis:   72 Text Interpretation:  Normal sinus rhythm st changes in anterior leads  smilar to previous Confirmed by [facility] MD, Corene Cornea [phone]) on 01/21/2016  11:24:21 PM Also confirmed by [facility] MD, JASON [phone]), editor [place]   CCT, BEVERLY ([id number])  on 01/22/2016 6:54:09 AM     9:10 PM Reassessed patient.  Ambulated in the room without difficulty.  No dizziness or lightheadedness with ambulating. MDM   Final diagnoses:  None  Patient presents today after a likely mechanical fall.  He is unclear exactly what happened.  However, daughter states that he was  ambulating without his walker while wearing socks on a newly waxed floor.  Patient denies feeling dizzy prior to falling.  He denies any CP or SOB.  He did fall backwards and hit his head when he fell and is complaining of neck pain and pain to the back of the head.  CT head and cervical spine negative for acute findings.  Due to the fact that he is unclear about the cause of the fall, labs and EKG ordered.  No ischemic changes on EKG.  Labs unremarkable.  Patient with full ROM of all extremities.  No ttp of the thoracic or lumbar spine.  Patient ambulated in the room without difficulty.  Stable for discharge.  Return precautions given.    I personally performed the services described in this documentation, which was scribed in my presence. The recorded information has been reviewed and is accurate.      Hyman Bible, PA-C 01/23/16 2227  Merrily Pew, MD 01/24/16 1240

## 2016-01-21 NOTE — ED Notes (Signed)
MD at bedside. 

## 2016-01-22 DIAGNOSIS — R351 Nocturia: Secondary | ICD-10-CM | POA: Diagnosis not present

## 2016-01-24 DIAGNOSIS — Z9181 History of falling: Secondary | ICD-10-CM | POA: Diagnosis not present

## 2016-01-24 DIAGNOSIS — Z87891 Personal history of nicotine dependence: Secondary | ICD-10-CM | POA: Diagnosis not present

## 2016-01-24 DIAGNOSIS — Z7982 Long term (current) use of aspirin: Secondary | ICD-10-CM | POA: Diagnosis not present

## 2016-01-24 DIAGNOSIS — M4806 Spinal stenosis, lumbar region: Secondary | ICD-10-CM | POA: Diagnosis not present

## 2016-01-24 DIAGNOSIS — M4316 Spondylolisthesis, lumbar region: Secondary | ICD-10-CM | POA: Diagnosis not present

## 2016-01-24 DIAGNOSIS — M5137 Other intervertebral disc degeneration, lumbosacral region: Secondary | ICD-10-CM | POA: Diagnosis not present

## 2016-01-25 DIAGNOSIS — N400 Enlarged prostate without lower urinary tract symptoms: Secondary | ICD-10-CM | POA: Diagnosis not present

## 2016-01-25 DIAGNOSIS — S22080D Wedge compression fracture of T11-T12 vertebra, subsequent encounter for fracture with routine healing: Secondary | ICD-10-CM | POA: Diagnosis not present

## 2016-01-25 DIAGNOSIS — E782 Mixed hyperlipidemia: Secondary | ICD-10-CM | POA: Diagnosis not present

## 2016-01-25 DIAGNOSIS — Z8505 Personal history of malignant neoplasm of liver: Secondary | ICD-10-CM | POA: Diagnosis not present

## 2016-01-25 DIAGNOSIS — R42 Dizziness and giddiness: Secondary | ICD-10-CM | POA: Diagnosis not present

## 2016-01-25 DIAGNOSIS — R7303 Prediabetes: Secondary | ICD-10-CM | POA: Diagnosis not present

## 2016-01-25 DIAGNOSIS — Z1389 Encounter for screening for other disorder: Secondary | ICD-10-CM | POA: Diagnosis not present

## 2016-01-25 DIAGNOSIS — E871 Hypo-osmolality and hyponatremia: Secondary | ICD-10-CM | POA: Diagnosis not present

## 2016-01-25 DIAGNOSIS — M545 Low back pain: Secondary | ICD-10-CM | POA: Diagnosis not present

## 2016-01-25 DIAGNOSIS — N3281 Overactive bladder: Secondary | ICD-10-CM | POA: Diagnosis not present

## 2016-01-29 DIAGNOSIS — Z9181 History of falling: Secondary | ICD-10-CM | POA: Diagnosis not present

## 2016-01-29 DIAGNOSIS — Z87891 Personal history of nicotine dependence: Secondary | ICD-10-CM | POA: Diagnosis not present

## 2016-01-29 DIAGNOSIS — M4806 Spinal stenosis, lumbar region: Secondary | ICD-10-CM | POA: Diagnosis not present

## 2016-01-29 DIAGNOSIS — M5137 Other intervertebral disc degeneration, lumbosacral region: Secondary | ICD-10-CM | POA: Diagnosis not present

## 2016-01-29 DIAGNOSIS — M4316 Spondylolisthesis, lumbar region: Secondary | ICD-10-CM | POA: Diagnosis not present

## 2016-01-29 DIAGNOSIS — Z7982 Long term (current) use of aspirin: Secondary | ICD-10-CM | POA: Diagnosis not present

## 2016-01-31 DIAGNOSIS — M4316 Spondylolisthesis, lumbar region: Secondary | ICD-10-CM | POA: Diagnosis not present

## 2016-01-31 DIAGNOSIS — Z9181 History of falling: Secondary | ICD-10-CM | POA: Diagnosis not present

## 2016-01-31 DIAGNOSIS — Z7982 Long term (current) use of aspirin: Secondary | ICD-10-CM | POA: Diagnosis not present

## 2016-01-31 DIAGNOSIS — M5137 Other intervertebral disc degeneration, lumbosacral region: Secondary | ICD-10-CM | POA: Diagnosis not present

## 2016-01-31 DIAGNOSIS — Z87891 Personal history of nicotine dependence: Secondary | ICD-10-CM | POA: Diagnosis not present

## 2016-01-31 DIAGNOSIS — M4806 Spinal stenosis, lumbar region: Secondary | ICD-10-CM | POA: Diagnosis not present

## 2016-02-06 DIAGNOSIS — Z7982 Long term (current) use of aspirin: Secondary | ICD-10-CM | POA: Diagnosis not present

## 2016-02-06 DIAGNOSIS — Z87891 Personal history of nicotine dependence: Secondary | ICD-10-CM | POA: Diagnosis not present

## 2016-02-06 DIAGNOSIS — M4806 Spinal stenosis, lumbar region: Secondary | ICD-10-CM | POA: Diagnosis not present

## 2016-02-06 DIAGNOSIS — M5137 Other intervertebral disc degeneration, lumbosacral region: Secondary | ICD-10-CM | POA: Diagnosis not present

## 2016-02-06 DIAGNOSIS — M4316 Spondylolisthesis, lumbar region: Secondary | ICD-10-CM | POA: Diagnosis not present

## 2016-02-06 DIAGNOSIS — Z9181 History of falling: Secondary | ICD-10-CM | POA: Diagnosis not present

## 2016-02-08 DIAGNOSIS — Z7982 Long term (current) use of aspirin: Secondary | ICD-10-CM | POA: Diagnosis not present

## 2016-02-08 DIAGNOSIS — Z9181 History of falling: Secondary | ICD-10-CM | POA: Diagnosis not present

## 2016-02-08 DIAGNOSIS — M4316 Spondylolisthesis, lumbar region: Secondary | ICD-10-CM | POA: Diagnosis not present

## 2016-02-08 DIAGNOSIS — Z87891 Personal history of nicotine dependence: Secondary | ICD-10-CM | POA: Diagnosis not present

## 2016-02-08 DIAGNOSIS — M4806 Spinal stenosis, lumbar region: Secondary | ICD-10-CM | POA: Diagnosis not present

## 2016-02-08 DIAGNOSIS — M5137 Other intervertebral disc degeneration, lumbosacral region: Secondary | ICD-10-CM | POA: Diagnosis not present

## 2016-02-09 DIAGNOSIS — H02401 Unspecified ptosis of right eyelid: Secondary | ICD-10-CM | POA: Diagnosis not present

## 2016-02-09 DIAGNOSIS — H02402 Unspecified ptosis of left eyelid: Secondary | ICD-10-CM | POA: Diagnosis not present

## 2016-02-15 DIAGNOSIS — M4806 Spinal stenosis, lumbar region: Secondary | ICD-10-CM | POA: Diagnosis not present

## 2016-02-15 DIAGNOSIS — Z7982 Long term (current) use of aspirin: Secondary | ICD-10-CM | POA: Diagnosis not present

## 2016-02-15 DIAGNOSIS — Z9181 History of falling: Secondary | ICD-10-CM | POA: Diagnosis not present

## 2016-02-15 DIAGNOSIS — M5137 Other intervertebral disc degeneration, lumbosacral region: Secondary | ICD-10-CM | POA: Diagnosis not present

## 2016-02-15 DIAGNOSIS — M4316 Spondylolisthesis, lumbar region: Secondary | ICD-10-CM | POA: Diagnosis not present

## 2016-02-15 DIAGNOSIS — Z87891 Personal history of nicotine dependence: Secondary | ICD-10-CM | POA: Diagnosis not present

## 2016-02-20 DIAGNOSIS — M4316 Spondylolisthesis, lumbar region: Secondary | ICD-10-CM | POA: Diagnosis not present

## 2016-02-20 DIAGNOSIS — Z9181 History of falling: Secondary | ICD-10-CM | POA: Diagnosis not present

## 2016-02-20 DIAGNOSIS — Z87891 Personal history of nicotine dependence: Secondary | ICD-10-CM | POA: Diagnosis not present

## 2016-02-20 DIAGNOSIS — M5137 Other intervertebral disc degeneration, lumbosacral region: Secondary | ICD-10-CM | POA: Diagnosis not present

## 2016-02-20 DIAGNOSIS — Z7982 Long term (current) use of aspirin: Secondary | ICD-10-CM | POA: Diagnosis not present

## 2016-02-20 DIAGNOSIS — M4806 Spinal stenosis, lumbar region: Secondary | ICD-10-CM | POA: Diagnosis not present

## 2016-02-23 ENCOUNTER — Other Ambulatory Visit: Payer: Self-pay | Admitting: Family Medicine

## 2016-02-23 DIAGNOSIS — M5440 Lumbago with sciatica, unspecified side: Secondary | ICD-10-CM

## 2016-02-27 DIAGNOSIS — M5137 Other intervertebral disc degeneration, lumbosacral region: Secondary | ICD-10-CM | POA: Diagnosis not present

## 2016-02-27 DIAGNOSIS — Z87891 Personal history of nicotine dependence: Secondary | ICD-10-CM | POA: Diagnosis not present

## 2016-02-27 DIAGNOSIS — Z7982 Long term (current) use of aspirin: Secondary | ICD-10-CM | POA: Diagnosis not present

## 2016-02-27 DIAGNOSIS — M4806 Spinal stenosis, lumbar region: Secondary | ICD-10-CM | POA: Diagnosis not present

## 2016-02-27 DIAGNOSIS — Z9181 History of falling: Secondary | ICD-10-CM | POA: Diagnosis not present

## 2016-02-27 DIAGNOSIS — M4316 Spondylolisthesis, lumbar region: Secondary | ICD-10-CM | POA: Diagnosis not present

## 2016-03-01 ENCOUNTER — Other Ambulatory Visit: Payer: Medicare Other

## 2016-03-04 ENCOUNTER — Ambulatory Visit
Admission: RE | Admit: 2016-03-04 | Discharge: 2016-03-04 | Disposition: A | Discharge status: Home / Self Care | Payer: Medicare Other | Source: Ambulatory Visit | Attending: Family Medicine | Admitting: Family Medicine

## 2016-03-04 DIAGNOSIS — M47817 Spondylosis without myelopathy or radiculopathy, lumbosacral region: Secondary | ICD-10-CM | POA: Diagnosis not present

## 2016-03-04 DIAGNOSIS — M5440 Lumbago with sciatica, unspecified side: Secondary | ICD-10-CM

## 2016-03-04 MED ORDER — IOPAMIDOL (ISOVUE-M 200) INJECTION 41%
1.0000 mL | Freq: Once | INTRAMUSCULAR | Status: AC
  Administered 2016-03-04: 1 mL via EPIDURAL

## 2016-03-04 MED ORDER — METHYLPREDNISOLONE ACETATE 40 MG/ML INJ SUSP (RADIOLOG
120.0000 mg | Freq: Once | INTRAMUSCULAR | Status: AC
  Administered 2016-03-04: 120 mg via EPIDURAL

## 2016-03-04 NOTE — Discharge Instructions (Signed)

## 2016-03-07 DIAGNOSIS — M4316 Spondylolisthesis, lumbar region: Secondary | ICD-10-CM | POA: Diagnosis not present

## 2016-03-07 DIAGNOSIS — Z87891 Personal history of nicotine dependence: Secondary | ICD-10-CM | POA: Diagnosis not present

## 2016-03-07 DIAGNOSIS — Z7982 Long term (current) use of aspirin: Secondary | ICD-10-CM | POA: Diagnosis not present

## 2016-03-07 DIAGNOSIS — M4806 Spinal stenosis, lumbar region: Secondary | ICD-10-CM | POA: Diagnosis not present

## 2016-03-07 DIAGNOSIS — Z9181 History of falling: Secondary | ICD-10-CM | POA: Diagnosis not present

## 2016-03-07 DIAGNOSIS — M5137 Other intervertebral disc degeneration, lumbosacral region: Secondary | ICD-10-CM | POA: Diagnosis not present

## 2016-03-13 DIAGNOSIS — Z9181 History of falling: Secondary | ICD-10-CM | POA: Diagnosis not present

## 2016-03-13 DIAGNOSIS — M4806 Spinal stenosis, lumbar region: Secondary | ICD-10-CM | POA: Diagnosis not present

## 2016-03-13 DIAGNOSIS — M4316 Spondylolisthesis, lumbar region: Secondary | ICD-10-CM | POA: Diagnosis not present

## 2016-03-13 DIAGNOSIS — Z87891 Personal history of nicotine dependence: Secondary | ICD-10-CM | POA: Diagnosis not present

## 2016-03-13 DIAGNOSIS — M5137 Other intervertebral disc degeneration, lumbosacral region: Secondary | ICD-10-CM | POA: Diagnosis not present

## 2016-03-13 DIAGNOSIS — Z7982 Long term (current) use of aspirin: Secondary | ICD-10-CM | POA: Diagnosis not present

## 2016-03-19 DIAGNOSIS — M4806 Spinal stenosis, lumbar region: Secondary | ICD-10-CM | POA: Diagnosis not present

## 2016-03-19 DIAGNOSIS — Z87891 Personal history of nicotine dependence: Secondary | ICD-10-CM | POA: Diagnosis not present

## 2016-03-19 DIAGNOSIS — M4316 Spondylolisthesis, lumbar region: Secondary | ICD-10-CM | POA: Diagnosis not present

## 2016-03-19 DIAGNOSIS — Z9181 History of falling: Secondary | ICD-10-CM | POA: Diagnosis not present

## 2016-03-19 DIAGNOSIS — Z7982 Long term (current) use of aspirin: Secondary | ICD-10-CM | POA: Diagnosis not present

## 2016-03-19 DIAGNOSIS — M5137 Other intervertebral disc degeneration, lumbosacral region: Secondary | ICD-10-CM | POA: Diagnosis not present

## 2016-03-28 DIAGNOSIS — S32000A Wedge compression fracture of unspecified lumbar vertebra, initial encounter for closed fracture: Secondary | ICD-10-CM | POA: Diagnosis not present

## 2016-04-05 DIAGNOSIS — M4696 Unspecified inflammatory spondylopathy, lumbar region: Secondary | ICD-10-CM | POA: Diagnosis not present

## 2016-04-05 DIAGNOSIS — M5416 Radiculopathy, lumbar region: Secondary | ICD-10-CM | POA: Diagnosis not present

## 2016-04-05 DIAGNOSIS — M5136 Other intervertebral disc degeneration, lumbar region: Secondary | ICD-10-CM | POA: Diagnosis not present

## 2016-04-12 DIAGNOSIS — M5136 Other intervertebral disc degeneration, lumbar region: Secondary | ICD-10-CM | POA: Diagnosis not present

## 2016-04-12 DIAGNOSIS — M5116 Intervertebral disc disorders with radiculopathy, lumbar region: Secondary | ICD-10-CM | POA: Diagnosis not present

## 2016-04-12 DIAGNOSIS — M419 Scoliosis, unspecified: Secondary | ICD-10-CM | POA: Diagnosis not present

## 2016-04-12 DIAGNOSIS — M5416 Radiculopathy, lumbar region: Secondary | ICD-10-CM | POA: Diagnosis not present

## 2016-04-12 DIAGNOSIS — M545 Low back pain: Secondary | ICD-10-CM | POA: Diagnosis not present

## 2016-04-12 DIAGNOSIS — M4696 Unspecified inflammatory spondylopathy, lumbar region: Secondary | ICD-10-CM | POA: Diagnosis not present

## 2016-04-12 DIAGNOSIS — M4726 Other spondylosis with radiculopathy, lumbar region: Secondary | ICD-10-CM | POA: Diagnosis not present

## 2016-04-12 DIAGNOSIS — M5117 Intervertebral disc disorders with radiculopathy, lumbosacral region: Secondary | ICD-10-CM | POA: Diagnosis not present

## 2016-04-12 DIAGNOSIS — S3992XA Unspecified injury of lower back, initial encounter: Secondary | ICD-10-CM | POA: Diagnosis not present

## 2016-04-14 DIAGNOSIS — Z23 Encounter for immunization: Secondary | ICD-10-CM | POA: Diagnosis not present

## 2016-05-07 DIAGNOSIS — M779 Enthesopathy, unspecified: Secondary | ICD-10-CM | POA: Diagnosis not present

## 2016-06-13 ENCOUNTER — Other Ambulatory Visit: Payer: Self-pay | Admitting: Family Medicine

## 2016-06-13 DIAGNOSIS — M545 Low back pain, unspecified: Secondary | ICD-10-CM

## 2016-06-13 DIAGNOSIS — G8929 Other chronic pain: Secondary | ICD-10-CM

## 2016-06-17 ENCOUNTER — Ambulatory Visit
Admission: RE | Admit: 2016-06-17 | Discharge: 2016-06-17 | Disposition: A | Discharge status: Home / Self Care | Payer: Medicare Other | Source: Ambulatory Visit | Attending: Family Medicine | Admitting: Family Medicine

## 2016-06-17 DIAGNOSIS — M545 Low back pain: Secondary | ICD-10-CM | POA: Diagnosis not present

## 2016-06-17 DIAGNOSIS — G8929 Other chronic pain: Secondary | ICD-10-CM

## 2016-06-17 MED ORDER — IOPAMIDOL (ISOVUE-M 200) INJECTION 41%: 15.0000 mL | Freq: Once | INTRAMUSCULAR | Status: DC

## 2016-06-17 MED ORDER — IOPAMIDOL (ISOVUE-M 200) INJECTION 41%
1.0000 mL | Freq: Once | INTRAMUSCULAR | Status: AC
  Administered 2016-06-17: 1 mL via EPIDURAL

## 2016-06-17 MED ORDER — METHYLPREDNISOLONE ACETATE 40 MG/ML INJ SUSP (RADIOLOG
120.0000 mg | Freq: Once | INTRAMUSCULAR | Status: AC
  Administered 2016-06-17: 120 mg via EPIDURAL

## 2016-07-01 DIAGNOSIS — L309 Dermatitis, unspecified: Secondary | ICD-10-CM | POA: Diagnosis not present

## 2016-08-13 DIAGNOSIS — E782 Mixed hyperlipidemia: Secondary | ICD-10-CM | POA: Diagnosis not present

## 2016-08-13 DIAGNOSIS — M545 Low back pain: Secondary | ICD-10-CM | POA: Diagnosis not present

## 2016-08-13 DIAGNOSIS — N3281 Overactive bladder: Secondary | ICD-10-CM | POA: Diagnosis not present

## 2016-08-13 DIAGNOSIS — N400 Enlarged prostate without lower urinary tract symptoms: Secondary | ICD-10-CM | POA: Diagnosis not present

## 2016-08-13 DIAGNOSIS — R7303 Prediabetes: Secondary | ICD-10-CM | POA: Diagnosis not present

## 2016-08-13 DIAGNOSIS — Z8505 Personal history of malignant neoplasm of liver: Secondary | ICD-10-CM | POA: Diagnosis not present

## 2016-08-13 DIAGNOSIS — S22080D Wedge compression fracture of T11-T12 vertebra, subsequent encounter for fracture with routine healing: Secondary | ICD-10-CM | POA: Diagnosis not present

## 2016-08-13 DIAGNOSIS — E871 Hypo-osmolality and hyponatremia: Secondary | ICD-10-CM | POA: Diagnosis not present

## 2016-08-13 DIAGNOSIS — R42 Dizziness and giddiness: Secondary | ICD-10-CM | POA: Diagnosis not present

## 2016-08-30 ENCOUNTER — Other Ambulatory Visit: Payer: Self-pay | Admitting: Family Medicine

## 2016-08-30 DIAGNOSIS — M545 Low back pain: Principal | ICD-10-CM

## 2016-08-30 DIAGNOSIS — G8929 Other chronic pain: Secondary | ICD-10-CM

## 2016-09-16 ENCOUNTER — Ambulatory Visit
Admission: RE | Admit: 2016-09-16 | Discharge: 2016-09-16 | Disposition: A | Discharge status: Home / Self Care | Payer: Medicare Other | Source: Ambulatory Visit | Attending: Family Medicine | Admitting: Family Medicine

## 2016-09-16 DIAGNOSIS — M545 Low back pain: Secondary | ICD-10-CM | POA: Diagnosis not present

## 2016-09-16 DIAGNOSIS — G8929 Other chronic pain: Secondary | ICD-10-CM

## 2016-09-16 MED ORDER — METHYLPREDNISOLONE ACETATE 40 MG/ML INJ SUSP (RADIOLOG
120.0000 mg | Freq: Once | INTRAMUSCULAR | Status: AC
  Administered 2016-09-16: 120 mg via EPIDURAL

## 2016-09-16 MED ORDER — IOPAMIDOL (ISOVUE-M 200) INJECTION 41%
1.0000 mL | Freq: Once | INTRAMUSCULAR | Status: AC
  Administered 2016-09-16: 1 mL via EPIDURAL

## 2016-09-16 NOTE — Discharge Instructions (Signed)

## 2016-09-18 ENCOUNTER — Other Ambulatory Visit: Payer: Self-pay | Admitting: Family Medicine

## 2016-09-18 DIAGNOSIS — M545 Low back pain: Principal | ICD-10-CM

## 2016-09-18 DIAGNOSIS — G8929 Other chronic pain: Secondary | ICD-10-CM

## 2016-11-12 ENCOUNTER — Ambulatory Visit
Admission: RE | Admit: 2016-11-12 | Discharge: 2016-11-12 | Disposition: A | Discharge status: Home / Self Care | Payer: Medicare Other | Source: Ambulatory Visit | Attending: Family Medicine | Admitting: Family Medicine

## 2016-11-12 DIAGNOSIS — M545 Low back pain: Principal | ICD-10-CM

## 2016-11-12 DIAGNOSIS — G8929 Other chronic pain: Secondary | ICD-10-CM

## 2016-11-12 DIAGNOSIS — M47817 Spondylosis without myelopathy or radiculopathy, lumbosacral region: Secondary | ICD-10-CM | POA: Diagnosis not present

## 2016-11-12 MED ORDER — METHYLPREDNISOLONE ACETATE 40 MG/ML INJ SUSP (RADIOLOG: 120.0000 mg | Freq: Once | INTRAMUSCULAR | Status: DC

## 2016-11-12 MED ORDER — IOPAMIDOL (ISOVUE-M 200) INJECTION 41%: 1.0000 mL | Freq: Once | INTRAMUSCULAR | Status: DC

## 2016-11-12 NOTE — Discharge Instructions (Signed)

## 2016-11-27 DIAGNOSIS — R5381 Other malaise: Secondary | ICD-10-CM | POA: Diagnosis not present

## 2016-11-27 DIAGNOSIS — H8111 Benign paroxysmal vertigo, right ear: Secondary | ICD-10-CM | POA: Diagnosis not present

## 2016-11-27 DIAGNOSIS — M545 Low back pain: Secondary | ICD-10-CM | POA: Diagnosis not present

## 2016-12-02 DIAGNOSIS — Z8781 Personal history of (healed) traumatic fracture: Secondary | ICD-10-CM | POA: Diagnosis not present

## 2016-12-02 DIAGNOSIS — R5381 Other malaise: Secondary | ICD-10-CM | POA: Diagnosis not present

## 2016-12-02 DIAGNOSIS — H8111 Benign paroxysmal vertigo, right ear: Secondary | ICD-10-CM | POA: Diagnosis not present

## 2016-12-02 DIAGNOSIS — Z9181 History of falling: Secondary | ICD-10-CM | POA: Diagnosis not present

## 2016-12-02 DIAGNOSIS — M545 Low back pain: Secondary | ICD-10-CM | POA: Diagnosis not present

## 2016-12-03 DIAGNOSIS — W19XXXA Unspecified fall, initial encounter: Secondary | ICD-10-CM | POA: Diagnosis not present

## 2016-12-03 DIAGNOSIS — Z8505 Personal history of malignant neoplasm of liver: Secondary | ICD-10-CM | POA: Diagnosis not present

## 2016-12-03 DIAGNOSIS — Z9049 Acquired absence of other specified parts of digestive tract: Secondary | ICD-10-CM | POA: Diagnosis not present

## 2016-12-03 DIAGNOSIS — Z08 Encounter for follow-up examination after completed treatment for malignant neoplasm: Secondary | ICD-10-CM | POA: Diagnosis not present

## 2016-12-03 DIAGNOSIS — S0091XA Abrasion of unspecified part of head, initial encounter: Secondary | ICD-10-CM | POA: Diagnosis not present

## 2016-12-03 DIAGNOSIS — Z905 Acquired absence of kidney: Secondary | ICD-10-CM | POA: Diagnosis not present

## 2016-12-03 DIAGNOSIS — G8929 Other chronic pain: Secondary | ICD-10-CM | POA: Diagnosis not present

## 2016-12-03 DIAGNOSIS — C220 Liver cell carcinoma: Secondary | ICD-10-CM | POA: Diagnosis not present

## 2016-12-03 DIAGNOSIS — M549 Dorsalgia, unspecified: Secondary | ICD-10-CM | POA: Diagnosis not present

## 2016-12-03 DIAGNOSIS — S098XXA Other specified injuries of head, initial encounter: Secondary | ICD-10-CM | POA: Diagnosis not present

## 2016-12-03 DIAGNOSIS — W050XXA Fall from non-moving wheelchair, initial encounter: Secondary | ICD-10-CM | POA: Diagnosis not present

## 2016-12-03 DIAGNOSIS — Y999 Unspecified external cause status: Secondary | ICD-10-CM | POA: Diagnosis not present

## 2016-12-03 DIAGNOSIS — S0081XA Abrasion of other part of head, initial encounter: Secondary | ICD-10-CM | POA: Diagnosis not present

## 2016-12-03 DIAGNOSIS — R918 Other nonspecific abnormal finding of lung field: Secondary | ICD-10-CM | POA: Diagnosis not present

## 2016-12-04 DIAGNOSIS — Z961 Presence of intraocular lens: Secondary | ICD-10-CM | POA: Diagnosis not present

## 2016-12-04 DIAGNOSIS — D485 Neoplasm of uncertain behavior of skin: Secondary | ICD-10-CM | POA: Diagnosis not present

## 2016-12-04 DIAGNOSIS — H52203 Unspecified astigmatism, bilateral: Secondary | ICD-10-CM | POA: Diagnosis not present

## 2016-12-05 DIAGNOSIS — M545 Low back pain: Secondary | ICD-10-CM | POA: Diagnosis not present

## 2016-12-05 DIAGNOSIS — H8111 Benign paroxysmal vertigo, right ear: Secondary | ICD-10-CM | POA: Diagnosis not present

## 2016-12-05 DIAGNOSIS — R5381 Other malaise: Secondary | ICD-10-CM | POA: Diagnosis not present

## 2016-12-05 DIAGNOSIS — Z9181 History of falling: Secondary | ICD-10-CM | POA: Diagnosis not present

## 2016-12-05 DIAGNOSIS — Z8781 Personal history of (healed) traumatic fracture: Secondary | ICD-10-CM | POA: Diagnosis not present

## 2016-12-10 DIAGNOSIS — Z9181 History of falling: Secondary | ICD-10-CM | POA: Diagnosis not present

## 2016-12-10 DIAGNOSIS — R5381 Other malaise: Secondary | ICD-10-CM | POA: Diagnosis not present

## 2016-12-10 DIAGNOSIS — Z8781 Personal history of (healed) traumatic fracture: Secondary | ICD-10-CM | POA: Diagnosis not present

## 2016-12-10 DIAGNOSIS — H8111 Benign paroxysmal vertigo, right ear: Secondary | ICD-10-CM | POA: Diagnosis not present

## 2016-12-10 DIAGNOSIS — M545 Low back pain: Secondary | ICD-10-CM | POA: Diagnosis not present

## 2016-12-13 DIAGNOSIS — R5381 Other malaise: Secondary | ICD-10-CM | POA: Diagnosis not present

## 2016-12-13 DIAGNOSIS — H8111 Benign paroxysmal vertigo, right ear: Secondary | ICD-10-CM | POA: Diagnosis not present

## 2016-12-13 DIAGNOSIS — Z9181 History of falling: Secondary | ICD-10-CM | POA: Diagnosis not present

## 2016-12-13 DIAGNOSIS — Z8781 Personal history of (healed) traumatic fracture: Secondary | ICD-10-CM | POA: Diagnosis not present

## 2016-12-13 DIAGNOSIS — M545 Low back pain: Secondary | ICD-10-CM | POA: Diagnosis not present

## 2016-12-16 ENCOUNTER — Other Ambulatory Visit: Payer: Self-pay | Admitting: Ophthalmology

## 2016-12-16 DIAGNOSIS — L821 Other seborrheic keratosis: Secondary | ICD-10-CM | POA: Diagnosis not present

## 2016-12-16 DIAGNOSIS — D485 Neoplasm of uncertain behavior of skin: Secondary | ICD-10-CM | POA: Diagnosis not present

## 2016-12-16 DIAGNOSIS — L570 Actinic keratosis: Secondary | ICD-10-CM | POA: Diagnosis not present

## 2016-12-16 DIAGNOSIS — L432 Lichenoid drug reaction: Secondary | ICD-10-CM | POA: Diagnosis not present

## 2016-12-17 DIAGNOSIS — R5381 Other malaise: Secondary | ICD-10-CM | POA: Diagnosis not present

## 2016-12-17 DIAGNOSIS — Z9181 History of falling: Secondary | ICD-10-CM | POA: Diagnosis not present

## 2016-12-17 DIAGNOSIS — Z8781 Personal history of (healed) traumatic fracture: Secondary | ICD-10-CM | POA: Diagnosis not present

## 2016-12-17 DIAGNOSIS — H8111 Benign paroxysmal vertigo, right ear: Secondary | ICD-10-CM | POA: Diagnosis not present

## 2016-12-17 DIAGNOSIS — M545 Low back pain: Secondary | ICD-10-CM | POA: Diagnosis not present

## 2016-12-19 DIAGNOSIS — H8111 Benign paroxysmal vertigo, right ear: Secondary | ICD-10-CM | POA: Diagnosis not present

## 2016-12-19 DIAGNOSIS — Z9181 History of falling: Secondary | ICD-10-CM | POA: Diagnosis not present

## 2016-12-19 DIAGNOSIS — M545 Low back pain: Secondary | ICD-10-CM | POA: Diagnosis not present

## 2016-12-19 DIAGNOSIS — Z8781 Personal history of (healed) traumatic fracture: Secondary | ICD-10-CM | POA: Diagnosis not present

## 2016-12-19 DIAGNOSIS — R5381 Other malaise: Secondary | ICD-10-CM | POA: Diagnosis not present

## 2016-12-24 DIAGNOSIS — M545 Low back pain: Secondary | ICD-10-CM | POA: Diagnosis not present

## 2016-12-24 DIAGNOSIS — Z8781 Personal history of (healed) traumatic fracture: Secondary | ICD-10-CM | POA: Diagnosis not present

## 2016-12-24 DIAGNOSIS — H8111 Benign paroxysmal vertigo, right ear: Secondary | ICD-10-CM | POA: Diagnosis not present

## 2016-12-24 DIAGNOSIS — R5381 Other malaise: Secondary | ICD-10-CM | POA: Diagnosis not present

## 2016-12-24 DIAGNOSIS — Z9181 History of falling: Secondary | ICD-10-CM | POA: Diagnosis not present

## 2016-12-25 ENCOUNTER — Other Ambulatory Visit: Payer: Self-pay | Admitting: Family Medicine

## 2016-12-25 DIAGNOSIS — M545 Low back pain: Principal | ICD-10-CM

## 2016-12-25 DIAGNOSIS — G8929 Other chronic pain: Secondary | ICD-10-CM

## 2016-12-26 DIAGNOSIS — M545 Low back pain: Secondary | ICD-10-CM | POA: Diagnosis not present

## 2016-12-26 DIAGNOSIS — Z8781 Personal history of (healed) traumatic fracture: Secondary | ICD-10-CM | POA: Diagnosis not present

## 2016-12-26 DIAGNOSIS — R5381 Other malaise: Secondary | ICD-10-CM | POA: Diagnosis not present

## 2016-12-26 DIAGNOSIS — Z9181 History of falling: Secondary | ICD-10-CM | POA: Diagnosis not present

## 2016-12-26 DIAGNOSIS — H8111 Benign paroxysmal vertigo, right ear: Secondary | ICD-10-CM | POA: Diagnosis not present

## 2017-01-01 ENCOUNTER — Other Ambulatory Visit: Payer: Medicare Other

## 2017-01-02 ENCOUNTER — Ambulatory Visit
Admission: RE | Admit: 2017-01-02 | Discharge: 2017-01-02 | Disposition: A | Discharge status: Home / Self Care | Payer: Medicare Other | Source: Ambulatory Visit | Attending: Family Medicine | Admitting: Family Medicine

## 2017-01-02 DIAGNOSIS — M545 Low back pain: Principal | ICD-10-CM

## 2017-01-02 DIAGNOSIS — G8929 Other chronic pain: Secondary | ICD-10-CM

## 2017-01-02 MED ORDER — METHYLPREDNISOLONE ACETATE 40 MG/ML INJ SUSP (RADIOLOG
120.0000 mg | Freq: Once | INTRAMUSCULAR | Status: AC
  Administered 2017-01-02: 120 mg via EPIDURAL

## 2017-01-02 MED ORDER — IOPAMIDOL (ISOVUE-M 200) INJECTION 41%
1.0000 mL | Freq: Once | INTRAMUSCULAR | Status: AC
  Administered 2017-01-02: 1 mL via EPIDURAL

## 2017-01-02 NOTE — Discharge Instructions (Signed)

## 2017-01-06 DIAGNOSIS — H8111 Benign paroxysmal vertigo, right ear: Secondary | ICD-10-CM | POA: Diagnosis not present

## 2017-01-06 DIAGNOSIS — Z9181 History of falling: Secondary | ICD-10-CM | POA: Diagnosis not present

## 2017-01-06 DIAGNOSIS — R5381 Other malaise: Secondary | ICD-10-CM | POA: Diagnosis not present

## 2017-01-06 DIAGNOSIS — Z8781 Personal history of (healed) traumatic fracture: Secondary | ICD-10-CM | POA: Diagnosis not present

## 2017-01-06 DIAGNOSIS — M545 Low back pain: Secondary | ICD-10-CM | POA: Diagnosis not present

## 2017-01-14 DIAGNOSIS — M545 Low back pain: Secondary | ICD-10-CM | POA: Diagnosis not present

## 2017-01-14 DIAGNOSIS — Z8781 Personal history of (healed) traumatic fracture: Secondary | ICD-10-CM | POA: Diagnosis not present

## 2017-01-14 DIAGNOSIS — Z9181 History of falling: Secondary | ICD-10-CM | POA: Diagnosis not present

## 2017-01-14 DIAGNOSIS — R5381 Other malaise: Secondary | ICD-10-CM | POA: Diagnosis not present

## 2017-01-14 DIAGNOSIS — H8111 Benign paroxysmal vertigo, right ear: Secondary | ICD-10-CM | POA: Diagnosis not present

## 2017-01-21 DIAGNOSIS — Z8781 Personal history of (healed) traumatic fracture: Secondary | ICD-10-CM | POA: Diagnosis not present

## 2017-01-21 DIAGNOSIS — M545 Low back pain: Secondary | ICD-10-CM | POA: Diagnosis not present

## 2017-01-21 DIAGNOSIS — H8111 Benign paroxysmal vertigo, right ear: Secondary | ICD-10-CM | POA: Diagnosis not present

## 2017-01-21 DIAGNOSIS — R5381 Other malaise: Secondary | ICD-10-CM | POA: Diagnosis not present

## 2017-01-21 DIAGNOSIS — Z9181 History of falling: Secondary | ICD-10-CM | POA: Diagnosis not present

## 2017-02-04 DIAGNOSIS — M545 Low back pain: Secondary | ICD-10-CM | POA: Diagnosis not present

## 2017-02-04 DIAGNOSIS — S22080D Wedge compression fracture of T11-T12 vertebra, subsequent encounter for fracture with routine healing: Secondary | ICD-10-CM | POA: Diagnosis not present

## 2017-02-04 DIAGNOSIS — N400 Enlarged prostate without lower urinary tract symptoms: Secondary | ICD-10-CM | POA: Diagnosis not present

## 2017-02-04 DIAGNOSIS — R7303 Prediabetes: Secondary | ICD-10-CM | POA: Diagnosis not present

## 2017-02-04 DIAGNOSIS — L309 Dermatitis, unspecified: Secondary | ICD-10-CM | POA: Diagnosis not present

## 2017-02-04 DIAGNOSIS — E782 Mixed hyperlipidemia: Secondary | ICD-10-CM | POA: Diagnosis not present

## 2017-02-04 DIAGNOSIS — R42 Dizziness and giddiness: Secondary | ICD-10-CM | POA: Diagnosis not present

## 2017-02-04 DIAGNOSIS — N3281 Overactive bladder: Secondary | ICD-10-CM | POA: Diagnosis not present

## 2017-02-04 DIAGNOSIS — Z8505 Personal history of malignant neoplasm of liver: Secondary | ICD-10-CM | POA: Diagnosis not present

## 2017-02-04 DIAGNOSIS — D692 Other nonthrombocytopenic purpura: Secondary | ICD-10-CM | POA: Diagnosis not present

## 2017-02-04 DIAGNOSIS — Z1389 Encounter for screening for other disorder: Secondary | ICD-10-CM | POA: Diagnosis not present

## 2017-02-13 ENCOUNTER — Other Ambulatory Visit: Payer: Self-pay | Admitting: Family Medicine

## 2017-02-13 DIAGNOSIS — M545 Low back pain: Principal | ICD-10-CM

## 2017-02-13 DIAGNOSIS — G8929 Other chronic pain: Secondary | ICD-10-CM

## 2017-02-17 ENCOUNTER — Other Ambulatory Visit: Payer: Self-pay | Admitting: Family Medicine

## 2017-02-17 ENCOUNTER — Ambulatory Visit
Admission: RE | Admit: 2017-02-17 | Discharge: 2017-02-17 | Disposition: A | Discharge status: Home / Self Care | Payer: Medicare Other | Source: Ambulatory Visit | Attending: Family Medicine | Admitting: Family Medicine

## 2017-02-17 DIAGNOSIS — G8929 Other chronic pain: Secondary | ICD-10-CM

## 2017-02-17 DIAGNOSIS — M545 Low back pain: Principal | ICD-10-CM

## 2017-02-17 MED ORDER — METHYLPREDNISOLONE ACETATE 40 MG/ML INJ SUSP (RADIOLOG
120.0000 mg | Freq: Once | INTRAMUSCULAR | Status: AC
  Administered 2017-02-17: 120 mg via EPIDURAL

## 2017-02-17 MED ORDER — IOPAMIDOL (ISOVUE-M 200) INJECTION 41%
1.0000 mL | Freq: Once | INTRAMUSCULAR | Status: AC
  Administered 2017-02-17: 1 mL via EPIDURAL

## 2017-02-17 NOTE — Discharge Instructions (Signed)

## 2017-03-11 DIAGNOSIS — L309 Dermatitis, unspecified: Secondary | ICD-10-CM | POA: Diagnosis not present

## 2017-03-13 ENCOUNTER — Other Ambulatory Visit: Payer: Self-pay | Admitting: Family Medicine

## 2017-03-13 DIAGNOSIS — G8929 Other chronic pain: Secondary | ICD-10-CM

## 2017-03-13 DIAGNOSIS — M545 Low back pain: Principal | ICD-10-CM

## 2017-03-19 DIAGNOSIS — R972 Elevated prostate specific antigen [PSA]: Secondary | ICD-10-CM | POA: Diagnosis not present

## 2017-03-19 DIAGNOSIS — R351 Nocturia: Secondary | ICD-10-CM | POA: Diagnosis not present

## 2017-03-19 DIAGNOSIS — R350 Frequency of micturition: Secondary | ICD-10-CM | POA: Diagnosis not present

## 2017-03-22 IMAGING — DX DG LUMBAR SPINE 2-3V
3 series · 3 of 3 positions shown · non-contrast
Comparison: Lumbar spine series of February 21, 2009 and MRI of the
lumbar spine February 14, 2014

CLINICAL DATA: Status post fall backwards 2 weeks ago with
persistent low back and tail bone pain; unable to lie on the left
side

EXAM:
LUMBAR SPINE - 2-3 VIEW

[l-spine ap]
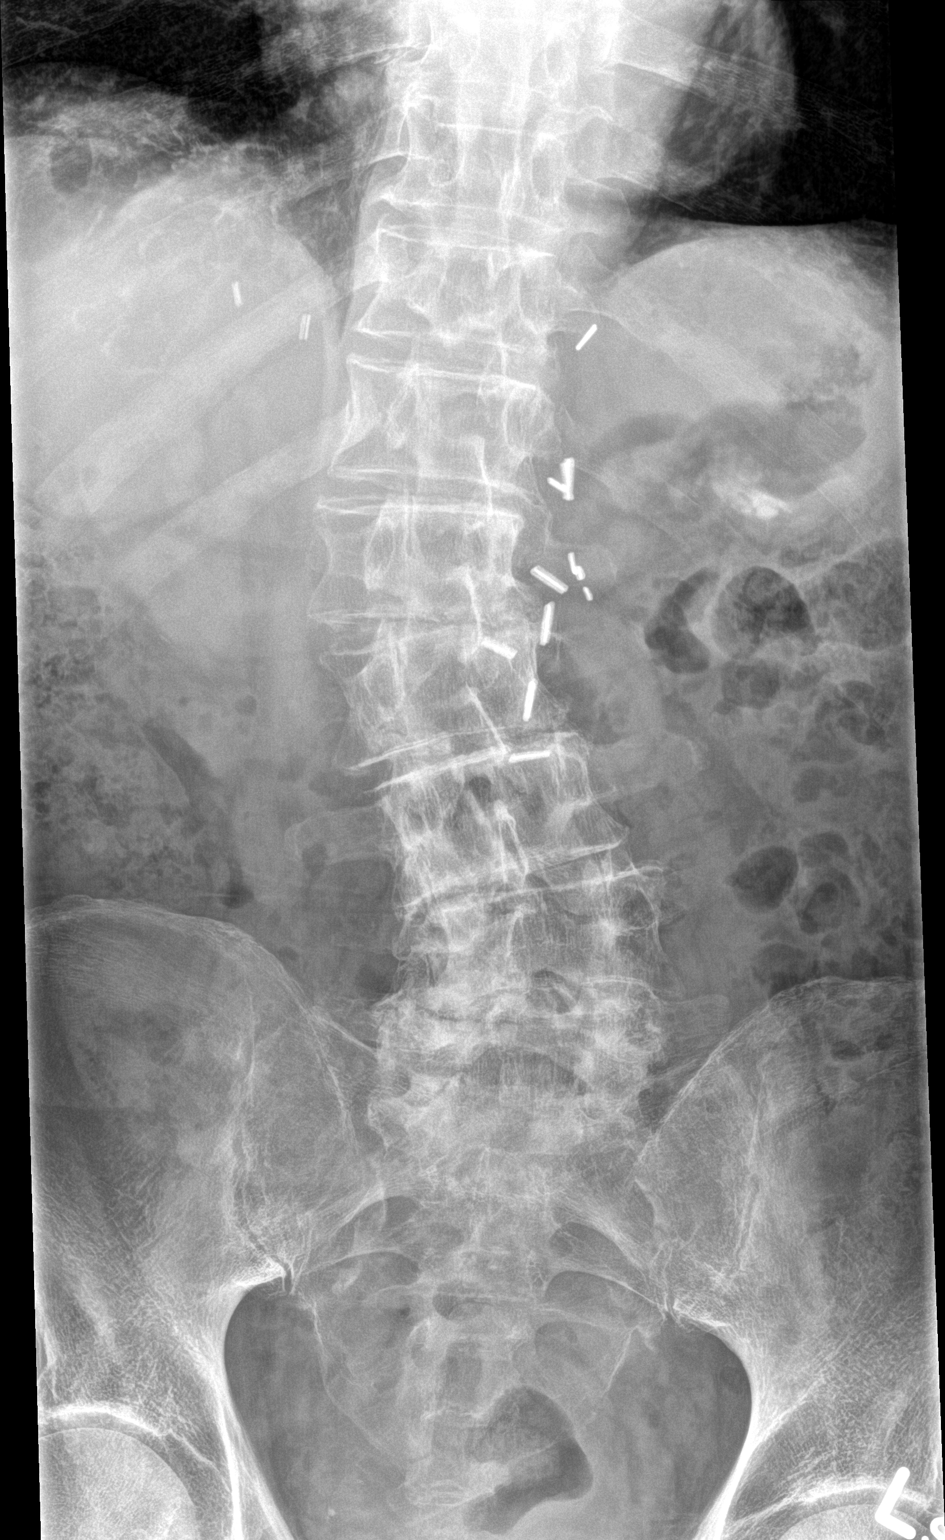

[l-spine lat]
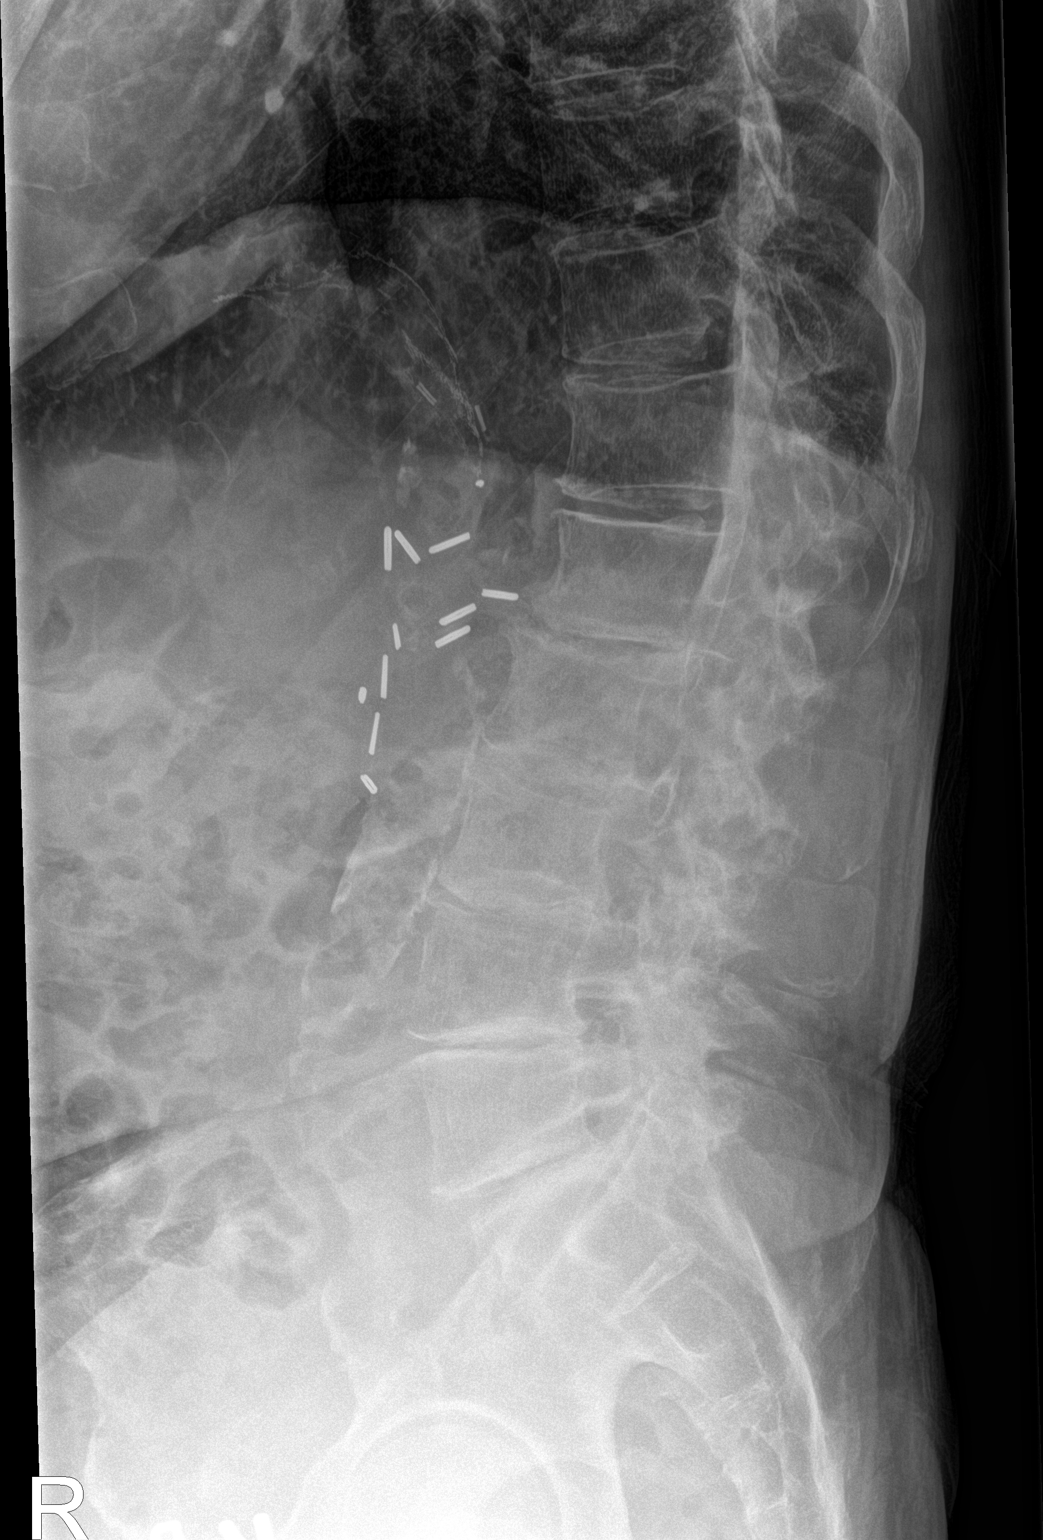

[l-spine spot]
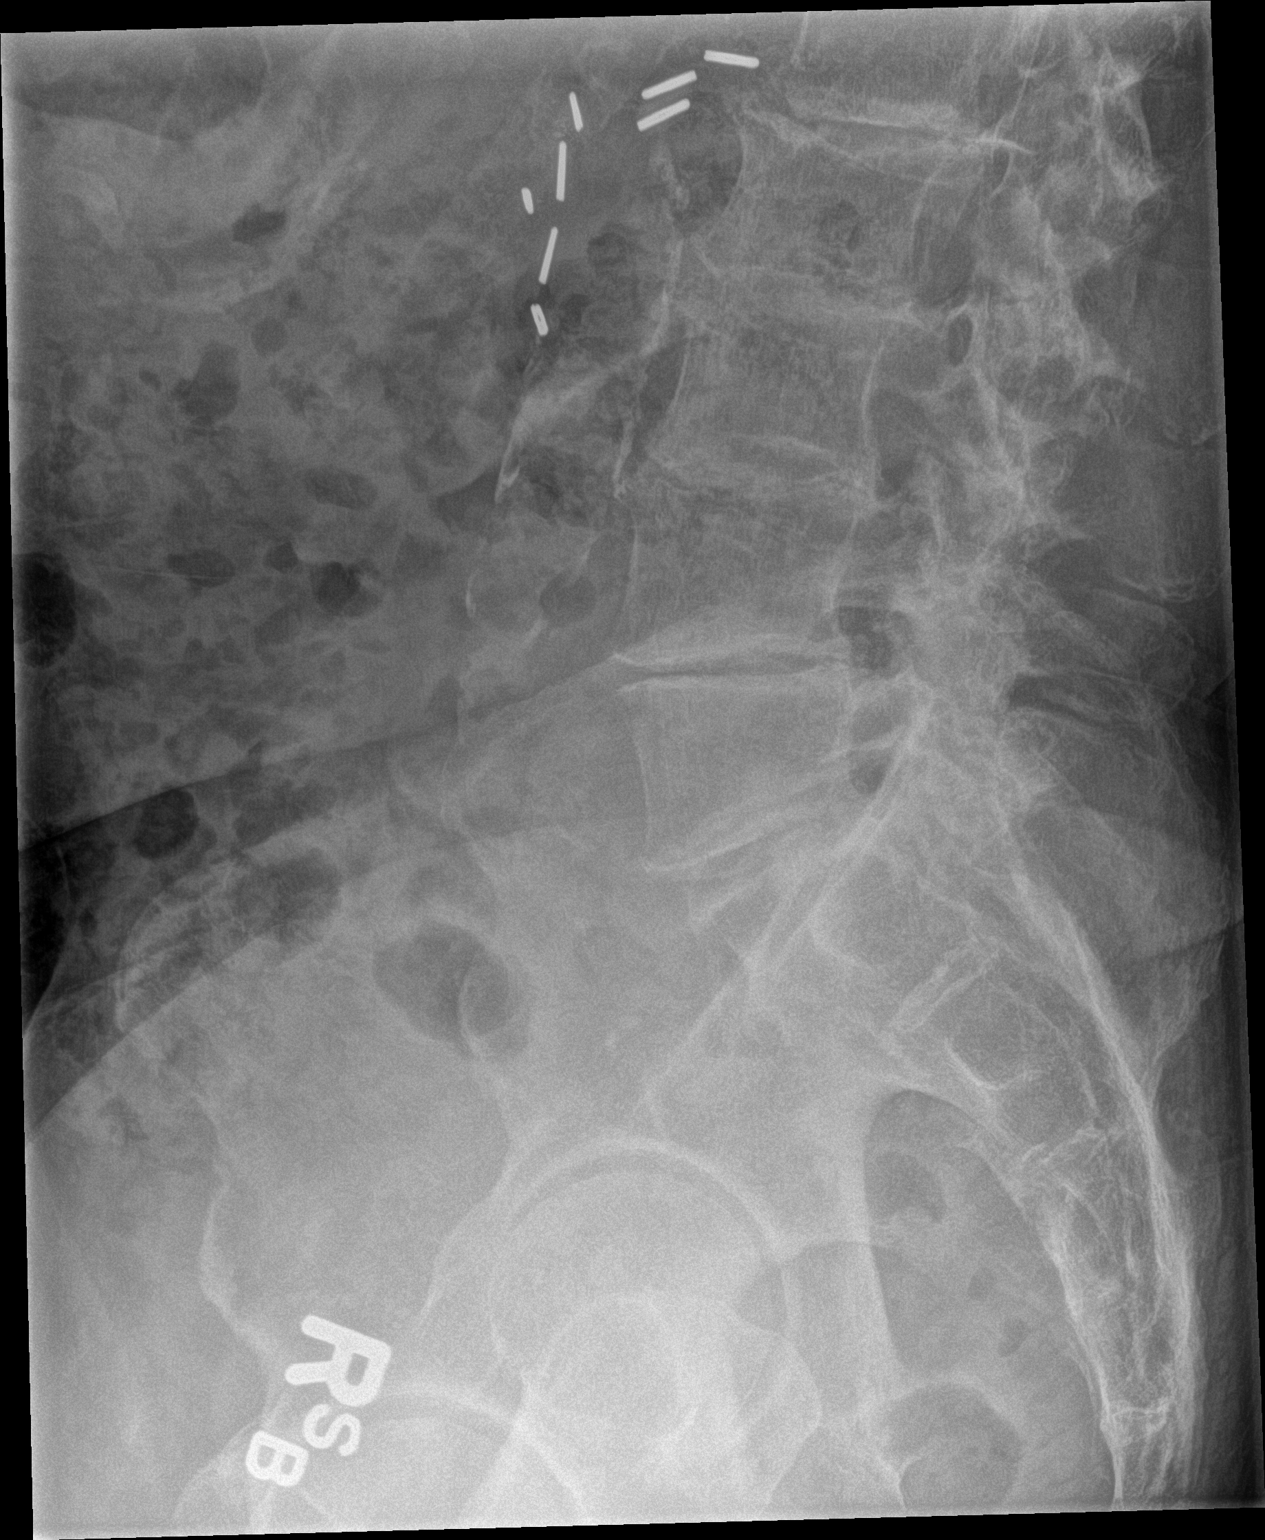

[3 of 3 positions shown; findings below may reference images not displayed]

FINDINGS: There is moderate dextrocurvature centered at L1. This is slightly
more conspicuous than on the 2585 study. The lumbar vertebral bodies
are preserved in height. There is multilevel moderate degenerative
disc space narrowing. There is chronic grade 1 anterolisthesis of L2
with respect L1. There is multilevel facet joint hypertrophy. The
pedicles and transverse processes are intact. The observed portions
of the sacrum are unremarkable. There is dense calcification in the
wall of the abdominal aorta. There are surgical clips in the left
paravertebral region of the lower thoracic and upper lumbar spine.
IMPRESSION: There is multilevel degenerative disc disease and facet joint
hypertrophy. There is no compression fracture. There is stable grade
1 anterolisthesis of L2 with respect L1. There is chronic
dextrocurvature centered at L1 which has increased since the
previous study.

## 2017-03-22 IMAGING — DX DG SACRUM/COCCYX 2+V
3 series · 3 of 3 positions shown · non-contrast
Comparison: None.

CLINICAL DATA: Fall 2 weeks ago.  Pain.

EXAM:
SACRUM AND COCCYX - 2+ VIEW

[sacrum ap]
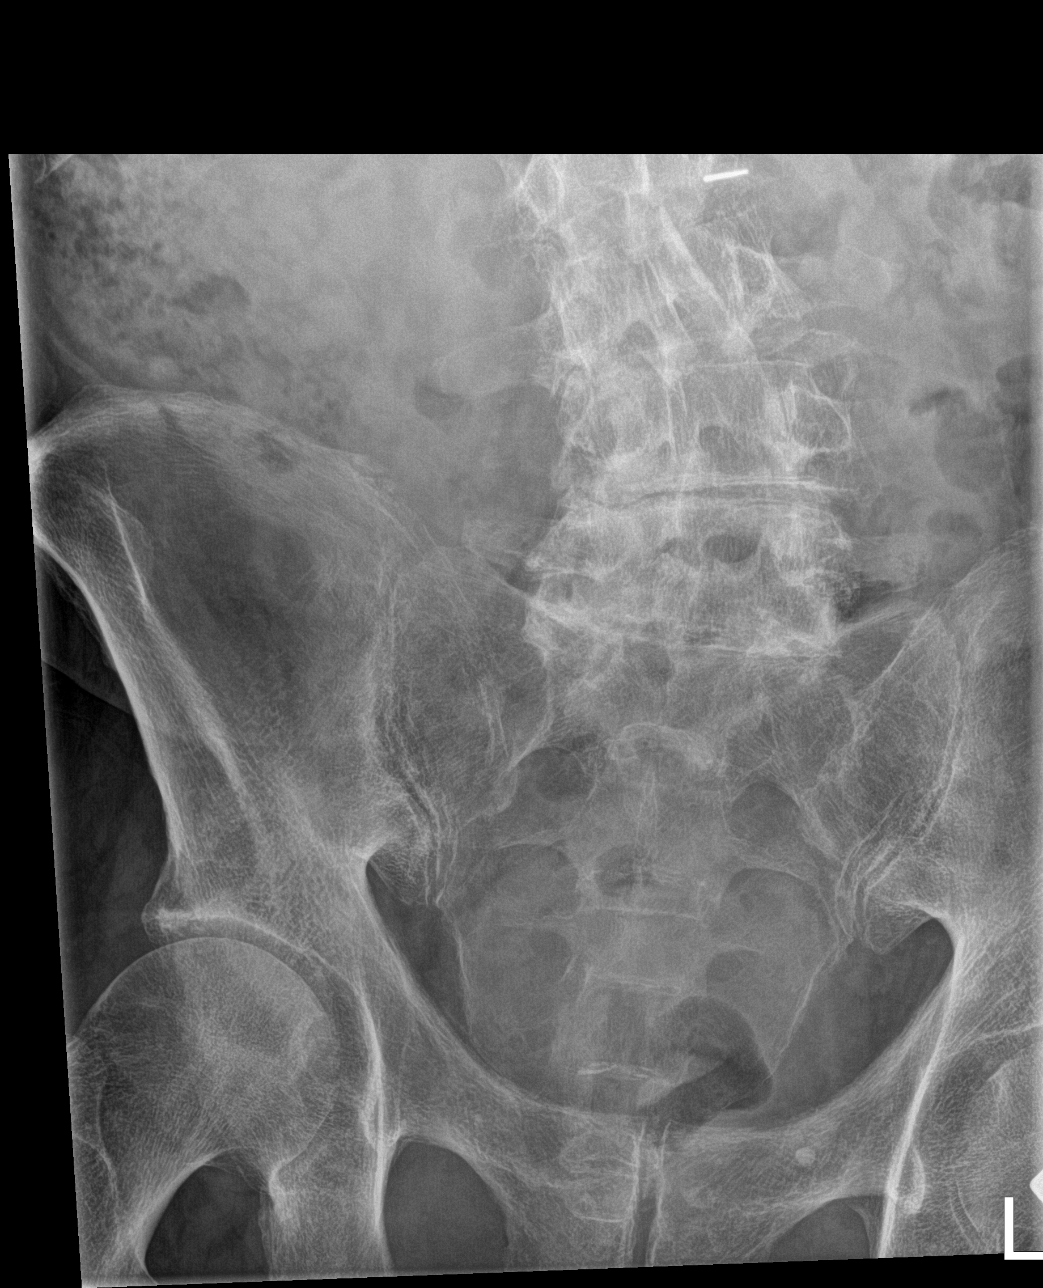

[coccyx ap]
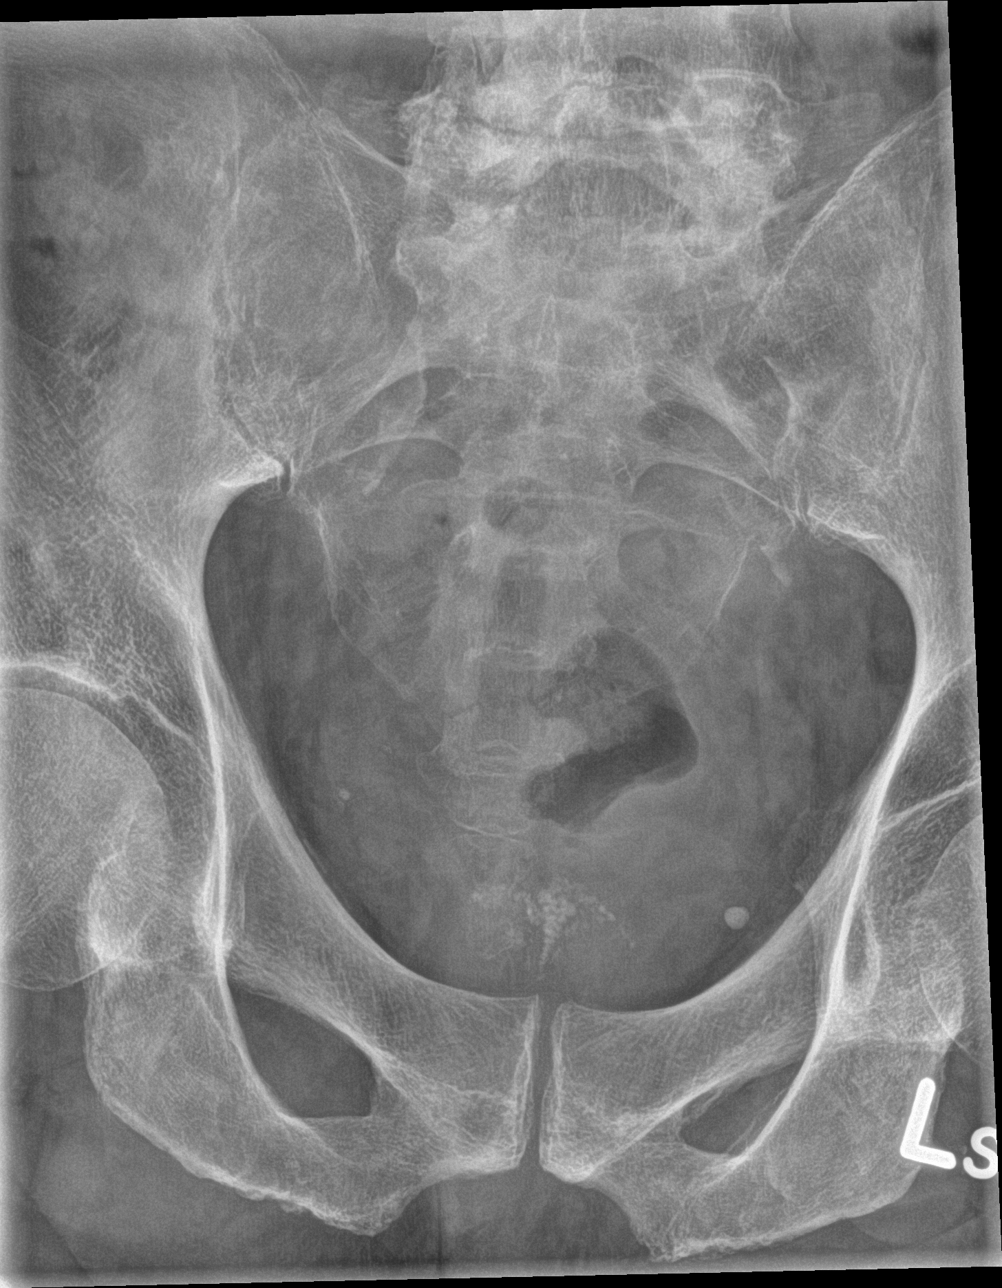

[coccyx lat]
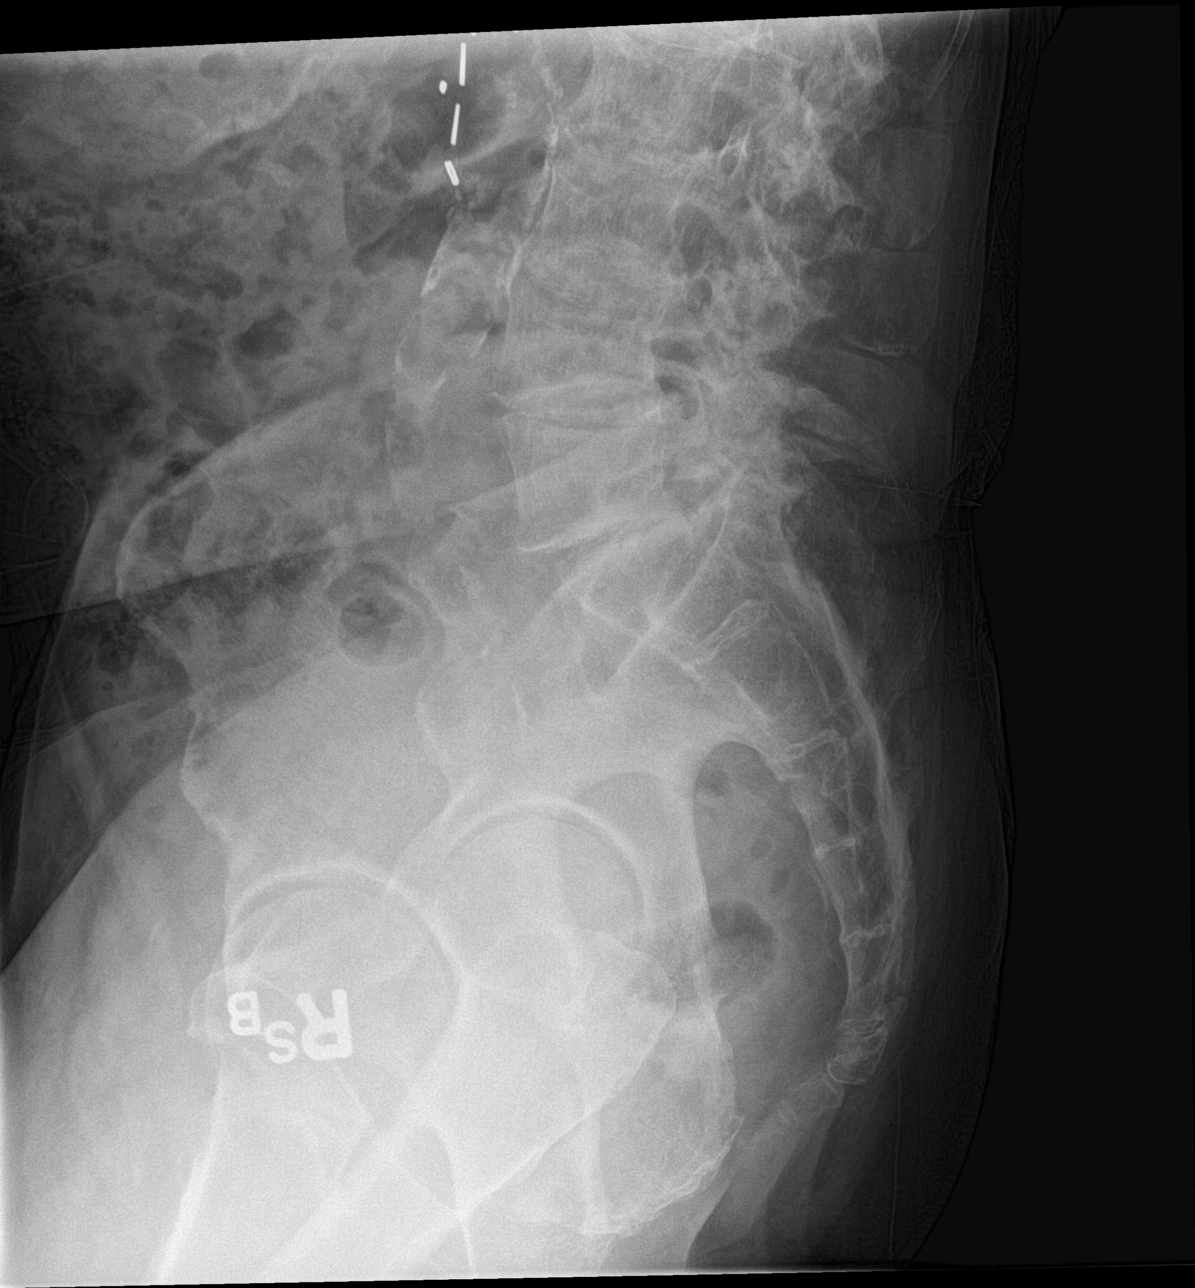

[3 of 3 positions shown; findings below may reference images not displayed]

FINDINGS: Lumbar spine scoliosis and degenerative change. No acute
abnormality. No evidence of fracture. Pelvic calcifications.
Aortoiliac atherosclerotic vascular disease . Surgical clips noted
in the lower abdomen..
IMPRESSION: 1. Degenerative changes and scoliosis lumbar spine. Diffuse
osteopenia. No acute abnormality identified.

2.  Aortoiliac atherosclerotic vascular disease.

## 2017-03-25 ENCOUNTER — Other Ambulatory Visit: Payer: Medicare Other

## 2017-03-25 ENCOUNTER — Ambulatory Visit
Admission: RE | Admit: 2017-03-25 | Discharge: 2017-03-25 | Disposition: A | Discharge status: Home / Self Care | Payer: Medicare Other | Source: Ambulatory Visit | Attending: Family Medicine | Admitting: Family Medicine

## 2017-03-25 ENCOUNTER — Other Ambulatory Visit: Payer: Self-pay | Admitting: Family Medicine

## 2017-03-25 DIAGNOSIS — M545 Low back pain: Principal | ICD-10-CM

## 2017-03-25 DIAGNOSIS — G8929 Other chronic pain: Secondary | ICD-10-CM

## 2017-03-25 MED ORDER — METHYLPREDNISOLONE ACETATE 40 MG/ML INJ SUSP (RADIOLOG
120.0000 mg | Freq: Once | INTRAMUSCULAR | Status: AC
  Administered 2017-03-25: 120 mg via INTRA_ARTICULAR

## 2017-04-16 DIAGNOSIS — Z23 Encounter for immunization: Secondary | ICD-10-CM | POA: Diagnosis not present

## 2017-05-06 DIAGNOSIS — L300 Nummular dermatitis: Secondary | ICD-10-CM | POA: Diagnosis not present

## 2017-07-17 DIAGNOSIS — N3281 Overactive bladder: Secondary | ICD-10-CM | POA: Diagnosis not present

## 2017-07-17 DIAGNOSIS — R413 Other amnesia: Secondary | ICD-10-CM | POA: Diagnosis not present

## 2017-07-17 DIAGNOSIS — N400 Enlarged prostate without lower urinary tract symptoms: Secondary | ICD-10-CM | POA: Diagnosis not present

## 2017-07-17 DIAGNOSIS — S22080D Wedge compression fracture of T11-T12 vertebra, subsequent encounter for fracture with routine healing: Secondary | ICD-10-CM | POA: Diagnosis not present

## 2017-07-17 DIAGNOSIS — E782 Mixed hyperlipidemia: Secondary | ICD-10-CM | POA: Diagnosis not present

## 2017-07-17 DIAGNOSIS — Z8505 Personal history of malignant neoplasm of liver: Secondary | ICD-10-CM | POA: Diagnosis not present

## 2017-07-17 DIAGNOSIS — B356 Tinea cruris: Secondary | ICD-10-CM | POA: Diagnosis not present

## 2017-07-17 DIAGNOSIS — R42 Dizziness and giddiness: Secondary | ICD-10-CM | POA: Diagnosis not present

## 2017-07-17 DIAGNOSIS — D692 Other nonthrombocytopenic purpura: Secondary | ICD-10-CM | POA: Diagnosis not present

## 2017-07-17 DIAGNOSIS — M545 Low back pain: Secondary | ICD-10-CM | POA: Diagnosis not present

## 2017-07-17 DIAGNOSIS — R7303 Prediabetes: Secondary | ICD-10-CM | POA: Diagnosis not present

## 2017-10-17 DIAGNOSIS — L304 Erythema intertrigo: Secondary | ICD-10-CM | POA: Diagnosis not present

## 2017-11-04 ENCOUNTER — Ambulatory Visit
Admission: RE | Admit: 2017-11-04 | Discharge: 2017-11-04 | Disposition: A | Discharge status: Home / Self Care | Payer: Medicare Other | Source: Ambulatory Visit | Attending: Family Medicine | Admitting: Family Medicine

## 2017-11-04 ENCOUNTER — Other Ambulatory Visit: Payer: Self-pay | Admitting: Family Medicine

## 2017-11-04 DIAGNOSIS — M7989 Other specified soft tissue disorders: Secondary | ICD-10-CM | POA: Diagnosis not present

## 2017-11-04 DIAGNOSIS — M79605 Pain in left leg: Secondary | ICD-10-CM

## 2017-11-11 DIAGNOSIS — M1712 Unilateral primary osteoarthritis, left knee: Secondary | ICD-10-CM | POA: Diagnosis not present

## 2017-11-25 DIAGNOSIS — Z961 Presence of intraocular lens: Secondary | ICD-10-CM | POA: Diagnosis not present

## 2018-01-22 DIAGNOSIS — E782 Mixed hyperlipidemia: Secondary | ICD-10-CM | POA: Diagnosis not present

## 2018-01-22 DIAGNOSIS — N3281 Overactive bladder: Secondary | ICD-10-CM | POA: Diagnosis not present

## 2018-01-22 DIAGNOSIS — N400 Enlarged prostate without lower urinary tract symptoms: Secondary | ICD-10-CM | POA: Diagnosis not present

## 2018-01-22 DIAGNOSIS — M545 Low back pain: Secondary | ICD-10-CM | POA: Diagnosis not present

## 2018-01-22 DIAGNOSIS — R413 Other amnesia: Secondary | ICD-10-CM | POA: Diagnosis not present

## 2018-01-22 DIAGNOSIS — Z91030 Bee allergy status: Secondary | ICD-10-CM | POA: Diagnosis not present

## 2018-01-22 DIAGNOSIS — Z8505 Personal history of malignant neoplasm of liver: Secondary | ICD-10-CM | POA: Diagnosis not present

## 2018-01-22 DIAGNOSIS — S22080D Wedge compression fracture of T11-T12 vertebra, subsequent encounter for fracture with routine healing: Secondary | ICD-10-CM | POA: Diagnosis not present

## 2018-01-22 DIAGNOSIS — R5381 Other malaise: Secondary | ICD-10-CM | POA: Diagnosis not present

## 2018-01-22 DIAGNOSIS — R7303 Prediabetes: Secondary | ICD-10-CM | POA: Diagnosis not present

## 2018-01-22 DIAGNOSIS — D692 Other nonthrombocytopenic purpura: Secondary | ICD-10-CM | POA: Diagnosis not present

## 2018-01-22 DIAGNOSIS — R42 Dizziness and giddiness: Secondary | ICD-10-CM | POA: Diagnosis not present

## 2018-01-28 DIAGNOSIS — N400 Enlarged prostate without lower urinary tract symptoms: Secondary | ICD-10-CM | POA: Diagnosis not present

## 2018-01-28 DIAGNOSIS — Z85528 Personal history of other malignant neoplasm of kidney: Secondary | ICD-10-CM | POA: Diagnosis not present

## 2018-01-28 DIAGNOSIS — W19XXXD Unspecified fall, subsequent encounter: Secondary | ICD-10-CM | POA: Diagnosis not present

## 2018-01-28 DIAGNOSIS — M545 Low back pain: Secondary | ICD-10-CM | POA: Diagnosis not present

## 2018-01-28 DIAGNOSIS — R42 Dizziness and giddiness: Secondary | ICD-10-CM | POA: Diagnosis not present

## 2018-01-28 DIAGNOSIS — Z905 Acquired absence of kidney: Secondary | ICD-10-CM | POA: Diagnosis not present

## 2018-01-28 DIAGNOSIS — R7303 Prediabetes: Secondary | ICD-10-CM | POA: Diagnosis not present

## 2018-01-28 DIAGNOSIS — R413 Other amnesia: Secondary | ICD-10-CM | POA: Diagnosis not present

## 2018-01-28 DIAGNOSIS — Z8505 Personal history of malignant neoplasm of liver: Secondary | ICD-10-CM | POA: Diagnosis not present

## 2018-02-03 DIAGNOSIS — W19XXXD Unspecified fall, subsequent encounter: Secondary | ICD-10-CM | POA: Diagnosis not present

## 2018-02-03 DIAGNOSIS — R7303 Prediabetes: Secondary | ICD-10-CM | POA: Diagnosis not present

## 2018-02-03 DIAGNOSIS — M545 Low back pain: Secondary | ICD-10-CM | POA: Diagnosis not present

## 2018-02-03 DIAGNOSIS — N400 Enlarged prostate without lower urinary tract symptoms: Secondary | ICD-10-CM | POA: Diagnosis not present

## 2018-02-03 DIAGNOSIS — R413 Other amnesia: Secondary | ICD-10-CM | POA: Diagnosis not present

## 2018-02-03 DIAGNOSIS — R42 Dizziness and giddiness: Secondary | ICD-10-CM | POA: Diagnosis not present

## 2018-02-05 DIAGNOSIS — W19XXXD Unspecified fall, subsequent encounter: Secondary | ICD-10-CM | POA: Diagnosis not present

## 2018-02-05 DIAGNOSIS — N400 Enlarged prostate without lower urinary tract symptoms: Secondary | ICD-10-CM | POA: Diagnosis not present

## 2018-02-05 DIAGNOSIS — M545 Low back pain: Secondary | ICD-10-CM | POA: Diagnosis not present

## 2018-02-05 DIAGNOSIS — R7303 Prediabetes: Secondary | ICD-10-CM | POA: Diagnosis not present

## 2018-02-05 DIAGNOSIS — R42 Dizziness and giddiness: Secondary | ICD-10-CM | POA: Diagnosis not present

## 2018-02-05 DIAGNOSIS — R413 Other amnesia: Secondary | ICD-10-CM | POA: Diagnosis not present

## 2018-02-11 DIAGNOSIS — N400 Enlarged prostate without lower urinary tract symptoms: Secondary | ICD-10-CM | POA: Diagnosis not present

## 2018-02-11 DIAGNOSIS — R413 Other amnesia: Secondary | ICD-10-CM | POA: Diagnosis not present

## 2018-02-11 DIAGNOSIS — W19XXXD Unspecified fall, subsequent encounter: Secondary | ICD-10-CM | POA: Diagnosis not present

## 2018-02-11 DIAGNOSIS — M545 Low back pain: Secondary | ICD-10-CM | POA: Diagnosis not present

## 2018-02-11 DIAGNOSIS — R42 Dizziness and giddiness: Secondary | ICD-10-CM | POA: Diagnosis not present

## 2018-02-11 DIAGNOSIS — R7303 Prediabetes: Secondary | ICD-10-CM | POA: Diagnosis not present

## 2018-02-13 DIAGNOSIS — R7303 Prediabetes: Secondary | ICD-10-CM | POA: Diagnosis not present

## 2018-02-13 DIAGNOSIS — N400 Enlarged prostate without lower urinary tract symptoms: Secondary | ICD-10-CM | POA: Diagnosis not present

## 2018-02-13 DIAGNOSIS — W19XXXD Unspecified fall, subsequent encounter: Secondary | ICD-10-CM | POA: Diagnosis not present

## 2018-02-13 DIAGNOSIS — R413 Other amnesia: Secondary | ICD-10-CM | POA: Diagnosis not present

## 2018-02-13 DIAGNOSIS — M545 Low back pain: Secondary | ICD-10-CM | POA: Diagnosis not present

## 2018-02-13 DIAGNOSIS — R42 Dizziness and giddiness: Secondary | ICD-10-CM | POA: Diagnosis not present

## 2018-02-16 DIAGNOSIS — M545 Low back pain: Secondary | ICD-10-CM | POA: Diagnosis not present

## 2018-02-16 DIAGNOSIS — R42 Dizziness and giddiness: Secondary | ICD-10-CM | POA: Diagnosis not present

## 2018-02-16 DIAGNOSIS — R7303 Prediabetes: Secondary | ICD-10-CM | POA: Diagnosis not present

## 2018-02-16 DIAGNOSIS — R413 Other amnesia: Secondary | ICD-10-CM | POA: Diagnosis not present

## 2018-02-16 DIAGNOSIS — N400 Enlarged prostate without lower urinary tract symptoms: Secondary | ICD-10-CM | POA: Diagnosis not present

## 2018-02-16 DIAGNOSIS — W19XXXD Unspecified fall, subsequent encounter: Secondary | ICD-10-CM | POA: Diagnosis not present

## 2018-02-19 DIAGNOSIS — M545 Low back pain: Secondary | ICD-10-CM | POA: Diagnosis not present

## 2018-02-19 DIAGNOSIS — W19XXXD Unspecified fall, subsequent encounter: Secondary | ICD-10-CM | POA: Diagnosis not present

## 2018-02-19 DIAGNOSIS — R7303 Prediabetes: Secondary | ICD-10-CM | POA: Diagnosis not present

## 2018-02-19 DIAGNOSIS — N400 Enlarged prostate without lower urinary tract symptoms: Secondary | ICD-10-CM | POA: Diagnosis not present

## 2018-02-19 DIAGNOSIS — R42 Dizziness and giddiness: Secondary | ICD-10-CM | POA: Diagnosis not present

## 2018-02-19 DIAGNOSIS — R413 Other amnesia: Secondary | ICD-10-CM | POA: Diagnosis not present

## 2018-02-24 DIAGNOSIS — M545 Low back pain: Secondary | ICD-10-CM | POA: Diagnosis not present

## 2018-02-24 DIAGNOSIS — W19XXXD Unspecified fall, subsequent encounter: Secondary | ICD-10-CM | POA: Diagnosis not present

## 2018-02-24 DIAGNOSIS — R413 Other amnesia: Secondary | ICD-10-CM | POA: Diagnosis not present

## 2018-02-24 DIAGNOSIS — N400 Enlarged prostate without lower urinary tract symptoms: Secondary | ICD-10-CM | POA: Diagnosis not present

## 2018-02-24 DIAGNOSIS — R7303 Prediabetes: Secondary | ICD-10-CM | POA: Diagnosis not present

## 2018-02-24 DIAGNOSIS — R42 Dizziness and giddiness: Secondary | ICD-10-CM | POA: Diagnosis not present

## 2018-02-26 DIAGNOSIS — R7303 Prediabetes: Secondary | ICD-10-CM | POA: Diagnosis not present

## 2018-02-26 DIAGNOSIS — M65342 Trigger finger, left ring finger: Secondary | ICD-10-CM | POA: Diagnosis not present

## 2018-02-26 DIAGNOSIS — N400 Enlarged prostate without lower urinary tract symptoms: Secondary | ICD-10-CM | POA: Diagnosis not present

## 2018-02-26 DIAGNOSIS — R413 Other amnesia: Secondary | ICD-10-CM | POA: Diagnosis not present

## 2018-02-26 DIAGNOSIS — W19XXXD Unspecified fall, subsequent encounter: Secondary | ICD-10-CM | POA: Diagnosis not present

## 2018-02-26 DIAGNOSIS — M545 Low back pain: Secondary | ICD-10-CM | POA: Diagnosis not present

## 2018-02-26 DIAGNOSIS — M19042 Primary osteoarthritis, left hand: Secondary | ICD-10-CM | POA: Diagnosis not present

## 2018-02-26 DIAGNOSIS — R42 Dizziness and giddiness: Secondary | ICD-10-CM | POA: Diagnosis not present

## 2018-03-03 DIAGNOSIS — W19XXXD Unspecified fall, subsequent encounter: Secondary | ICD-10-CM | POA: Diagnosis not present

## 2018-03-03 DIAGNOSIS — R413 Other amnesia: Secondary | ICD-10-CM | POA: Diagnosis not present

## 2018-03-03 DIAGNOSIS — N400 Enlarged prostate without lower urinary tract symptoms: Secondary | ICD-10-CM | POA: Diagnosis not present

## 2018-03-03 DIAGNOSIS — R42 Dizziness and giddiness: Secondary | ICD-10-CM | POA: Diagnosis not present

## 2018-03-03 DIAGNOSIS — M545 Low back pain: Secondary | ICD-10-CM | POA: Diagnosis not present

## 2018-03-03 DIAGNOSIS — R7303 Prediabetes: Secondary | ICD-10-CM | POA: Diagnosis not present

## 2018-04-02 DIAGNOSIS — Z23 Encounter for immunization: Secondary | ICD-10-CM | POA: Diagnosis not present

## 2018-06-01 DIAGNOSIS — H5712 Ocular pain, left eye: Secondary | ICD-10-CM | POA: Diagnosis not present

## 2018-06-02 ENCOUNTER — Other Ambulatory Visit (HOSPITAL_COMMUNITY): Payer: Self-pay | Admitting: Ophthalmology

## 2018-06-02 DIAGNOSIS — H547 Unspecified visual loss: Secondary | ICD-10-CM

## 2018-06-05 ENCOUNTER — Ambulatory Visit (HOSPITAL_COMMUNITY)
Admission: RE | Admit: 2018-06-05 | Discharge: 2018-06-05 | Disposition: A | Discharge status: Home / Self Care | Payer: Medicare Other | Source: Ambulatory Visit | Attending: Ophthalmology | Admitting: Ophthalmology

## 2018-06-05 DIAGNOSIS — I6523 Occlusion and stenosis of bilateral carotid arteries: Secondary | ICD-10-CM | POA: Diagnosis not present

## 2018-06-05 DIAGNOSIS — H547 Unspecified visual loss: Secondary | ICD-10-CM | POA: Diagnosis not present

## 2018-06-05 NOTE — Progress Notes (Signed)
Carotid artery duplex has been completed. 1-39% ICA stenosis bilaterally.  06/05/18 2:29 PM Corey Davila RVT

## 2018-07-03 DIAGNOSIS — Z23 Encounter for immunization: Secondary | ICD-10-CM | POA: Diagnosis not present

## 2018-10-07 DIAGNOSIS — L03031 Cellulitis of right toe: Secondary | ICD-10-CM | POA: Diagnosis not present

## 2018-10-07 DIAGNOSIS — L600 Ingrowing nail: Secondary | ICD-10-CM | POA: Diagnosis not present

## 2018-12-16 DIAGNOSIS — H903 Sensorineural hearing loss, bilateral: Secondary | ICD-10-CM | POA: Diagnosis not present

## 2018-12-16 DIAGNOSIS — Z961 Presence of intraocular lens: Secondary | ICD-10-CM | POA: Diagnosis not present

## 2018-12-16 DIAGNOSIS — H6122 Impacted cerumen, left ear: Secondary | ICD-10-CM | POA: Diagnosis not present

## 2018-12-16 DIAGNOSIS — H52203 Unspecified astigmatism, bilateral: Secondary | ICD-10-CM | POA: Diagnosis not present

## 2018-12-17 DIAGNOSIS — H903 Sensorineural hearing loss, bilateral: Secondary | ICD-10-CM | POA: Diagnosis not present

## 2019-02-12 DIAGNOSIS — Z23 Encounter for immunization: Secondary | ICD-10-CM | POA: Diagnosis not present

## 2019-04-13 DIAGNOSIS — Z23 Encounter for immunization: Secondary | ICD-10-CM | POA: Diagnosis not present

## 2019-07-19 DIAGNOSIS — R05 Cough: Secondary | ICD-10-CM | POA: Diagnosis not present

## 2019-07-24 DIAGNOSIS — J069 Acute upper respiratory infection, unspecified: Secondary | ICD-10-CM | POA: Diagnosis not present

## 2019-07-24 DIAGNOSIS — R350 Frequency of micturition: Secondary | ICD-10-CM | POA: Diagnosis not present

## 2019-07-26 ENCOUNTER — Encounter (HOSPITAL_BASED_OUTPATIENT_CLINIC_OR_DEPARTMENT_OTHER): Payer: Self-pay | Admitting: *Deleted

## 2019-07-26 ENCOUNTER — Other Ambulatory Visit: Payer: Self-pay

## 2019-07-26 ENCOUNTER — Emergency Department (HOSPITAL_BASED_OUTPATIENT_CLINIC_OR_DEPARTMENT_OTHER)
Admission: EM | Admit: 2019-07-26 | Discharge: 2019-07-26 | Disposition: A | Discharge status: Home / Self Care | Payer: Medicare HMO | Attending: Emergency Medicine | Admitting: Emergency Medicine

## 2019-07-26 ENCOUNTER — Emergency Department (HOSPITAL_BASED_OUTPATIENT_CLINIC_OR_DEPARTMENT_OTHER): Payer: Medicare HMO

## 2019-07-26 DIAGNOSIS — Z7982 Long term (current) use of aspirin: Secondary | ICD-10-CM | POA: Insufficient documentation

## 2019-07-26 DIAGNOSIS — E782 Mixed hyperlipidemia: Secondary | ICD-10-CM | POA: Insufficient documentation

## 2019-07-26 DIAGNOSIS — Z79899 Other long term (current) drug therapy: Secondary | ICD-10-CM | POA: Insufficient documentation

## 2019-07-26 DIAGNOSIS — J90 Pleural effusion, not elsewhere classified: Secondary | ICD-10-CM

## 2019-07-26 DIAGNOSIS — Z8505 Personal history of malignant neoplasm of liver: Secondary | ICD-10-CM | POA: Insufficient documentation

## 2019-07-26 DIAGNOSIS — Z880 Allergy status to penicillin: Secondary | ICD-10-CM | POA: Diagnosis not present

## 2019-07-26 DIAGNOSIS — R05 Cough: Secondary | ICD-10-CM | POA: Diagnosis not present

## 2019-07-26 DIAGNOSIS — R918 Other nonspecific abnormal finding of lung field: Secondary | ICD-10-CM | POA: Insufficient documentation

## 2019-07-26 DIAGNOSIS — R079 Chest pain, unspecified: Secondary | ICD-10-CM | POA: Diagnosis not present

## 2019-07-26 DIAGNOSIS — Z91030 Bee allergy status: Secondary | ICD-10-CM | POA: Insufficient documentation

## 2019-07-26 DIAGNOSIS — Z888 Allergy status to other drugs, medicaments and biological substances status: Secondary | ICD-10-CM | POA: Insufficient documentation

## 2019-07-26 DIAGNOSIS — Z885 Allergy status to narcotic agent status: Secondary | ICD-10-CM | POA: Insufficient documentation

## 2019-07-26 DIAGNOSIS — Z85528 Personal history of other malignant neoplasm of kidney: Secondary | ICD-10-CM | POA: Diagnosis not present

## 2019-07-26 DIAGNOSIS — R531 Weakness: Secondary | ICD-10-CM | POA: Diagnosis present

## 2019-07-26 LAB — CBC WITH DIFFERENTIAL/PLATELET
Abs Immature Granulocytes: 0.01 10*3/uL (ref 0.00–0.07)
Basophils Absolute: 0 10*3/uL (ref 0.0–0.1)
Basophils Relative: 1 %
Eosinophils Absolute: 0.1 10*3/uL (ref 0.0–0.5)
Eosinophils Relative: 3 %
HCT: 29.9 % — ABNORMAL LOW (ref 39.0–52.0)
Hemoglobin: 9.4 g/dL — ABNORMAL LOW (ref 13.0–17.0)
Immature Granulocytes: 0 %
Lymphocytes Relative: 22 %
Lymphs Abs: 1 10*3/uL (ref 0.7–4.0)
MCH: 30.5 pg (ref 26.0–34.0)
MCHC: 31.4 g/dL (ref 30.0–36.0)
MCV: 97.1 fL (ref 80.0–100.0)
Monocytes Absolute: 0.8 10*3/uL (ref 0.1–1.0)
Monocytes Relative: 17 %
Neutro Abs: 2.6 10*3/uL (ref 1.7–7.7)
Neutrophils Relative %: 57 %
Platelets: 406 10*3/uL — ABNORMAL HIGH (ref 150–400)
RBC: 3.08 MIL/uL — ABNORMAL LOW (ref 4.22–5.81)
RDW: 13 % (ref 11.5–15.5)
WBC: 4.6 10*3/uL (ref 4.0–10.5)
nRBC: 0 % (ref 0.0–0.2)

## 2019-07-26 LAB — URINALYSIS, MICROSCOPIC (REFLEX)

## 2019-07-26 LAB — BASIC METABOLIC PANEL
Anion gap: 8 (ref 5–15)
BUN: 15 mg/dL (ref 8–23)
CO2: 22 mmol/L (ref 22–32)
Calcium: 8.3 mg/dL — ABNORMAL LOW (ref 8.9–10.3)
Chloride: 104 mmol/L (ref 98–111)
Creatinine, Ser: 1.04 mg/dL (ref 0.61–1.24)
GFR calc Af Amer: >60 mL/min (ref >60)
GFR calc non Af Amer: >60 mL/min (ref >60)
Glucose, Bld: 126 mg/dL — ABNORMAL HIGH (ref 70–99)
Potassium: 3.9 mmol/L (ref 3.5–5.1)
Sodium: 134 mmol/L — ABNORMAL LOW (ref 135–145)

## 2019-07-26 LAB — HEPATIC FUNCTION PANEL
ALT: 15 U/L (ref 0–44)
AST: 18 U/L (ref 15–41)
Albumin: 2.1 g/dL — ABNORMAL LOW (ref 3.5–5.0)
Alkaline Phosphatase: 101 U/L (ref 38–126)
Bilirubin, Direct: 0.1 mg/dL (ref 0.0–0.2)
Indirect Bilirubin: 0.4 mg/dL (ref 0.3–0.9)
Total Bilirubin: 0.5 mg/dL (ref 0.3–1.2)
Total Protein: 5.7 g/dL — ABNORMAL LOW (ref 6.5–8.1)

## 2019-07-26 LAB — URINALYSIS, ROUTINE W REFLEX MICROSCOPIC
Bilirubin Urine: NEGATIVE
Glucose, UA: NEGATIVE mg/dL
Hgb urine dipstick: NEGATIVE
Ketones, ur: NEGATIVE mg/dL
Nitrite: NEGATIVE
Protein, ur: 100 mg/dL — AB
Specific Gravity, Urine: 1.025 (ref 1.005–1.030)
pH: 6 (ref 5.0–8.0)

## 2019-07-26 LAB — LIPASE, BLOOD: Lipase: 26 U/L (ref 11–51)

## 2019-07-26 MED ORDER — SODIUM CHLORIDE 0.9 % IV SOLN: INTRAVENOUS | Status: DC

## 2019-07-26 MED ORDER — IOHEXOL 300 MG/ML  SOLN
80.0000 mL | Freq: Once | INTRAMUSCULAR | Status: AC | PRN
  Administered 2019-07-26: 18:00:00 80 mL via INTRAVENOUS

## 2019-07-26 NOTE — ED Notes (Signed)
Patient transported to CT 

## 2019-07-26 NOTE — ED Provider Notes (Signed)
[facility] EMERGENCY DEPARTMENT Provider Note   CSN: [id number] Arrival date & time: 07/26/19  1428     History Chief Complaint  Patient presents with  . Weakness    Corey Davila is a 86 y.o. male.  Patient brought in by family member.  Patient with weakness some abdominal soreness from coughing also a little bit of shortness of breath.  But main complaint is the weakness in the cough.  Appetites been good.  Patient's been sick since Christmas eve.  He was placed on Cipro today is already taken 1 dose for presumed a urinary tract infection because he had a lot of urinary frequency.  Oxygen saturations 93% it was reported that good today earlier at home and dropped down to 88%.  Denies any fevers.  Past medical history is significant for kidney cancer in 1980 low left nephrectomy.  And liver cancer with a partial liver resection and 2008 and then a repeat partial liver resection and 2012.        Past Medical History:  Diagnosis Date  . Arthritis    "hands, back" (02/13/2015)  . Cancer of kidney ([place]) 06/19/1979   L renal taken out   . Chronic lower back pain    "since 02/2014" (02/13/2015)  . Hearing loss of both ears   . Liver cancer ([place])    partial liver resection 06/18/2007; partial liver resection 08/29/2010, not renal  . Mixed hyperlipidemia   . Pneumonia ~ 2012  . Sleep apnea    "doesn't wear mask" (02/13/2015)    Patient Active Problem List   Diagnosis Date Noted  . Compression fracture of thoracic vertebra (HCC)   . Lumbar and sacral arthritis 02/23/2015  . Degeneration of intervertebral disc of lumbar region 02/23/2015  . Hyponatremia   . Fall   . Acute hyponatremia 02/13/2015  . UTI (lower urinary tract infection) 02/13/2015  . Traumatic compression fracture of T11 thoracic vertebra (HCC) 02/13/2015  . Rib fractures 10/08/2014  . Chest pain 10/08/2014  . Aspiration pneumonia ([place]) 12/03/2012  . Mixed hyperlipidemia   . Cough   . Malignant neoplasm  of liver, primary ([place]) 10/18/2011  . History of kidney cancer 06/19/1979    Past Surgical History:  Procedure Laterality Date  . CARPAL TUNNEL RELEASE Left 09/10/2013   Procedure: LEFT CARPAL TUNNEL RELEASE;  Surgeon: Cammie Sickle., MD;  Location: [facility];  Service: Orthopedics;  Laterality: Left;  . COLONOSCOPY    . NEPHRECTOMY Left 1980   [place] -cancer  . OPEN PARTIAL HEPATECTOMY   06/18/07   "75%", [facility]  R lobe-cancer  . OPEN PARTIAL HEPATECTOMY   08/29/2010   "20%", [facility]  L lobe       Family History  Problem Relation Age of Onset  . Stroke Father     Social History   Tobacco Use  . Smoking status: Never Smoker  . Smokeless tobacco: Never Used  Substance Use Topics  . Alcohol use: No  . Drug use: No    Home Medications Prior to Admission medications   Medication Sig Start Date End Date Taking? Authorizing Provider  aspirin 81 MG tablet Take 81 mg by mouth daily.    [provider]  Calcium Citrate 200 MG TABS Take 1 tablet by mouth daily. 08/21/10   [provider]  Calcium-Vit D3-Phytosterols [id number] MG-UNIT-MG TABS Take 1 tablet by mouth daily. 04/21/07   [provider]  CRESTOR 20 MG tablet Take 1  tablet by mouth every other day.    [provider]  docusate sodium (COLACE) 100 MG capsule Take 100 mg by mouth daily. 08/21/10   [provider]  finasteride (PROSCAR) 5 MG tablet Take 1 tablet by mouth daily.    [provider]  Glucosamine-Chondroitin 250-200 MG TABS Take 1 tablet by mouth daily. Regular strength tabs. 04/21/07   [provider]  ibuprofen (ADVIL,MOTRIN) 200 MG tablet Take 200 mg by mouth every 6 (six) hours as needed.    [provider]  meclizine (ANTIVERT) 12.5 MG tablet Take by mouth 3 (three) times daily as needed for dizziness.  12/01/14   [provider]  Multiple Vitamin (MULTIVITAMIN WITH MINERALS) TABS tablet Take 1 tablet  by mouth daily.    [provider]  traMADol Veatrice Bourbon) 50 MG tablet  01/24/15   [provider]  vitamin B-12 (CYANOCOBALAMIN) 250 MCG tablet Take 250 mcg by mouth daily.    [provider]    Allergies    Bee venom, Hornet venom, Tessalon [benzonatate], Tussicaps [hydrocod polst-cpm polst er], Amoxicillin, Dilantin [phenytoin], Hydrocodone-chlorpheniramine, Mevacor [lovastatin], Prednisone, Septra ds [sulfamethoxazole-trimethoprim], and Niacin and related  Review of Systems   Review of Systems  Constitutional: Positive for fatigue. Negative for chills and fever.  HENT: Negative for rhinorrhea and sore throat.   Eyes: Negative for visual disturbance.  Respiratory: Positive for cough and shortness of breath.   Cardiovascular: Negative for chest pain and leg swelling.  Gastrointestinal: Negative for abdominal pain, diarrhea, nausea and vomiting.  Genitourinary: Negative for dysuria.  Musculoskeletal: Negative for back pain and neck pain.  Skin: Negative for rash.  Neurological: Positive for weakness. Negative for dizziness, light-headedness and headaches.  Hematological: Does not bruise/bleed easily.  Psychiatric/Behavioral: Negative for confusion.    Physical Exam Updated Vital Signs BP (!) 131/92 (BP Location: Right Arm)   Pulse 92   Temp 99.5 F (37.5 C) (Oral)   Resp 18   Ht 1.702 m (5\' 7" )   Wt 65.3 kg   SpO2 98%   BMI 22.55 kg/m   Physical Exam Vitals and nursing note reviewed.  Constitutional:      Appearance: Normal appearance. He is well-developed.  HENT:     Head: Normocephalic and atraumatic.  Eyes:     Extraocular Movements: Extraocular movements intact.     Conjunctiva/sclera: Conjunctivae normal.     Pupils: Pupils are equal, round, and reactive to light.  Cardiovascular:     Rate and Rhythm: Normal rate and regular rhythm.     Heart sounds: No murmur.  Pulmonary:     Effort: Pulmonary effort is normal. No respiratory distress.      Comments: Please breath sounds on the left. Abdominal:     Palpations: Abdomen is soft.     Tenderness: There is no abdominal tenderness.  Musculoskeletal:        General: No swelling. Normal range of motion.     Cervical back: Normal range of motion and neck supple.  Skin:    General: Skin is warm and dry.  Neurological:     General: No focal deficit present.     Mental Status: He is alert. Mental status is at baseline.     ED Results / Procedures / Treatments   Labs (all labs ordered are listed, but only abnormal results are displayed) Labs Reviewed  URINALYSIS, ROUTINE W REFLEX MICROSCOPIC - Abnormal; Notable for the following components:      Result Value  APPearance CLOUDY (*)    Protein, ur 100 (*)    Leukocytes,Ua SMALL (*)    All other components within normal limits  CBC WITH DIFFERENTIAL/PLATELET - Abnormal; Notable for the following components:   RBC 3.08 (*)    Hemoglobin 9.4 (*)    HCT 29.9 (*)    Platelets 406 (*)    All other components within normal limits  BASIC METABOLIC PANEL - Abnormal; Notable for the following components:   Sodium 134 (*)    Glucose, Bld 126 (*)    Calcium 8.3 (*)    All other components within normal limits  URINALYSIS, MICROSCOPIC (REFLEX) - Abnormal; Notable for the following components:   Bacteria, UA FEW (*)    All other components within normal limits  HEPATIC FUNCTION PANEL - Abnormal; Notable for the following components:   Total Protein 5.7 (*)    Albumin 2.1 (*)    All other components within normal limits  URINE CULTURE  LIPASE, BLOOD    EKG None  Radiology CT Chest W Contrast  Result Date: 07/26/2019 CLINICAL DATA:  Persistent cough. History of liver and kidney cancer, status post left nephrectomy. EXAM: CT CHEST WITH CONTRAST TECHNIQUE: Multidetector CT imaging of the chest was performed during intravenous contrast administration. CONTRAST:  2mL OMNIPAQUE IOHEXOL 300 MG/ML  SOLN COMPARISON:  12/03/2016  FINDINGS: Cardiovascular: Heart is normal size. Coronary artery and aortic calcifications. Tortuous aorta. No aneurysm. Mediastinum/Nodes: Abnormal soft tissue in the prevascular region, likely adenopathy with a short axis diameter of 16 mm. No axillary or hilar adenopathy. Trachea and esophagus are unremarkable. Thyroid unremarkable. Lungs/Pleura: Large left pleural effusion. There is a pleural base mass seen in the anteromedial left upper lobe/lingula with invasion into the mediastinum. This measures 4 x 2.5 cm. Numerous bilateral pulmonary nodules elsewhere throughout both lungs. Index right middle lobe lesion measures 11 mm on image 83. Numerous other similarly sized and smaller pulmonary nodules bilaterally. Upper Abdomen: Prior left nephrectomy and partial right hepatectomy. No acute findings. Musculoskeletal: Chest wall soft tissues are unremarkable. No acute bony abnormality. Prior vertebroplasty at T11. Lucent area in the T12 vertebral body could reflect lytic metastasis. IMPRESSION: Large left pleural effusion. Invasive left upper lobe medial pleural base mass invading into the anterior mediastinum. Numerous bilateral pulmonary nodules, 11 mm or less in size. Above findings concerning for metastases. Prevascular adenopathy. Coronary artery disease. Lucent lesion within the T12 vertebral body could reflect lytic bone metastasis. Aortic Atherosclerosis (ICD10-I70.0). Electronically Signed   By: Rolm Baptise M.D.   On: 07/26/2019 18:08   DG Chest Port 1 View  Result Date: 07/26/2019 CLINICAL DATA:  Cough.  Weakness. EXAM: PORTABLE CHEST 1 VIEW COMPARISON:  02/13/2015 FINDINGS: There is a moderate-sized left-sided pleural effusion. There is a dense retrocardiac opacity. There is a small right-sided pleural effusion. There are airspace opacities at the lung bases favored to represent atelectasis. There is no pneumothorax. There are old healed left-sided rib fractures. Aortic calcifications are noted. There  is an atypical appearance of the aortic arch with 2 areas of crescentic calcifications. IMPRESSION: 1. Moderate left-sided pleural effusion. 2. Dense retrocardiac opacity which may represent atelectasis or infiltrate. 3. Probable small right-sided pleural effusion with adjacent atelectasis. 4. Abnormal appearance of the aortic arch. Findings are favored to be secondary to artifact in the absence of chest pain. Alternatively, this appearance can be seen in patients with an aortic dissection. Consider further evaluation with a contrast enhanced CT of the chest as this could evaluate  all of the above findings in greater detail. Electronically Signed   By: Constance Holster M.D.   On: 07/26/2019 16:19    Procedures Procedures (including critical care time)  Medications Ordered in ED Medications  0.9 %  sodium chloride infusion ( Intravenous Stopped 07/26/19 1950)  iohexol (OMNIPAQUE) 300 MG/ML solution 80 mL (80 mLs Intravenous Contrast Given 07/26/19 1759)    ED Course  I have reviewed the triage vital signs and the nursing notes.  Pertinent labs & imaging results that were available during my care of the patient were reviewed by me and considered in my medical decision making (see chart for details).    MDM Rules/Calculators/A&P                      Patient very pleasant.  Chest x-ray raise concerns for pleural effusion on the left CT with contrast was done which shows a mass in the upper part of the lung as well as a large pleural effusion.  Also some adenopathy.  And a questionable T12 lytic lesion.  Patient ambulated oxygen saturation stayed 95% patient was not particularly winded.  Spoke with on-call hematology oncology Dr. Lindi Adie he will contact the patient's daughter for follow-up.  Epic message sent to him.  This may very well be metastatic or recurrence related to either his liver cancer with the last resection being 2012.  Or could be a new primary.  Patient probably will feel better  with drainage of the pleural fluid.  And also may be diagnostic.  Right now family certainly does not want full treatment plan does not want any more surgery.  But they would like to discuss their options.      Final Clinical Impression(s) / ED Diagnoses Final diagnoses:  Pleural effusion on left  Mass of left lung    Rx / DC Orders ED Discharge Orders    None       Fredia Sorrow, MD 07/26/19 2027

## 2019-07-26 NOTE — ED Notes (Signed)
Patient verbalizes understanding of discharge instructions. Opportunity for questioning and answers were provided. Armband removed by staff, pt discharged from ED.  

## 2019-07-26 NOTE — ED Triage Notes (Signed)
Weakness, abdominal pain and cough x 2 weeks. He requires assistance with ambulation due to recent weakness. He is alert and oriented. Good appetite.

## 2019-07-26 NOTE — ED Notes (Signed)
PT ambulated slightly unsteady with a walker but no assistance. PT ambulated successfully. PT's O2 remained above 94% Through ambulation

## 2019-07-26 NOTE — Discharge Instructions (Addendum)
Hematology oncology from a [facility] will be current contacting you.  But the doctors information is provided above if you do not hear from them.  Your primary contacted tomorrow.  Continue current medications and including the Cipro.

## 2019-07-26 NOTE — ED Notes (Signed)
ED Provider at bedside. 

## 2019-07-26 NOTE — ED Notes (Signed)
Urine sample and culture sent to lab with blood work.

## 2019-07-27 ENCOUNTER — Other Ambulatory Visit: Payer: Self-pay | Admitting: *Deleted

## 2019-07-27 ENCOUNTER — Inpatient Hospital Stay: Payer: Medicare HMO | Attending: Hematology and Oncology | Admitting: Hematology and Oncology

## 2019-07-27 ENCOUNTER — Other Ambulatory Visit: Payer: Self-pay

## 2019-07-27 ENCOUNTER — Other Ambulatory Visit (HOSPITAL_COMMUNITY)
Admission: RE | Admit: 2019-07-27 | Discharge: 2019-07-27 | Disposition: A | Discharge status: Home / Self Care | Payer: Medicare HMO | Source: Ambulatory Visit | Attending: Hematology and Oncology | Admitting: Hematology and Oncology

## 2019-07-27 DIAGNOSIS — R918 Other nonspecific abnormal finding of lung field: Secondary | ICD-10-CM

## 2019-07-27 DIAGNOSIS — M899 Disorder of bone, unspecified: Secondary | ICD-10-CM | POA: Insufficient documentation

## 2019-07-27 DIAGNOSIS — Z7982 Long term (current) use of aspirin: Secondary | ICD-10-CM | POA: Diagnosis not present

## 2019-07-27 DIAGNOSIS — Z20822 Contact with and (suspected) exposure to covid-19: Secondary | ICD-10-CM | POA: Insufficient documentation

## 2019-07-27 DIAGNOSIS — Z8505 Personal history of malignant neoplasm of liver: Secondary | ICD-10-CM | POA: Insufficient documentation

## 2019-07-27 DIAGNOSIS — J90 Pleural effusion, not elsewhere classified: Secondary | ICD-10-CM | POA: Diagnosis not present

## 2019-07-27 DIAGNOSIS — R531 Weakness: Secondary | ICD-10-CM | POA: Diagnosis not present

## 2019-07-27 DIAGNOSIS — Z905 Acquired absence of kidney: Secondary | ICD-10-CM | POA: Insufficient documentation

## 2019-07-27 DIAGNOSIS — Z01812 Encounter for preprocedural laboratory examination: Secondary | ICD-10-CM | POA: Insufficient documentation

## 2019-07-27 DIAGNOSIS — M545 Low back pain: Secondary | ICD-10-CM | POA: Diagnosis not present

## 2019-07-27 DIAGNOSIS — M129 Arthropathy, unspecified: Secondary | ICD-10-CM | POA: Insufficient documentation

## 2019-07-27 DIAGNOSIS — Z85528 Personal history of other malignant neoplasm of kidney: Secondary | ICD-10-CM | POA: Diagnosis not present

## 2019-07-27 DIAGNOSIS — J910 Malignant pleural effusion: Secondary | ICD-10-CM | POA: Insufficient documentation

## 2019-07-27 DIAGNOSIS — E785 Hyperlipidemia, unspecified: Secondary | ICD-10-CM | POA: Diagnosis not present

## 2019-07-27 DIAGNOSIS — Z79899 Other long term (current) drug therapy: Secondary | ICD-10-CM | POA: Diagnosis not present

## 2019-07-27 DIAGNOSIS — G4730 Sleep apnea, unspecified: Secondary | ICD-10-CM | POA: Insufficient documentation

## 2019-07-27 LAB — SARS CORONAVIRUS 2 (TAT 6-24 HRS): SARS Coronavirus 2: NEGATIVE

## 2019-07-27 NOTE — Progress Notes (Signed)
Per MD referral for hospice placed and called in to [place], RN with [facility].

## 2019-07-27 NOTE — Assessment & Plan Note (Signed)
CT chest: Left upper lobe lung mass extending into the anterior mediastinum with a large left pleural effusion and a T12 lytic bone lesion highly suspicious for malignancy. Prior history of hepatocellular carcinoma status post partial hepatectomy surgeries x2  I strongly suspect that the patient may have primary lung cancer. Plan: 1.  Goal of treatment: Palliation patient does not want to do any aggressive measures but he wants to be kept comfortable. 2.  Thoracentesis is being planned for tomorrow.  The fluid will be sent for cytology and cultures as well as glucose protein and albumin and LDH. 3.  Hospice will be consulted to assist with his care at home. I anticipate that he would have less than 6 months of median survival. 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  We will consult [facility] [place]. I will call the patient next Monday to go over the pathology results.

## 2019-07-27 NOTE — Progress Notes (Signed)
Per MD pt to be scheduled for STAT thoracentesis.  Orders placed and central scheduling notified to schedule pt for STAT covid test and thoracentesis.

## 2019-07-27 NOTE — Progress Notes (Signed)
[facility] CONSULT NOTE  Patient Care Team: Antony Contras, MD as PCP - General (Family Medicine) Elsie Stain, MD as Attending Physician (Pulmonary Disease)  CHIEF COMPLAINTS/PURPOSE OF CONSULTATION:  Newly diagnosed malignant pleural effusion  HISTORY OF PRESENTING ILLNESS:  Corey Davila 86 y.o. male is here because of recent emergency room visit with malignant appearing pleural effusion as well as a left lung mass that was invading the mediastinum.  He was experiencing multiple problems over the past several weeks and he was treated with multiple course of antibiotics.  His symptoms were cough and shortness of breath minimal exertion.  He has had frequent falls as well.  He has also been dragging his left foot intermittently. He went to the emergency room at [facility] who called me after CT chest showed very large left pleural effusion with the left upper lobe mass extending into the anterior mediastinum and a T12 vertebral body lesion.  He is here today accompanied by his daughter.  Sonia Side is his neighbor who has medical experience having retired from [employer] as well as hospice of [employer].  He was available on the phone to ask any questions.  He has a prior history of hepatocellular carcinoma that was treated with 2 surgeries with partial hepatectomy's. 1980 he had renal cell carcinoma and had a nephrectomy on the left.   I reviewed her records extensively and collaborated the history with the patient.  SUMMARY OF ONCOLOGIC HISTORY: Oncology History   No history exists.     MEDICAL HISTORY:  Past Medical History:  Diagnosis Date  . Arthritis    "hands, back" (02/13/2015)  . Cancer of kidney ([place]) 06/19/1979   L renal taken out   . Chronic lower back pain    "since 02/2014" (02/13/2015)  . Hearing loss of both ears   . Liver cancer ([place])    partial liver resection 06/18/2007; partial liver resection 08/29/2010, not renal  . Mixed  hyperlipidemia   . Pneumonia ~ 2012  . Sleep apnea    "doesn't wear mask" (02/13/2015)    SURGICAL HISTORY: Past Surgical History:  Procedure Laterality Date  . CARPAL TUNNEL RELEASE Left 09/10/2013   Procedure: LEFT CARPAL TUNNEL RELEASE;  Surgeon: Cammie Sickle., MD;  Location: [facility];  Service: Orthopedics;  Laterality: Left;  . COLONOSCOPY    . NEPHRECTOMY Left 1980   [place] -cancer  . OPEN PARTIAL HEPATECTOMY   06/18/07   "75%", [facility]  R lobe-cancer  . OPEN PARTIAL HEPATECTOMY   08/29/2010   "20%", [facility]  L lobe    SOCIAL HISTORY: Social History   Socioeconomic History  . Marital status: Married    Spouse name: Not on file  . Number of children: Not on file  . Years of education: Not on file  . Highest education level: Not on file  Occupational History  . Occupation: Retired    Comment: Engineer, building services for [employer]  . Smoking status: Never Smoker  . Smokeless tobacco: Never Used  Substance and Sexual Activity  . Alcohol use: No  . Drug use: No  . Sexual activity: Not on file  Other Topics Concern  . Not on file  Social History Narrative  . Not on file   Social Determinants of Health   Financial Resource Strain:   . Difficulty of Paying Living Expenses: Not on file  Food Insecurity:   . Worried About Estate manager/land agent  of Food in the Last Year: Not on file  . Ran Out of Food in the Last Year: Not on file  Transportation Needs:   . Lack of Transportation (Medical): Not on file  . Lack of Transportation (Non-Medical): Not on file  Physical Activity:   . Days of Exercise per Week: Not on file  . Minutes of Exercise per Session: Not on file  Stress:   . Feeling of Stress : Not on file  Social Connections:   . Frequency of Communication with Friends and Family: Not on file  . Frequency of Social Gatherings with Friends and Family: Not on file  . Attends Religious Services: Not on file  . Active Member of Clubs or  Organizations: Not on file  . Attends Archivist Meetings: Not on file  . Marital Status: Not on file  Intimate Partner Violence:   . Fear of Current or Ex-Partner: Not on file  . Emotionally Abused: Not on file  . Physically Abused: Not on file  . Sexually Abused: Not on file    FAMILY HISTORY: Family History  Problem Relation Age of Onset  . Stroke Father     ALLERGIES:  is allergic to bee venom; hornet venom; tessalon [benzonatate]; tussicaps [hydrocod polst-cpm polst er]; amoxicillin; dilantin [phenytoin]; hydrocodone-chlorpheniramine; mevacor [lovastatin]; prednisone; septra ds [sulfamethoxazole-trimethoprim]; and niacin and related.  MEDICATIONS:  Current Outpatient Medications  Medication Sig Dispense Refill  . aspirin 81 MG tablet Take 81 mg by mouth daily.    . Calcium Citrate 200 MG TABS Take 1 tablet by mouth daily.    . Calcium-Vit D3-Phytosterols [id number] MG-UNIT-MG TABS Take 1 tablet by mouth daily.    . CRESTOR 20 MG tablet Take 1 tablet by mouth every other day.    . docusate sodium (COLACE) 100 MG capsule Take 100 mg by mouth daily.    . finasteride (PROSCAR) 5 MG tablet Take 1 tablet by mouth daily.    . Glucosamine-Chondroitin 250-200 MG TABS Take 1 tablet by mouth daily. Regular strength tabs.    Marland Kitchen ibuprofen (ADVIL,MOTRIN) 200 MG tablet Take 200 mg by mouth every 6 (six) hours as needed.    . meclizine (ANTIVERT) 12.5 MG tablet Take by mouth 3 (three) times daily as needed for dizziness.     . Multiple Vitamin (MULTIVITAMIN WITH MINERALS) TABS tablet Take 1 tablet by mouth daily.    . traMADol (ULTRAM) 50 MG tablet     . vitamin B-12 (CYANOCOBALAMIN) 250 MCG tablet Take 250 mcg by mouth daily.     No current facility-administered medications for this visit.    REVIEW OF SYSTEMS:   Generalized fatigue and weakness using a wheelchair for ambulation over the past 2 weeks Left foot weakness having difficulty with ambulation He is afraid to even  take a shower because of anxiety for the following  PHYSICAL EXAMINATION: ECOG PERFORMANCE STATUS: 3 - Symptomatic, >50% confined to bed  Vitals:   07/27/19 1234  BP: 116/65  Pulse: 81  Resp: 18  Temp: 98.7 F (37.1 C)  SpO2: 96%   There were no vitals filed for this visit.  GENERAL:alert, no distress and comfortable SKIN: skin color, texture, turgor are normal, no rashes or significant lesions EYES: normal, conjunctiva are pink and non-injected, sclera clear OROPHARYNX:no exudate, no erythema and lips, buccal mucosa, and tongue normal  NECK: supple, thyroid normal size, non-tender, without nodularity LYMPH:  no palpable lymphadenopathy in the cervical, axillary or inguinal LUNGS: Markedly diminished breath sounds on  the left HEART: regular rate & rhythm and no murmurs and no lower extremity edema ABDOMEN:abdomen soft, non-tender and normal bowel sounds Musculoskeletal:no cyanosis of digits and no clubbing  PSYCH: alert & oriented x 3 with fluent speech NEURO: Generalized weakness     LABORATORY DATA:  I have reviewed the data as listed Lab Results  Component Value Date   WBC 4.6 07/26/2019   HGB 9.4 (L) 07/26/2019   HCT 29.9 (L) 07/26/2019   MCV 97.1 07/26/2019   PLT 406 (H) 07/26/2019   Lab Results  Component Value Date   NA 134 (L) 07/26/2019   K 3.9 07/26/2019   CL 104 07/26/2019   CO2 22 07/26/2019    RADIOGRAPHIC STUDIES: I have personally reviewed the radiological reports and agreed with the findings in the report.  ASSESSMENT AND PLAN:  Mass of upper lobe of left lung CT chest: Left upper lobe lung mass extending into the anterior mediastinum with a large left pleural effusion and a T12 lytic bone lesion highly suspicious for malignancy. Prior history of hepatocellular carcinoma status post partial hepatectomy surgeries x2  I strongly suspect that the patient may have primary lung cancer. Plan: 1.  Goal of treatment: Palliation patient does not want  to do any aggressive measures but he wants to be kept comfortable. 2.  Thoracentesis is being planned for tomorrow.  The fluid will be sent for cytology and cultures as well as glucose protein and albumin and LDH. 3.  Hospice will be consulted to assist with his care at home. I anticipate that he would have less than 6 months of median survival. 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  Left foot weakness: I discussed with them about MRI of the brain but after much discussion we decided not to do it.  He will not do any radiation therapy or chemotherapy treatments.  We will consult [facility] [place]. I will call the patient next Monday to go over the pathology results.   All questions were answered. The patient knows to call the clinic with any problems, questions or concerns.    Harriette Ohara, MD 07/27/19

## 2019-07-28 ENCOUNTER — Telehealth: Payer: Self-pay | Admitting: Oncology

## 2019-07-28 ENCOUNTER — Ambulatory Visit (HOSPITAL_COMMUNITY)
Admission: RE | Admit: 2019-07-28 | Discharge: 2019-07-28 | Disposition: A | Discharge status: Home / Self Care | Payer: Medicare HMO | Source: Ambulatory Visit | Attending: Hematology and Oncology | Admitting: Hematology and Oncology

## 2019-07-28 ENCOUNTER — Ambulatory Visit (HOSPITAL_COMMUNITY)
Admission: RE | Admit: 2019-07-28 | Discharge: 2019-07-28 | Disposition: A | Discharge status: Home / Self Care | Payer: Medicare HMO | Source: Ambulatory Visit | Attending: Radiology | Admitting: Radiology

## 2019-07-28 ENCOUNTER — Other Ambulatory Visit: Payer: Self-pay | Admitting: *Deleted

## 2019-07-28 DIAGNOSIS — J9811 Atelectasis: Secondary | ICD-10-CM | POA: Diagnosis not present

## 2019-07-28 DIAGNOSIS — J90 Pleural effusion, not elsewhere classified: Secondary | ICD-10-CM | POA: Diagnosis not present

## 2019-07-28 LAB — LACTATE DEHYDROGENASE, PLEURAL OR PERITONEAL FLUID: LD, Fluid: 190 U/L — ABNORMAL HIGH (ref 3–23)

## 2019-07-28 LAB — GLUCOSE, PLEURAL OR PERITONEAL FLUID: Glucose, Fluid: 120 mg/dL

## 2019-07-28 LAB — ALBUMIN, PLEURAL OR PERITONEAL FLUID: Albumin, Fluid: 1.7 g/dL

## 2019-07-28 LAB — PROTEIN, PLEURAL OR PERITONEAL FLUID: Total protein, fluid: 3.4 g/dL

## 2019-07-28 LAB — URINE CULTURE: Culture: NO GROWTH

## 2019-07-28 MED ORDER — LIDOCAINE HCL 1 % IJ SOLN
INTRAMUSCULAR | Status: AC
  Filled 2019-07-28: qty 20

## 2019-07-28 NOTE — Procedures (Signed)
PROCEDURE SUMMARY:  Successful US guided left thoracentesis. Yielded 2 L of blood tinged fluid. Pt tolerated procedure well. No immediate complications.  Specimen was sent for labs. CXR ordered.  EBL < 5 mL  Ascencion Dike PA-C 07/28/2019 3:14 PM

## 2019-07-28 NOTE — Telephone Encounter (Signed)
I TALK WITH PATIENT REGARDING 1/18

## 2019-07-30 ENCOUNTER — Telehealth: Payer: Self-pay | Admitting: Hematology and Oncology

## 2019-07-30 ENCOUNTER — Other Ambulatory Visit: Payer: Self-pay

## 2019-08-01 NOTE — Progress Notes (Signed)
  HEMATOLOGY-ONCOLOGY TELEPHONE VISIT PROGRESS NOTE  I connected with Corey Davila on 08/02/2019 at  2:30 PM EST by telephone and verified that I am speaking with the correct person using two identifiers.  I discussed the limitations, risks, security and privacy concerns of performing an evaluation and management service by telephone and the availability of in person appointments.  I also discussed with the patient that there may be a patient responsible charge related to this service. The patient expressed understanding and agreed to proceed.   History of Present Illness: Corey Davila is a 86 y.o. male with above-mentioned history of hepatocellular carcinoma, renal cell carcinoma, and recent malignant pleural effusion. He underwent a thoracentesis on 07/28/19. He presents over the phone today to discuss the pathology report and his treatment plan.   Observations/Objective:  Continuing to have a dry cough although not as much as it used to be when he had the fluid in the lungs   Assessment Plan:  Malignant pleural effusion CT chest: Left upper lobe lung mass extending into the anterior mediastinum with a large left pleural effusion and a T12 lytic bone lesion highly suspicious for malignancy. Prior history of hepatocellular carcinoma status post partial hepatectomy surgeries x2  Thoracentesis: 2 L of bloody fluid removed 07/27/2018 Pathology: Preliminary results show mesothelial cells without any evidence of malignancy.  It could be a sampling error.  Left foot weakness: We decided not to perform imaging studies because the goal of treatment is palliation. Patient does not wish to undergo any radiation or systemic treatments. Plan: [facility].  If the breathing gets worse they will be reaching out to me to set up for another thoracentesis. He will be seen by [place] on an as-needed basis. For the past week he has gotten much worse with increased fatigue and generalized weakness and  he has fallen couple of times as well.   I discussed the assessment and treatment plan with the patient. The patient was provided an opportunity to ask questions and all were answered. The patient agreed with the plan and demonstrated an understanding of the instructions. The patient was advised to call back or seek an in-person evaluation if the symptoms worsen or if the condition fails to improve as anticipated.   I provided 15 minutes of non-face-to-face time during this encounter.   Rulon Eisenmenger, MD 08/02/2019    I, Molly Dorshimer, am acting as scribe for Nicholas Lose, MD.  I have reviewed the above documentation for accuracy and completeness, and I agree with the above.

## 2019-08-02 ENCOUNTER — Inpatient Hospital Stay (HOSPITAL_BASED_OUTPATIENT_CLINIC_OR_DEPARTMENT_OTHER): Payer: Medicare HMO | Admitting: Hematology and Oncology

## 2019-08-02 DIAGNOSIS — J910 Malignant pleural effusion: Secondary | ICD-10-CM | POA: Diagnosis not present

## 2019-08-02 LAB — CYTOLOGY - NON PAP

## 2019-08-02 NOTE — Assessment & Plan Note (Signed)
CT chest: Left upper lobe lung mass extending into the anterior mediastinum with a large left pleural effusion and a T12 lytic bone lesion highly suspicious for malignancy. Prior history of hepatocellular carcinoma status post partial hepatectomy surgeries x2  Thoracentesis: 2 L of bloody fluid removed 07/27/2018 Pathology:  Left foot weakness: We decided not to perform imaging studies because the goal of treatment is palliation. Patient does not wish to undergo any radiation or systemic treatments. Plan: [facility].

## 2019-09-13 DEATH — deceased
